# Patient Record
Sex: Male | Born: 1950 | Race: White | Hispanic: No | Marital: Single | Smoking: Former smoker
Health system: Southern US, Community
[De-identification: ages and names within clinical notes are randomized; demographics above are authoritative.]

## PROBLEM LIST (undated history)

## (undated) DIAGNOSIS — F319 Bipolar disorder, unspecified: Secondary | ICD-10-CM

## (undated) DIAGNOSIS — N179 Acute kidney failure, unspecified: Secondary | ICD-10-CM

## (undated) DIAGNOSIS — E785 Hyperlipidemia, unspecified: Secondary | ICD-10-CM

## (undated) DIAGNOSIS — E039 Hypothyroidism, unspecified: Secondary | ICD-10-CM

## (undated) HISTORY — DX: Bipolar disorder, unspecified: F31.9

## (undated) HISTORY — DX: Hypothyroidism, unspecified: E03.9

## (undated) HISTORY — DX: Acute kidney failure, unspecified: N17.9

## (undated) HISTORY — PX: OTHER SURGICAL HISTORY: SHX169

## (undated) HISTORY — DX: Hyperlipidemia, unspecified: E78.5

---

## 2005-03-17 ENCOUNTER — Inpatient Hospital Stay (HOSPITAL_COMMUNITY): Admission: EM | Admit: 2005-03-17 | Discharge: 2005-03-23 | Payer: Self-pay | Admitting: Emergency Medicine

## 2007-09-16 ENCOUNTER — Emergency Department (HOSPITAL_COMMUNITY): Admission: EM | Admit: 2007-09-16 | Discharge: 2007-09-16 | Payer: Self-pay | Admitting: Emergency Medicine

## 2009-12-27 ENCOUNTER — Inpatient Hospital Stay (HOSPITAL_COMMUNITY)
Admission: EM | Admit: 2009-12-27 | Discharge: 2010-01-13 | Payer: Self-pay | Source: Home / Self Care | Attending: Internal Medicine | Admitting: Internal Medicine

## 2009-12-27 ENCOUNTER — Ambulatory Visit: Payer: Self-pay | Admitting: Internal Medicine

## 2010-01-06 DIAGNOSIS — F3112 Bipolar disorder, current episode manic without psychotic features, moderate: Secondary | ICD-10-CM

## 2010-01-12 DIAGNOSIS — F05 Delirium due to known physiological condition: Secondary | ICD-10-CM

## 2010-02-16 ENCOUNTER — Encounter: Payer: Self-pay | Admitting: Internal Medicine

## 2010-03-10 ENCOUNTER — Encounter: Payer: Self-pay | Admitting: Internal Medicine

## 2010-03-31 ENCOUNTER — Encounter: Payer: Self-pay | Admitting: Internal Medicine

## 2010-04-20 LAB — URINALYSIS, ROUTINE W REFLEX MICROSCOPIC
Glucose, UA: NEGATIVE mg/dL
Hgb urine dipstick: NEGATIVE
Ketones, ur: NEGATIVE mg/dL
Protein, ur: NEGATIVE mg/dL

## 2010-04-20 LAB — GLUCOSE, CAPILLARY
Glucose-Capillary: 102 mg/dL — ABNORMAL HIGH (ref 70–99)
Glucose-Capillary: 102 mg/dL — ABNORMAL HIGH (ref 70–99)
Glucose-Capillary: 106 mg/dL — ABNORMAL HIGH (ref 70–99)
Glucose-Capillary: 107 mg/dL — ABNORMAL HIGH (ref 70–99)
Glucose-Capillary: 116 mg/dL — ABNORMAL HIGH (ref 70–99)
Glucose-Capillary: 116 mg/dL — ABNORMAL HIGH (ref 70–99)
Glucose-Capillary: 121 mg/dL — ABNORMAL HIGH (ref 70–99)
Glucose-Capillary: 137 mg/dL — ABNORMAL HIGH (ref 70–99)
Glucose-Capillary: 140 mg/dL — ABNORMAL HIGH (ref 70–99)
Glucose-Capillary: 158 mg/dL — ABNORMAL HIGH (ref 70–99)
Glucose-Capillary: 71 mg/dL (ref 70–99)
Glucose-Capillary: 82 mg/dL (ref 70–99)
Glucose-Capillary: 86 mg/dL (ref 70–99)

## 2010-04-20 LAB — LITHIUM LEVEL: Lithium Lvl: <0.25 mEq/L — ABNORMAL LOW (ref 0.80–1.40)

## 2010-04-20 LAB — BASIC METABOLIC PANEL
BUN: 37 mg/dL — ABNORMAL HIGH (ref 6–23)
CO2: 23 mEq/L (ref 19–32)
CO2: 25 mEq/L (ref 19–32)
Calcium: 9.5 mg/dL (ref 8.4–10.5)
Calcium: 9.5 mg/dL (ref 8.4–10.5)
Chloride: 109 mEq/L (ref 96–112)
Chloride: 111 mEq/L (ref 96–112)
Chloride: 114 mEq/L — ABNORMAL HIGH (ref 96–112)
Creatinine, Ser: 2.95 mg/dL — ABNORMAL HIGH (ref 0.4–1.5)
Creatinine, Ser: 3.41 mg/dL — ABNORMAL HIGH (ref 0.4–1.5)
GFR calc Af Amer: 26 mL/min — ABNORMAL LOW (ref >60)
GFR calc Af Amer: 26 mL/min — ABNORMAL LOW (ref >60)
GFR calc Af Amer: 27 mL/min — ABNORMAL LOW (ref >60)
GFR calc non Af Amer: 19 mL/min — ABNORMAL LOW (ref >60)
Glucose, Bld: 123 mg/dL — ABNORMAL HIGH (ref 70–99)
Potassium: 4.2 mEq/L (ref 3.5–5.1)
Sodium: 144 mEq/L (ref 135–145)

## 2010-04-20 LAB — CBC
HCT: 43.9 % (ref 39.0–52.0)
Hemoglobin: 12.5 g/dL — ABNORMAL LOW (ref 13.0–17.0)
Hemoglobin: 13.4 g/dL (ref 13.0–17.0)
MCH: 29.2 pg (ref 26.0–34.0)
MCH: 29.7 pg (ref 26.0–34.0)
MCHC: 30.2 g/dL (ref 30.0–36.0)
MCHC: 30.5 g/dL (ref 30.0–36.0)
MCHC: 31 g/dL (ref 30.0–36.0)
MCHC: 31.8 g/dL (ref 30.0–36.0)
MCV: 93.3 fL (ref 78.0–100.0)
MCV: 94 fL (ref 78.0–100.0)
MCV: 96.6 fL (ref 78.0–100.0)
Platelets: 146 10*3/uL — ABNORMAL LOW (ref 150–400)
Platelets: 175 10*3/uL (ref 150–400)
RBC: 4.21 MIL/uL — ABNORMAL LOW (ref 4.22–5.81)
RBC: 4.58 MIL/uL (ref 4.22–5.81)
RDW: 14 % (ref 11.5–15.5)
RDW: 14.3 % (ref 11.5–15.5)
WBC: 5.7 10*3/uL (ref 4.0–10.5)
WBC: 9.6 10*3/uL (ref 4.0–10.5)

## 2010-04-20 LAB — COMPREHENSIVE METABOLIC PANEL
ALT: 43 U/L (ref 0–53)
AST: 25 U/L (ref 0–37)
Albumin: 2.6 g/dL — ABNORMAL LOW (ref 3.5–5.2)
Alkaline Phosphatase: 109 U/L (ref 39–117)
BUN: 21 mg/dL (ref 6–23)
CO2: 21 mEq/L (ref 19–32)
Calcium: 9.1 mg/dL (ref 8.4–10.5)
Calcium: 9.6 mg/dL (ref 8.4–10.5)
Creatinine, Ser: 3.06 mg/dL — ABNORMAL HIGH (ref 0.4–1.5)
GFR calc Af Amer: 25 mL/min — ABNORMAL LOW (ref >60)
GFR calc non Af Amer: 20 mL/min — ABNORMAL LOW (ref >60)
Potassium: 4.3 mEq/L (ref 3.5–5.1)
Sodium: 144 mEq/L (ref 135–145)
Total Bilirubin: 0.8 mg/dL (ref 0.3–1.2)
Total Protein: 5.3 g/dL — ABNORMAL LOW (ref 6.0–8.3)

## 2010-04-21 ENCOUNTER — Encounter: Payer: Self-pay | Admitting: Internal Medicine

## 2010-04-21 ENCOUNTER — Inpatient Hospital Stay (INDEPENDENT_AMBULATORY_CARE_PROVIDER_SITE_OTHER): Payer: Medicare Other | Admitting: Internal Medicine

## 2010-04-21 DIAGNOSIS — N179 Acute kidney failure, unspecified: Secondary | ICD-10-CM | POA: Insufficient documentation

## 2010-04-21 DIAGNOSIS — R404 Transient alteration of awareness: Secondary | ICD-10-CM | POA: Insufficient documentation

## 2010-04-21 DIAGNOSIS — J9600 Acute respiratory failure, unspecified whether with hypoxia or hypercapnia: Secondary | ICD-10-CM

## 2010-04-21 DIAGNOSIS — Z87891 Personal history of nicotine dependence: Secondary | ICD-10-CM

## 2010-04-21 LAB — BLOOD GAS, ARTERIAL
Acid-Base Excess: 0.2 mmol/L (ref 0.0–2.0)
Acid-Base Excess: 2.9 mmol/L — ABNORMAL HIGH (ref 0.0–2.0)
Acid-Base Excess: 3.5 mmol/L — ABNORMAL HIGH (ref 0.0–2.0)
Bicarbonate: 24.1 mEq/L — ABNORMAL HIGH (ref 20.0–24.0)
Bicarbonate: 25.7 mEq/L — ABNORMAL HIGH (ref 20.0–24.0)
Bicarbonate: 25.9 mEq/L — ABNORMAL HIGH (ref 20.0–24.0)
Bicarbonate: 27.8 mEq/L — ABNORMAL HIGH (ref 20.0–24.0)
Bicarbonate: 28.5 mEq/L — ABNORMAL HIGH (ref 20.0–24.0)
Bicarbonate: 30.2 mEq/L — ABNORMAL HIGH (ref 20.0–24.0)
Bicarbonate: 30.4 mEq/L — ABNORMAL HIGH (ref 20.0–24.0)
Drawn by: 21338
Drawn by: 213381
Drawn by: 30996
FIO2: 0.4 %
FIO2: 0.4 %
FIO2: 1 %
MECHVT: 500 mL
Mode: POSITIVE
O2 Content: 3 L/min
O2 Content: 4 L/min
O2 Content: 6 L/min
O2 Saturation: 96.3 %
O2 Saturation: 96.4 %
O2 Saturation: 96.9 %
O2 Saturation: 99.8 %
O2 Saturation: 99.9 %
PEEP: 5 cmH2O
PEEP: 5 cmH2O
Patient temperature: 100.8
Patient temperature: 98.6
Patient temperature: 98.6
Patient temperature: 98.6
Patient temperature: 98.6
Patient temperature: 98.6
Patient temperature: 98.6
Patient temperature: 98.6
Pressure support: 5 cmH2O
RATE: 20 resp/min
TCO2: 23.3 mmol/L (ref 0–100)
TCO2: 23.7 mmol/L (ref 0–100)
TCO2: 25.2 mmol/L (ref 0–100)
TCO2: 26.7 mmol/L (ref 0–100)
TCO2: 27.1 mmol/L (ref 0–100)
TCO2: 27.4 mmol/L (ref 0–100)
pCO2 arterial: 86.7 mmHg (ref 35.0–45.0)
pH, Arterial: 7.132 — CL (ref 7.350–7.450)
pH, Arterial: 7.225 — ABNORMAL LOW (ref 7.350–7.450)
pH, Arterial: 7.317 — ABNORMAL LOW (ref 7.350–7.450)
pH, Arterial: 7.343 — ABNORMAL LOW (ref 7.350–7.450)
pH, Arterial: 7.348 — ABNORMAL LOW (ref 7.350–7.450)
pH, Arterial: 7.378 (ref 7.350–7.450)
pH, Arterial: 7.396 (ref 7.350–7.450)
pO2, Arterial: 101 mmHg — ABNORMAL HIGH (ref 80.0–100.0)
pO2, Arterial: 377 mmHg — ABNORMAL HIGH (ref 80.0–100.0)
pO2, Arterial: 86.5 mmHg (ref 80.0–100.0)
pO2, Arterial: 92.9 mmHg (ref 80.0–100.0)
pO2, Arterial: 93.8 mmHg (ref 80.0–100.0)

## 2010-04-21 LAB — BASIC METABOLIC PANEL
BUN: 28 mg/dL — ABNORMAL HIGH (ref 6–23)
BUN: 51 mg/dL — ABNORMAL HIGH (ref 6–23)
BUN: 51 mg/dL — ABNORMAL HIGH (ref 6–23)
CO2: 28 mEq/L (ref 19–32)
CO2: 28 mEq/L (ref 19–32)
Calcium: 8.8 mg/dL (ref 8.4–10.5)
Calcium: 9.5 mg/dL (ref 8.4–10.5)
Chloride: 111 mEq/L (ref 96–112)
Chloride: 119 mEq/L — ABNORMAL HIGH (ref 96–112)
Chloride: 121 mEq/L — ABNORMAL HIGH (ref 96–112)
Chloride: 127 mEq/L — ABNORMAL HIGH (ref 96–112)
Creatinine, Ser: 3.34 mg/dL — ABNORMAL HIGH (ref 0.4–1.5)
Creatinine, Ser: 3.54 mg/dL — ABNORMAL HIGH (ref 0.4–1.5)
GFR calc Af Amer: 20 mL/min — ABNORMAL LOW (ref >60)
GFR calc Af Amer: 22 mL/min — ABNORMAL LOW (ref >60)
GFR calc Af Amer: 28 mL/min — ABNORMAL LOW (ref >60)
GFR calc Af Amer: 29 mL/min — ABNORMAL LOW (ref >60)
GFR calc non Af Amer: 16 mL/min — ABNORMAL LOW (ref >60)
GFR calc non Af Amer: 16 mL/min — ABNORMAL LOW (ref >60)
GFR calc non Af Amer: 19 mL/min — ABNORMAL LOW (ref >60)
GFR calc non Af Amer: 20 mL/min — ABNORMAL LOW (ref >60)
GFR calc non Af Amer: 24 mL/min — ABNORMAL LOW (ref >60)
GFR calc non Af Amer: 26 mL/min — ABNORMAL LOW (ref >60)
Glucose, Bld: 112 mg/dL — ABNORMAL HIGH (ref 70–99)
Glucose, Bld: 182 mg/dL — ABNORMAL HIGH (ref 70–99)
Glucose, Bld: 236 mg/dL — ABNORMAL HIGH (ref 70–99)
Potassium: 3.3 mEq/L — ABNORMAL LOW (ref 3.5–5.1)
Potassium: 3.8 mEq/L (ref 3.5–5.1)
Potassium: 3.9 mEq/L (ref 3.5–5.1)
Potassium: 4.1 mEq/L (ref 3.5–5.1)
Potassium: 4.2 mEq/L (ref 3.5–5.1)
Potassium: 4.6 mEq/L (ref 3.5–5.1)
Sodium: 146 mEq/L — ABNORMAL HIGH (ref 135–145)
Sodium: 147 mEq/L — ABNORMAL HIGH (ref 135–145)
Sodium: 148 mEq/L — ABNORMAL HIGH (ref 135–145)
Sodium: 151 mEq/L — ABNORMAL HIGH (ref 135–145)
Sodium: 155 mEq/L — ABNORMAL HIGH (ref 135–145)
Sodium: 161 mEq/L (ref 135–145)

## 2010-04-21 LAB — COMPREHENSIVE METABOLIC PANEL
ALT: 22 U/L (ref 0–53)
ALT: 81 U/L — ABNORMAL HIGH (ref 0–53)
AST: 15 U/L (ref 0–37)
Albumin: 2.8 g/dL — ABNORMAL LOW (ref 3.5–5.2)
Albumin: 3.1 g/dL — ABNORMAL LOW (ref 3.5–5.2)
Alkaline Phosphatase: 107 U/L (ref 39–117)
BUN: 31 mg/dL — ABNORMAL HIGH (ref 6–23)
BUN: 34 mg/dL — ABNORMAL HIGH (ref 6–23)
BUN: 55 mg/dL — ABNORMAL HIGH (ref 6–23)
CO2: 25 mEq/L (ref 19–32)
CO2: 31 mEq/L (ref 19–32)
Calcium: 9.1 mg/dL (ref 8.4–10.5)
Calcium: 9.9 mg/dL (ref 8.4–10.5)
Chloride: 107 mEq/L (ref 96–112)
Chloride: 108 mEq/L (ref 96–112)
Chloride: 113 mEq/L — ABNORMAL HIGH (ref 96–112)
Creatinine, Ser: 3.01 mg/dL — ABNORMAL HIGH (ref 0.4–1.5)
Creatinine, Ser: 3.22 mg/dL — ABNORMAL HIGH (ref 0.4–1.5)
Creatinine, Ser: 3.29 mg/dL — ABNORMAL HIGH (ref 0.4–1.5)
GFR calc Af Amer: 24 mL/min — ABNORMAL LOW (ref >60)
GFR calc Af Amer: 26 mL/min — ABNORMAL LOW (ref >60)
GFR calc non Af Amer: 20 mL/min — ABNORMAL LOW (ref >60)
GFR calc non Af Amer: 22 mL/min — ABNORMAL LOW (ref >60)
Glucose, Bld: 108 mg/dL — ABNORMAL HIGH (ref 70–99)
Glucose, Bld: 114 mg/dL — ABNORMAL HIGH (ref 70–99)
Glucose, Bld: 120 mg/dL — ABNORMAL HIGH (ref 70–99)
Potassium: 5 mEq/L (ref 3.5–5.1)
Sodium: 147 mEq/L — ABNORMAL HIGH (ref 135–145)
Sodium: 149 mEq/L — ABNORMAL HIGH (ref 135–145)
Total Bilirubin: 0.3 mg/dL (ref 0.3–1.2)
Total Bilirubin: 0.7 mg/dL (ref 0.3–1.2)
Total Bilirubin: 1 mg/dL (ref 0.3–1.2)

## 2010-04-21 LAB — POCT CARDIAC MARKERS
CKMB, poc: 4.3 ng/mL (ref 1.0–8.0)
CKMB, poc: 5 ng/mL (ref 1.0–8.0)
Myoglobin, poc: 300 ng/mL (ref 12–200)
Troponin i, poc: <0.05 ng/mL (ref 0.00–0.09)
Troponin i, poc: <0.05 ng/mL (ref 0.00–0.09)

## 2010-04-21 LAB — GLUCOSE, CAPILLARY
Glucose-Capillary: 104 mg/dL — ABNORMAL HIGH (ref 70–99)
Glucose-Capillary: 114 mg/dL — ABNORMAL HIGH (ref 70–99)
Glucose-Capillary: 117 mg/dL — ABNORMAL HIGH (ref 70–99)
Glucose-Capillary: 118 mg/dL — ABNORMAL HIGH (ref 70–99)
Glucose-Capillary: 120 mg/dL — ABNORMAL HIGH (ref 70–99)
Glucose-Capillary: 122 mg/dL — ABNORMAL HIGH (ref 70–99)
Glucose-Capillary: 125 mg/dL — ABNORMAL HIGH (ref 70–99)
Glucose-Capillary: 128 mg/dL — ABNORMAL HIGH (ref 70–99)
Glucose-Capillary: 129 mg/dL — ABNORMAL HIGH (ref 70–99)
Glucose-Capillary: 129 mg/dL — ABNORMAL HIGH (ref 70–99)
Glucose-Capillary: 147 mg/dL — ABNORMAL HIGH (ref 70–99)
Glucose-Capillary: 150 mg/dL — ABNORMAL HIGH (ref 70–99)
Glucose-Capillary: 152 mg/dL — ABNORMAL HIGH (ref 70–99)
Glucose-Capillary: 162 mg/dL — ABNORMAL HIGH (ref 70–99)
Glucose-Capillary: 167 mg/dL — ABNORMAL HIGH (ref 70–99)
Glucose-Capillary: 176 mg/dL — ABNORMAL HIGH (ref 70–99)
Glucose-Capillary: 199 mg/dL — ABNORMAL HIGH (ref 70–99)
Glucose-Capillary: 201 mg/dL — ABNORMAL HIGH (ref 70–99)
Glucose-Capillary: 206 mg/dL — ABNORMAL HIGH (ref 70–99)
Glucose-Capillary: 233 mg/dL — ABNORMAL HIGH (ref 70–99)
Glucose-Capillary: 238 mg/dL — ABNORMAL HIGH (ref 70–99)
Glucose-Capillary: 256 mg/dL — ABNORMAL HIGH (ref 70–99)
Glucose-Capillary: 290 mg/dL — ABNORMAL HIGH (ref 70–99)
Glucose-Capillary: 343 mg/dL — ABNORMAL HIGH (ref 70–99)
Glucose-Capillary: 382 mg/dL — ABNORMAL HIGH (ref 70–99)
Glucose-Capillary: 76 mg/dL (ref 70–99)
Glucose-Capillary: 79 mg/dL (ref 70–99)
Glucose-Capillary: 84 mg/dL (ref 70–99)
Glucose-Capillary: 86 mg/dL (ref 70–99)
Glucose-Capillary: 90 mg/dL (ref 70–99)
Glucose-Capillary: 93 mg/dL (ref 70–99)

## 2010-04-21 LAB — URINE CULTURE: Culture  Setup Time: 201111200243

## 2010-04-21 LAB — CBC
HCT: 36.8 % — ABNORMAL LOW (ref 39.0–52.0)
HCT: 39.7 % (ref 39.0–52.0)
HCT: 40 % (ref 39.0–52.0)
HCT: 40.1 % (ref 39.0–52.0)
HCT: 40.4 % (ref 39.0–52.0)
HCT: 40.6 % (ref 39.0–52.0)
HCT: 43.5 % (ref 39.0–52.0)
Hemoglobin: 12.1 g/dL — ABNORMAL LOW (ref 13.0–17.0)
Hemoglobin: 12.5 g/dL — ABNORMAL LOW (ref 13.0–17.0)
Hemoglobin: 12.7 g/dL — ABNORMAL LOW (ref 13.0–17.0)
Hemoglobin: 12.7 g/dL — ABNORMAL LOW (ref 13.0–17.0)
Hemoglobin: 13 g/dL (ref 13.0–17.0)
Hemoglobin: 13 g/dL (ref 13.0–17.0)
Hemoglobin: 13.1 g/dL (ref 13.0–17.0)
Hemoglobin: 13.9 g/dL (ref 13.0–17.0)
MCH: 29.3 pg (ref 26.0–34.0)
MCH: 29.7 pg (ref 26.0–34.0)
MCH: 29.8 pg (ref 26.0–34.0)
MCH: 30.3 pg (ref 26.0–34.0)
MCHC: 32 g/dL (ref 30.0–36.0)
MCHC: 32 g/dL (ref 30.0–36.0)
MCHC: 32.4 g/dL (ref 30.0–36.0)
MCHC: 32.5 g/dL (ref 30.0–36.0)
MCHC: 33.2 g/dL (ref 30.0–36.0)
MCV: 91.8 fL (ref 78.0–100.0)
MCV: 92.4 fL (ref 78.0–100.0)
MCV: 92.9 fL (ref 78.0–100.0)
MCV: 93.5 fL (ref 78.0–100.0)
MCV: 93.8 fL (ref 78.0–100.0)
MCV: 94.4 fL (ref 78.0–100.0)
Platelets: 168 10*3/uL (ref 150–400)
Platelets: 169 10*3/uL (ref 150–400)
Platelets: ADEQUATE 10*3/uL (ref 150–400)
RBC: 4.07 MIL/uL — ABNORMAL LOW (ref 4.22–5.81)
RBC: 4.2 MIL/uL — ABNORMAL LOW (ref 4.22–5.81)
RBC: 4.28 MIL/uL (ref 4.22–5.81)
RBC: 4.28 MIL/uL (ref 4.22–5.81)
RBC: 4.34 MIL/uL (ref 4.22–5.81)
RBC: 4.34 MIL/uL (ref 4.22–5.81)
RBC: 4.74 MIL/uL (ref 4.22–5.81)
RDW: 14.5 % (ref 11.5–15.5)
RDW: 15.6 % — ABNORMAL HIGH (ref 11.5–15.5)
WBC: 8.1 10*3/uL (ref 4.0–10.5)
WBC: 8.3 10*3/uL (ref 4.0–10.5)
WBC: 9.2 10*3/uL (ref 4.0–10.5)
WBC: 9.2 10*3/uL (ref 4.0–10.5)

## 2010-04-21 LAB — CREATININE, URINE, RANDOM: Creatinine, Urine: 38.1 mg/dL

## 2010-04-21 LAB — LEGIONELLA ANTIGEN, URINE

## 2010-04-21 LAB — CLOSTRIDIUM DIFFICILE EIA

## 2010-04-21 LAB — DIFFERENTIAL
Basophils Absolute: 0 10*3/uL (ref 0.0–0.1)
Lymphocytes Relative: 8 % — ABNORMAL LOW (ref 12–46)
Lymphs Abs: 1.2 10*3/uL (ref 0.7–4.0)
Neutrophils Relative %: 81 % — ABNORMAL HIGH (ref 43–77)

## 2010-04-21 LAB — AMYLASE: Amylase: 149 U/L — ABNORMAL HIGH (ref 0–105)

## 2010-04-21 LAB — PROTIME-INR
INR: 1.41 (ref 0.00–1.49)
Prothrombin Time: 17.5 seconds — ABNORMAL HIGH (ref 11.6–15.2)

## 2010-04-21 LAB — ACETAMINOPHEN LEVEL: Acetaminophen (Tylenol), Serum: <10 ug/mL — ABNORMAL LOW (ref 10–30)

## 2010-04-21 LAB — CULTURE, BLOOD (ROUTINE X 2)

## 2010-04-21 LAB — URINALYSIS, ROUTINE W REFLEX MICROSCOPIC
Bilirubin Urine: NEGATIVE
Glucose, UA: NEGATIVE mg/dL
Ketones, ur: NEGATIVE mg/dL
Leukocytes, UA: NEGATIVE
Specific Gravity, Urine: 1.01 (ref 1.005–1.030)
pH: 6.5 (ref 5.0–8.0)

## 2010-04-21 LAB — LITHIUM LEVEL
Lithium Lvl: 0.79 mEq/L — ABNORMAL LOW (ref 0.80–1.40)
Lithium Lvl: 0.81 mEq/L (ref 0.80–1.40)
Lithium Lvl: 1.07 mEq/L (ref 0.80–1.40)

## 2010-04-21 LAB — CULTURE, BAL-QUANTITATIVE W GRAM STAIN: Colony Count: 1000

## 2010-04-21 LAB — TSH: TSH: 0.325 u[IU]/mL — ABNORMAL LOW (ref 0.350–4.500)

## 2010-04-21 LAB — MRSA PCR SCREENING: MRSA by PCR: NEGATIVE

## 2010-04-21 LAB — PHOSPHORUS
Phosphorus: 4.5 mg/dL (ref 2.3–4.6)
Phosphorus: 6.3 mg/dL — ABNORMAL HIGH (ref 2.3–4.6)

## 2010-04-21 LAB — OSMOLALITY: Osmolality: 315 mOsm/kg — ABNORMAL HIGH (ref 275–300)

## 2010-04-21 LAB — MAGNESIUM: Magnesium: 2.7 mg/dL — ABNORMAL HIGH (ref 1.5–2.5)

## 2010-04-21 LAB — BRAIN NATRIURETIC PEPTIDE: Pro B Natriuretic peptide (BNP): <30 pg/mL (ref 0.0–100.0)

## 2010-04-21 LAB — RAPID URINE DRUG SCREEN, HOSP PERFORMED: Tetrahydrocannabinol: NOT DETECTED

## 2010-04-28 NOTE — Assessment & Plan Note (Signed)
Summary: Bradley Tran   Visit Type:  Hospital Follow-up Primary Provider/Referring Provider:  Jetty Duhamel  CC:  HFU. Pt states his breathing has been doing "good" since starting the proventile. Pt denies any cough. Pt states overall he feels fine.Bradley Tran  History of Present Illness: April 22, 3152 60 year old heavey ex smoker, schizophrenic, and vet. Admitted in ICU nov 2011 with acute hypercapnic resp failure (? AECOPD v pna) and course complicated by renal failure and delirum. Post discharge followed with psych at [facility] and meds adjusted and feels best ever. Renal function last followe dby PMD  and creat was 2.2 (baseline 1.5mg % in 2009, peak creat in hispital 2.5mg %) which is an improvement from discharge but not at baeline. Currently in pulm clinic to followup ICU stay. He feels well. Denies cough, dyspnea, chest pain, wheezing, edema.   Preventive Screening-Counseling & Management  Alcohol-Tobacco     Smoking Status: quit     Smoking Cessation Counseling: yes     Smoke Cessation Stage: quit     Packs/Day: 4.0     Year Started: 1975     Year Quit: 2010     Tobacco Counseling: not to resume use of tobacco products  Allergies (verified): No Known Drug Allergies  Past History:  Past medical, surgical, family and social histories (including risk factors) reviewed, and no changes noted (except as noted below).  Past Medical History: bipolar disease acute renal failure acute respiratory failure hypothyroidism hyperlipidemia Diabetes  Past Surgical History: lymph node removed from right arm  Family History: Reviewed history and no changes required. no family history he is aware of  Social History: Reviewed history from 04/20/2010 and no changes required. Patient states former smoker. quit Jun 21, 2008. 4 ppd. started age 75 single occupation: retired Cabin crew Packs/Day:  4.0  Review of Systems  The patient denies shortness of breath with activity, shortness of  breath at rest, productive cough, non-productive cough, coughing up blood, chest pain, irregular heartbeats, acid heartburn, indigestion, loss of appetite, weight change, abdominal pain, difficulty swallowing, sore throat, tooth/dental problems, headaches, nasal congestion/difficulty breathing through nose, sneezing, itching, ear ache, anxiety, depression, hand/feet swelling, joint stiffness or pain, rash, change in color of mucus, and fever.    Vital Signs:  Patient profile:   60 year old male Height:      64 inches Weight:      154.50 pounds BMI:     26.62 O2 Sat:      93 % on Room air Temp:     99.1 degrees F oral Pulse rate:   113 / minute BP sitting:   106 / 62  (right arm) Cuff size:   large  Vitals Entered By: Charma Igo (April 21, 2010 3:20 PM)  O2 Flow:  Room air CC: HFU. Pt states his breathing has been doing "good" since starting the proventile. Pt denies any cough. Pt states overall he feels fine. Comments meds and allergies updated Mindy Silva  April 21, 2010 3:20 PM    Physical Exam  General:  well developed, well nourished, in no acute distress Head:  normocephalic and atraumatic Eyes:  PERRLA/EOM intact; conjunctiva and sclera clear Ears:  TMs intact and clear with normal canals Nose:  no deformity, discharge, inflammation, or lesions Mouth:  no deformity or lesions Neck:  no masses, thyromegaly, or abnormal cervical nodes Chest Wall:  no deformities noted Lungs:  clear bilaterally to auscultation and percussion Heart:  regular rate and rhythm, S1, S2 without murmurs, rubs,  gallops, or clicks Abdomen:  bowel sounds positive; abdomen soft and non-tender without masses, or organomegaly Msk:  no deformity or scoliosis noted with normal posture Pulses:  pulses normal Extremities:  no clubbing, cyanosis, edema, or deformity noted Neurologic:  CN II-XII grossly intact with normal reflexes, coordination, muscle strength and tone Skin:  intact without lesions or  rashes Cervical Nodes:  no significant adenopathy Axillary Nodes:  no significant adenopathy Psych:  pleasant anxious oral tics of tongue +   CXR  Procedure date:  01/08/2010  Findings:       Clinical Data: Shortness of breath.  Respiratory failure.    PORTABLE CHEST - 1 VIEW    Comparison: 01/05/2010    Findings: Interval removal of feeding tube and endotracheal tube.   Chin overlies the apices minimally.  Minimal motion degradation.   Mild cardiomegaly.  Mild right hemidiaphragm elevation. No pleural   effusion or pneumothorax.  Lung volumes are low.  Improved   bibasilar aeration with mild right base subsegmental atelectasis   remaining.    IMPRESSION:   Cardiomegaly with improved bibasilar aeration.  Mild right base   atelectasis remains.  Left-sided pleural effusion has resolved.    Read By:  Areta Haber,  M.D.   Released By:  Areta Haber,  M.D.  Additional Information  HL7 RESULT STATUS : F  Comments:      independently reviewed  Impression & Recommendations:  Problem # 1:  TOBACCO ABUSE-HISTORY OF (ICD-V15.82) Assessment Unchanged heavy exsmoker. asymptomatic   plan pft to rule out copd Orders: Pulmonary Referral (Pulmonary) Est. Patient Level III [id number])  Problem # 2:  RENAL FAILURE, ACUTE (ICD-584.9) Assessment: Improved  improved from nov 2011 but not at baseline yet. PMD following  Orders: Est. Patient Level III [id number])  Problem # 3:  DELIRIUM (ICD-780.09) Assessment: Improved  resolved. psych at [facility] has adjusted his meds. they are following  Orders: Est. Patient Level III [id number])  Problem # 4:  ACUTE RESPIRATORY FAILURE (ICD-518.81) Assessment: Improved  resolved  plan pft to see if he has copd  Orders: Est. Patient Level III [id number])  Medications Added to Medication List This Visit: 1)  Oxygen 3 Liters  .... At night 2)  Proventil Hfa 108 (90 Base) Mcg/act Aers (Albuterol sulfate) .... 2 puffs at  bedtime  Patient Instructions: 1)  please have breathin test called PFT 2)  return for folowup after breathing test 3)  glad you are better   Immunization History:  Influenza Immunization History:    Influenza:  historical (03/11/2010)  Pneumovax Immunization History:    Pneumovax:  historical (03/12/2010)

## 2010-05-07 ENCOUNTER — Encounter: Payer: Self-pay | Admitting: Internal Medicine

## 2010-05-07 NOTE — Letter (Signed)
Summary: [facility]   Imported By: Phillis Knack 04/27/2010 07:25:24  _____________________________________________________________________  External Attachment:    Type:   Image     Comment:   External Document

## 2010-05-07 NOTE — Letter (Signed)
Summary: [facility]   Imported By: Phillis Knack 04/27/2010 07:24:48  _____________________________________________________________________  External Attachment:    Type:   Image     Comment:   External Document

## 2010-05-07 NOTE — Letter (Signed)
Summary: [facility]   Imported By: Phillis Knack 04/27/2010 07:26:01  _____________________________________________________________________  External Attachment:    Type:   Image     Comment:   External Document

## 2010-05-12 ENCOUNTER — Ambulatory Visit: Payer: Medicare Other | Admitting: Internal Medicine

## 2010-06-26 NOTE — Discharge Summary (Signed)
NAME:  Bradley Tran, Bradley Tran NO.:  [id number]   MEDICAL RECORD NO.:  [id number]          PATIENT TYPE:  INP   LOCATION:  [id number]                         FACILITY:  [facility]   PHYSICIAN:  Cherene Altes, M.D.DATE OF BIRTH:  [birth year]   DATE OF ADMISSION:  03/17/2005  DATE OF DISCHARGE:  03/22/2005                                 DISCHARGE SUMMARY   PRIMARY CARE PHYSICIAN:  Unassigned.   PSYCHIATRIC PHYSICIAN:  Engineer, technical sales, [place],  [place].   DISCHARGE DIAGNOSES:  1.  Lithium toxicity.      1.  Lithium level 3.45 at admission.      2.  Lithium level 0.77 at discharge.  2.  Bipolar disorder.  3.  Normocytic anemia, blood work unrevealing.  4.  Severe dehydration with acute renal insufficiency, resolved.   OUTPATIENT MEDICATIONS:  1.  Lithium 900 mg p.o. nightly.  2.  Risperdal 3 mg p.o. twice daily.  3.  Celexa 20 mg p.o. daily.   FOLLOW UP:  The patient is instructed to keep his scheduled appointment with  his attending psychiatrist at the [facility] in [place].  He is also advised  to obtain a primary care physician for ongoing evaluation of chronic medical  problems.   CONSULTATIONS:  Felizardo Hoffmann, M.D., inpatient psychiatric services.   PROCEDURES:  CT scan of the head March 17, 2005, revealing no definite  acute or focal intracranial abnormalities.   HISTORY OF PRESENT ILLNESS:  Please see dictated H&P labeled # [id number].   HOSPITAL COURSE:  [name] is a very pleasant 60 year old gentleman  with longstanding history of bipolar disorder.  He has been on lithium for  quite some time.  He presented to the hospital on March 17, 2005, with  complaints of altered mental status.  The patient has an elderly father who  assists with his care.  He had noted significant change in the patient's  mental status in the 2 to 3 days prior to his admission.  The patient had  come to a point where he would finally admit that  he was having difficulty,  and, therefore, the patient's father was able to convince him to present to  the emergency room for evaluation.   In the emergency room, he underwent CT scan of the head which revealed no  acute disease.  Lithium level was obtained which was noted to be 3.45.  The  patient was exhibiting symptoms of confusion, lethargy, somnolence, and  tremulousness.  He was also having some nausea, vomiting, and diarrhea.  All  symptoms were felt to be related to the patient's lithium toxicity.  The  patient was admitted to the acute unit.  Lithium was held, of course.  The  patient was hydrated with crystalloid IV fluids.  The patient tolerated the  intervention well.  With gradual decreasing lithium levels, the patient's  mental status improved significantly.  By March 21, 2005, the patient had  returned to his baseline mental status. A consultation was accomplished with  inpatient psychiatry.  A recommendation was made to resume the patient's  lithium at 900 mg nightly and for levels to be obtained 6 days status post  resumption, 10 hours after the patient's dose.  Arrangements are being made  for this to be carried out in the outpatient setting.  The patient is also  scheduled to follow up with his primary psychiatrist the Tuesday following  his discharge (discharge being planned for Monday).   At time of discharge, however, the patient's symptoms had completely  resolved, and he was back to his baseline.  He is alert and oriented and  ambulating without difficulty.   During this hospital stay, it was noted the patient was suffering with a  mild normocytic anemia. The exact etiology of this was not clear.  Stool  guaiac's were ordered but not obtained during the hospital stay.  B12 and  folate levels were obtained and are both in fact normal to high-normal.  Iron panel was obtained and was completely normal.  In that there is no  evidence of an acute iron deficiency,  it is not felt hospitalization should  be prolonged any further to evaluate this.  Given the patient's iron studies  do not suggest iron deficiency, it is not felt an acute colonoscopy would be  indicated.   The patient did have a significant degree of renal insufficiency at time of  his presentation.  It is quite possible the patient has had prerenal  azotemia in the outpatient setting long enough to cause decreased  erythropoietin state causing some mild anemia.  It is recommended the  patient's CBC be followed up in approximately one month.   At the time of admission, the patient was clinically significantly  dehydrated and suffering with acute renal insufficiency.  Creatinine was 1.9  at the time of admission. Baseline creatinine was unknown.  With hydration,  fortunately the patient's renal function improved and returned to normal.  Creatinine at the time of discharge was 1.5.   On March 22, 2005, the patient was cleared for discharge home.  He has  discharge prescription for all of the above-listed medications.  He is  already scheduled to follow up with his primary psychiatrist in [place] at the [facility] and is advised to do so.  The patient's father will assist  him in keeping that appointment.  Case management is also to assist the  patient in obtaining a clinic locally where he can have his lithium level  checked on a regular basis.      Cherene Altes, M.D.  Electronically Signed     JTM/MEDQ  D:  03/21/2005  T:  03/21/2005  Job:  [id number]

## 2010-06-26 NOTE — Discharge Summary (Signed)
NAME:  Bradley Tran, Bradley Tran NO.:  [id number]   MEDICAL RECORD NO.:  [id number]          PATIENT TYPE:  INP   LOCATION:  [id number]                         [facility]ACILITY:  [facility]   PHYSICIAN:  Cyril Mourning, D.O.    DATE OF BIRTH:  Feb 07, 1951   DATE OF ADMISSION:  03/17/2005  DATE OF DISCHARGE:                                 DISCHARGE SUMMARY   ADDENDUM  For full details of hospital course and summary, please see the discharge  summary dictated by Dr. 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 His urine output has been  almost 5 L per day. He is keeping up with oral replacement, however. In any  event, I would recommend weekly follow-up of basic metabolic panel to assure  that he is hydrating appropriately until his diabetes insipidus resolves  over a matter of time.   Additionally he was discovered to have a markedly elevated TSH of 119  perhaps also related to Lithium toxicity that can be associated with  hypothyroidism.  He has been initiated on Synthroid 32mcg daily with  recommendations to follow up his TSH within 1 month of discharge.   He appears to be doing quite well clinically ambulating and showering on his  own and should he have any questions or problems about this hospitalization  he has been provided our contact information.   LABORATORY DATA PRIOR TO DISCHARGE:  Includes a sodium of 142, potassium  4.4, BUN 10, creatinine of 1.6. Urine osmolarity of 155, fractional  excretion of sodium is greater than 1%.   In any event, again, Bradley Tran needs to follow up with a primary care  physician and have weekly basic metabolic panels until this issue resolves.  Otherwise, he has a high  likelihood for re-presentation to the hospital with  dehydration and acute renal failure.      Cyril Mourning, D.O.  Electronically Signed     ESS/MEDQ  D:  03/23/2005  T:  03/23/2005  Job:  [id number]   cc:   Felizardo Hoffmann, M.D.

## 2010-06-26 NOTE — H&P (Signed)
NAME:  Bradley Tran NO.:  [id number]   MEDICAL RECORD NO.:  [id number]          Tran TYPE:  EMS   LOCATION:  ED                           FACILITY:  [facility]   PHYSICIAN:  Cherene Altes, M.D.DATE OF BIRTH:  [birth year]   DATE OF ADMISSION:  03/17/2005  DATE OF DISCHARGE:                                HISTORY & PHYSICAL   PRIMARY CARE PHYSICIAN:  Unassigned.   CHIEF COMPLAINT:  Altered mental status.   HISTORY OF PRESENT ILLNESS:  Bradley Tran is a 60 year old gentleman with  a known history of bipolar disorder for which he is treated with Lithium.  Bradley Tran's elderly father assists in his care, though Bradley Tran lives  independently at [facility] in [place]. Bradley Tran's  father reports that Bradley Tran has been confused and shaky for at least 3  to 4 days.  He has been trying to convince his son to seek help.  Today Bradley  Tran's son felt sufficiently bad that he himself decided he did need some  help.  He called EMS.  At that time apparently he was saying, my sodium is  bad.  He reports through his father complaints of tremulousness, confusion,  slurred speech, stumbling gait, and intermittent diarrhea.  At Bradley time of  my evaluation, however, his speech is unintelligible.  Bradley Tran is  markedly somnolent.  Bradley Tran's father states that he has had these  symptoms for at least 2 to 3 days, but he has had them previously during  bouts of lithium toxicity.  No further history is able to be accomplished.   REVIEW OF SYSTEMS:  Comprehensive Review of Systems is not able to be  accomplished as Bradley Tran is significantly altered in his mental status.   PAST MEDICAL HISTORY:  1.  Bipolar disorder treated through [facility] in [place].  2.  No further medical history is able to be gleaned from Bradley Tran due to      his altered mental status.   MEDICATIONS:  1.  Lithium, dose unknown.  2.   Risperdal 3 mg p.o. twice daily.  3.  Celexa 20 mg p.o. q.a.m.  4.  Vardenafil 20 mg p.r.n.   ALLERGIES:  COGENTIN.   FAMILY HISTORY:  Noncontributory to this admission.   SOCIAL HISTORY:  Bradley Tran apparently does drink and does smoke, but Bradley  extent to which he does either is not clear.  He lives independently at  [facility].   DATA REVIEW:  White count is elevated at 15.7, hemoglobin low at 12.5, with  MCV of 97.  Electrolytes are balanced.  BUN is normal at 13, creatinine  elevated at 1.9.  LFTs are normal except alkaline phosphatase which is  elevated at 160. Lithium level is markedly elevated at 3.45.  Ammonia level  is normal.   Chest x-Bhat reveals no acute disease.   Urinalysis reveals trace leukocyte esterase with only 0 to 2 white blood  cells.  Coags are normal.  Alcohol level is less than  5.   CT scan of Bradley head reveals no acute disease.   Bradley pH was 7.4, pCO2 55, pO2 93.   PHYSICAL EXAMINATION:  VITAL SIGNS: Temperature 99.9, blood pressure 117/77,  heart rate 73, respiratory rate 20, O2 saturation 93% on room air.  CBG is  199.  GENERAL:  Disheveled, poorly kempt gentleman who is slurring his speech and  very lethargic but in no acute respiratory distress.  HEENT:  Eyelashes appear to be singed bilaterally, but there is no evidence  of cutaneous trauma or burn.  NECK:  No JVD.  LUNGS: Clear to auscultation bilaterally without wheeze or rhonchi.  CARDIOVASCULAR: Regular rate and rhythm without murmur, gallop, or rub.  ABDOMEN:  Nontender, nondistended.  Bowel sounds present.  No  hepatosplenomegaly, rebound, or ascites.  EXTREMITIES: No significant cyanosis, clubbing, or edema bilateral lower  extremities.  CUTANEOUS: Bradley Tran is very poorly kempt and is, in fact, quite dirty at  present.  Fingernails are nicotine stained bilaterally.  NEUROLOGIC: Cranial nerves II-XII do appear to be intact. Bradley Tran is  moving all four extremities spontaneously.   There is a slight tremor  appreciable with all attempts to move.  There is no posturing.  There is no  Babinski.  Bradley Tran is alert, lethargic, but is clearly not oriented.   IMPRESSION AND PLAN:  1.  Lithium toxicity: Bradley Tran is suffering with a significant lithium      toxicity.  He displays Bradley classic symptoms of tremor, twitch, ataxia,      delirium, nausea, vomiting, diarrhea, and leukocytosis.  Thus far, we      have been fortunate in that there has been no evidence of bradycardia,      hypotension, or seizure activity.  This has likely all been exacerbated      by Bradley Tran's renal insufficiency.  I am not aware of Bradley baseline      status of his renal function.  Bradley Tran will be admitted for close      observation and IV fluid administration.  We will keep him on seizure      precautions.  We will monitor him on telemetry out of fear of      bradycardia and hypotension.  At present, it does not appear that      hemodialysis will be necessary.  Will follow lithium level in Bradley      morning as well as renal function.  If his renal function has not      improved, we may, unfortunately, need to discontinue lithium      permanently.  2.  Renal insufficiency: I do not have baseline creatinine despite a search      of Bradley computer system.  Creatinine at present is 1.9.  I am hopeful      this is simply a mild prerenal azotemia.  We will hydrate Bradley Tran as      discussed above and follow up renal function in Bradley morning.  3.  Normocytic anemia: If Bradley Tran's renal function is chronically 1.9,      Bradley Tran's anemia may simply be a result of decreased erythropoietin      state.  I will check B12, folate, and iron studies as this could, in      fact, be a mixed anemia.  We will guaiac stools.  4.  Bipolar disorder: We can continue Bradley Tran's Risperdal, but we will      have to hold his  Lithium for clear reasons.     Cherene Altes, M.D.  Electronically  Signed    JTM/MEDQ  D:  03/17/2005  T:  03/17/2005  Job:  [id number]

## 2010-11-06 LAB — POCT I-STAT, CHEM 8
BUN: 9
Calcium, Ion: 1.29
Chloride: 103
HCT: 34 — ABNORMAL LOW
Potassium: 4.1

## 2010-11-06 LAB — DIFFERENTIAL
Basophils Absolute: 0
Basophils Relative: 0
Lymphocytes Relative: 11 — ABNORMAL LOW
Monocytes Absolute: 0.5
Monocytes Relative: 4
Neutro Abs: 9.3 — ABNORMAL HIGH
Neutrophils Relative %: 81 — ABNORMAL HIGH

## 2010-11-06 LAB — URINALYSIS, ROUTINE W REFLEX MICROSCOPIC
Protein, ur: NEGATIVE
Specific Gravity, Urine: 1.005
Urobilinogen, UA: 0.2

## 2010-11-06 LAB — CBC
Platelets: 248
RBC: 3.43 — ABNORMAL LOW
WBC: 11.6 — ABNORMAL HIGH

## 2011-02-08 ENCOUNTER — Encounter (HOSPITAL_COMMUNITY): Payer: Self-pay | Admitting: Emergency Medicine

## 2011-02-08 ENCOUNTER — Emergency Department (HOSPITAL_COMMUNITY)
Admission: EM | Admit: 2011-02-08 | Discharge: 2011-02-10 | Disposition: A | Discharge status: Other Acute Inpatient Hospital | Payer: Medicare Other | Attending: Emergency Medicine | Admitting: Emergency Medicine

## 2011-02-08 DIAGNOSIS — F29 Unspecified psychosis not due to a substance or known physiological condition: Secondary | ICD-10-CM | POA: Insufficient documentation

## 2011-02-08 DIAGNOSIS — Z79899 Other long term (current) drug therapy: Secondary | ICD-10-CM | POA: Insufficient documentation

## 2011-02-08 DIAGNOSIS — E039 Hypothyroidism, unspecified: Secondary | ICD-10-CM | POA: Insufficient documentation

## 2011-02-08 DIAGNOSIS — F3289 Other specified depressive episodes: Secondary | ICD-10-CM | POA: Insufficient documentation

## 2011-02-08 DIAGNOSIS — F329 Major depressive disorder, single episode, unspecified: Secondary | ICD-10-CM | POA: Insufficient documentation

## 2011-02-08 NOTE — ED Notes (Signed)
[id number] Expected date:02/08/11<BR> Expected time:10:53 PM<BR> Means of arrival:Ambulance<BR> Comments:<BR> EMS 50 GC, blood tinged cough , fever

## 2011-02-08 NOTE — ED Notes (Signed)
Per the police, the patient called 911 yesterday because he said his neighbor was "spoofing" him.   Has ivc papers for danger to self and others.

## 2011-02-09 LAB — COMPREHENSIVE METABOLIC PANEL
AST: 16 U/L (ref 0–37)
BUN: 30 mg/dL — ABNORMAL HIGH (ref 6–23)
CO2: 24 mEq/L (ref 19–32)
Calcium: 10 mg/dL (ref 8.4–10.5)
Chloride: 102 mEq/L (ref 96–112)
Creatinine, Ser: 1.8 mg/dL — ABNORMAL HIGH (ref 0.50–1.35)
GFR calc Af Amer: 46 mL/min — ABNORMAL LOW (ref >90)
GFR calc non Af Amer: 40 mL/min — ABNORMAL LOW (ref >90)
Glucose, Bld: 106 mg/dL — ABNORMAL HIGH (ref 70–99)
Total Bilirubin: 0.3 mg/dL (ref 0.3–1.2)

## 2011-02-09 LAB — DIFFERENTIAL
Basophils Absolute: 0 10*3/uL (ref 0.0–0.1)
Eosinophils Relative: 1 % (ref 0–5)
Lymphocytes Relative: 22 % (ref 12–46)
Lymphs Abs: 2.7 10*3/uL (ref 0.7–4.0)
Monocytes Absolute: 1.1 10*3/uL — ABNORMAL HIGH (ref 0.1–1.0)
Monocytes Relative: 9 % (ref 3–12)
Neutro Abs: 8.1 10*3/uL — ABNORMAL HIGH (ref 1.7–7.7)

## 2011-02-09 LAB — RAPID URINE DRUG SCREEN, HOSP PERFORMED
Cocaine: NOT DETECTED
Opiates: NOT DETECTED
Tetrahydrocannabinol: NOT DETECTED

## 2011-02-09 LAB — CBC
HCT: 36.3 % — ABNORMAL LOW (ref 39.0–52.0)
Hemoglobin: 12.4 g/dL — ABNORMAL LOW (ref 13.0–17.0)
MCV: 89.6 fL (ref 78.0–100.0)
RBC: 4.05 MIL/uL — ABNORMAL LOW (ref 4.22–5.81)
RDW: 13.1 % (ref 11.5–15.5)
WBC: 12.1 10*3/uL — ABNORMAL HIGH (ref 4.0–10.5)

## 2011-02-09 MED ORDER — ONDANSETRON HCL 4 MG PO TABS
4.0000 mg | ORAL_TABLET | Freq: Three times a day (TID) | ORAL | Status: DC | PRN
  Filled 2011-02-09: qty 1

## 2011-02-09 MED ORDER — ACETAMINOPHEN 325 MG PO TABS: 650.0000 mg | ORAL_TABLET | ORAL | Status: DC | PRN

## 2011-02-09 MED ORDER — NICOTINE 21 MG/24HR TD PT24: 21.0000 mg | MEDICATED_PATCH | Freq: Every day | TRANSDERMAL | Status: DC

## 2011-02-09 MED ORDER — ZIPRASIDONE MESYLATE 20 MG IM SOLR: 20.0000 mg | INTRAMUSCULAR | Status: DC | PRN

## 2011-02-09 MED ORDER — HALOPERIDOL LACTATE 5 MG/ML IJ SOLN
5.0000 mg | Freq: Once | INTRAMUSCULAR | Status: AC
  Administered 2011-02-09: 5 mg via INTRAMUSCULAR

## 2011-02-09 MED ORDER — HALOPERIDOL LACTATE 5 MG/ML IJ SOLN
INTRAMUSCULAR | Status: AC
  Administered 2011-02-09: 5 mg
  Filled 2011-02-09: qty 1

## 2011-02-09 MED ORDER — ALUM & MAG HYDROXIDE-SIMETH 200-200-20 MG/5ML PO SUSP: 30.0000 mL | ORAL | Status: DC | PRN

## 2011-02-09 MED ORDER — ZIPRASIDONE HCL 20 MG PO CAPS
40.0000 mg | ORAL_CAPSULE | Freq: Two times a day (BID) | ORAL | Status: DC
  Administered 2011-02-09 – 2011-02-10 (×3): 40 mg via ORAL
  Filled 2011-02-09 (×3): qty 2

## 2011-02-09 MED ORDER — LORAZEPAM 1 MG PO TABS
2.0000 mg | ORAL_TABLET | ORAL | Status: DC | PRN
  Administered 2011-02-10 (×2): 2 mg via ORAL
  Filled 2011-02-09 (×2): qty 2

## 2011-02-09 MED ORDER — LORAZEPAM 1 MG PO TABS: 1.0000 mg | ORAL_TABLET | Freq: Three times a day (TID) | ORAL | Status: DC | PRN

## 2011-02-09 NOTE — ED Provider Notes (Signed)
Patient resting comfortably with no complaints.  Dot Lanes, MD 02/09/11 2226
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
## 2011-02-09 NOTE — ED Notes (Signed)
Pt very upset and getting loud with staff. Security and charge nurse called. Pt attempted to walk out of TCU. [employer] at bedside with security. Called psych ED and report given.

## 2011-02-09 NOTE — ED Notes (Signed)
ACT in with pt will draw labs when assessment complete

## 2011-02-09 NOTE — ED Notes (Signed)
Medicated with 5 mg Haldol IM in right arm due to increased agitation. Pt allowed it and tolerated well.

## 2011-02-09 NOTE — ED Notes (Signed)
Worried about father, called home and the line is busy several times, asked [employer] officer to see if he could help out and officer called [employer] office to have them check on father, awaiting reply from [facility]

## 2011-02-09 NOTE — ED Provider Notes (Signed)
History     CSN: [id number]  Arrival date & time 02/08/11  2247   First MD Initiated Contact with Patient 02/09/11 0021      Chief Complaint  Patient presents with  . ivc, danger to self and others     (Consider location/radiation/quality/duration/timing/severity/associated sxs/prior treatment) HPI Comments: Patient with history of bipolar disorder per chart -- presents under police custody with IVC paperwork completed. Please say that the patient was intoxicated last night when they were at his house. Patient has continued to cause a disturbance. Patient states that he is on Klonopin currently and an antidepressant for his psychological issues. He denies any current medical complaints. Level V caviat applies due to psychiatric illness.  The history is provided by the police.    Past Medical History  Diagnosis Date  . Hyperlipidemia   . Hypothyroidism   . Bipolar disorder, unspecified   . Acute renal failure   . Diabetes mellitus     Past Surgical History  Procedure Date  . Other surgical history     lymph node removed from right arm    History reviewed. No pertinent family history.  History  Substance Use Topics  . Smoking status: Former Smoker -- 4.0 packs/day for 30 years    Types: Cigarettes    Quit date: 06/11/2008  . Smokeless tobacco: Not on file  . Alcohol Use: Yes     last night. daily      Review of Systems  Unable to perform ROS: Psychiatric disorder    Allergies  Review of patient's allergies indicates no known allergies.  Home Medications   Current Outpatient Rx  Name Route Sig Dispense Refill  . ALBUTEROL SULFATE HFA 108 (90 BASE) MCG/ACT IN AERS Inhalation Inhale 2 puffs into the lungs at bedtime.      Marland Kitchen CITALOPRAM HYDROBROMIDE 40 MG PO TABS  1/2 tab by mouth once daily     . CLONAZEPAM 0.5 MG PO TABS Oral Take by mouth 2 (two) times daily.      Marland Kitchen LEVOTHYROXINE SODIUM 150 MCG PO TABS Oral Take 150 mcg by mouth daily.        BP 155/99   Pulse 93  Temp 98.1 F (36.7 C)  Resp 20  SpO2 100%  Physical Exam  Nursing note and vitals reviewed. Constitutional: He is oriented to person, place, and time. He appears well-developed and well-nourished.  HENT:  Head: Normocephalic and atraumatic.  Eyes: Conjunctivae are normal. Pupils are equal, round, and reactive to light. Right eye exhibits no discharge. Left eye exhibits no discharge.  Neck: Normal range of motion. Neck supple.  Cardiovascular: Normal rate, regular rhythm and normal heart sounds.   Pulmonary/Chest: Effort normal and breath sounds normal.  Abdominal: Soft. Bowel sounds are normal. There is no tenderness. There is no rebound and no guarding.  Musculoskeletal: He exhibits no edema.  Neurological: He is alert and oriented to person, place, and time.  Skin: Skin is warm and dry.  Psychiatric: His speech is rapid and/or pressured.    ED Course  Procedures (including critical care time)  Labs Reviewed  CBC - Abnormal; Notable for the following:    WBC 12.1 (*)    RBC 4.05 (*)    Hemoglobin 12.4 (*)    HCT 36.3 (*)    All other components within normal limits  DIFFERENTIAL - Abnormal; Notable for the following:    Neutro Abs 8.1 (*)    Monocytes Absolute 1.1 (*)    All  other components within normal limits  COMPREHENSIVE METABOLIC PANEL  ETHANOL  URINE RAPID DRUG SCREEN (HOSP PERFORMED)   No results found.   1. Depression     1:57 AM patient seen and examined. Workup pending. Patient is under involuntary commitment.  2:51 AM ACT has seen.   MDM  IVC, pending ACT eval.         Faustino Congress, PA 02/09/11 [phone]

## 2011-02-09 NOTE — ED Notes (Signed)
PA at bedside to talk with pt.

## 2011-02-09 NOTE — ED Provider Notes (Signed)
Medical screening examination/treatment/procedure(s) were performed by non-physician practitioner and as supervising physician I was immediately available for consultation/collaboration.   Sharyon Cable, MD 02/09/11 [phone]

## 2011-02-09 NOTE — ED Provider Notes (Signed)
Pt seen in psych ED this morning.  Pt is under IVC.  Has had telepsych recommendations of d/c celexa.  Geodon 20mg  IV q4 hours prn not to exceed 40mg  in 24 hours, geodon 40mg  po BID, ativan 2mg  po q4-6 hours prn agititation.  Pt is standing in his room, pacing, but otherwise cooperative.    Threasa Beards, MD 02/09/11 [phone]

## 2011-02-09 NOTE — ED Notes (Signed)
Pt brought over from ED and placed in room 32 and made comfortable.

## 2011-02-10 ENCOUNTER — Other Ambulatory Visit: Payer: Self-pay

## 2011-02-10 DIAGNOSIS — F329 Major depressive disorder, single episode, unspecified: Secondary | ICD-10-CM

## 2011-02-10 DIAGNOSIS — F3289 Other specified depressive episodes: Secondary | ICD-10-CM

## 2011-02-10 MED ORDER — LEVOTHYROXINE SODIUM 150 MCG PO TABS
150.0000 ug | ORAL_TABLET | Freq: Once | ORAL | Status: AC
  Administered 2011-02-10: 150 ug via ORAL
  Filled 2011-02-10: qty 1

## 2011-02-10 MED ORDER — AMLODIPINE BESYLATE 5 MG PO TABS
5.0000 mg | ORAL_TABLET | Freq: Every day | ORAL | Status: DC
  Administered 2011-02-10: 5 mg via ORAL
  Filled 2011-02-10: qty 1

## 2011-02-10 NOTE — ED Provider Notes (Addendum)
Pt is IVC, psychosis.  Admitted to [facility], but no beds currently available.  Vitals stable, resting currently.    Saddie Benders. Jandy Brackens, MD 02/10/11 0825      11:08 AM Pt is accepted to [facility] by Dr. Wilber Oliphant, however no ride available until later today.     Saddie Benders. Johnasia Liese, MD 02/10/11 1108

## 2011-02-10 NOTE — ED Notes (Signed)
Pt has increasing anxiety this morning, asking to speak to police officer to contest his IVC, but alternately states that this is a wonderful facility and that he is happy here. [employer] on duty speaks to pt in room, pt is satisfied.
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
## 2011-02-10 NOTE — ED Notes (Signed)
First Examination was not completed. Completed and had EDP sign for pt to be transported via [facility] to [facility].

## 2011-02-10 NOTE — Discharge Planning (Signed)
Patient has been accepted to [facility] by Dr. Wilber Oliphant. Report to be called to [facility] [phone]. Patient to be transported by [facility] to [facility], [place]. Patient's nurse has been notified of disposition.  Laurena Spies , MSW, LCSWA 02/10/2011 10:53 AM  [phone]

## 2011-02-10 NOTE — ED Notes (Signed)
TC to [facility] and informed no beds per [facility].  TC to [facility] and spoke with Shirlean Mylar, who stated they had no beds today.  TC to [facility] & informed no beds currently, but possibly after 1:00pm after discharges.  TC to [facility] spoke with Sharyn Lull who stated they do have beds. Info faxed to [facility] for review. TC to [facility] spoke with Juliann Pulse who stated they have beds today.

## 2012-02-13 IMAGING — CR DG CHEST 1V PORT
1 series · 1 of 1 positions shown · non-contrast
Comparison: 09/16/2007.

CLINICAL DATA: 59-year-old male cough, sore throat, shortness of
breath.

PORTABLE CHEST - 1 VIEW

[view not recorded]
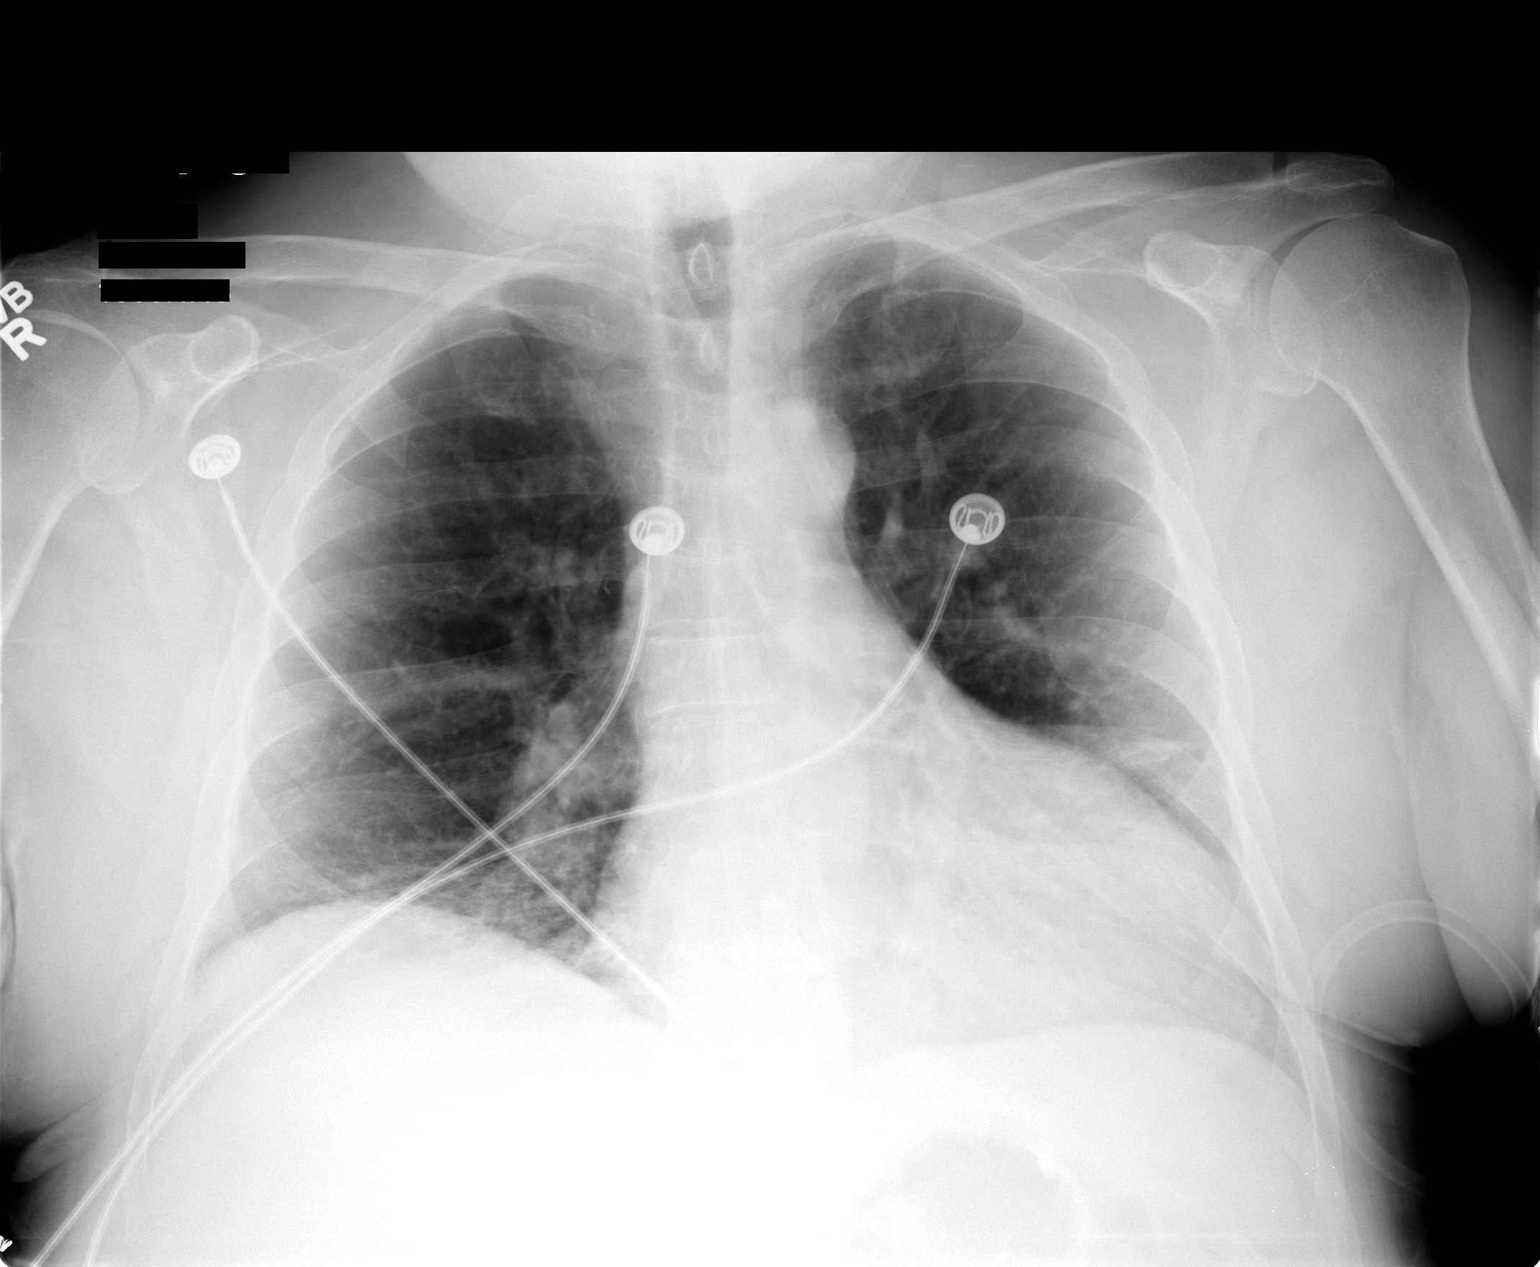

[1 of 1 positions shown; findings below may reference images not displayed]

FINDINGS: Portable semi upright AP view 1900 hours.  Mild
cardiomegaly suggested and appears probably increased since 2770.
Other mediastinal contours are within normal limits.   No
pneumothorax, pulmonary edema, pleural effusion or consolidation.
Linear streaky opacity at the left midlung and right lung base.
Visualized tracheal air column is within normal limits.
IMPRESSION: 1.  Mild cardiomegaly, probably increase in 2770.
2.  Atelectasis.

## 2012-02-13 IMAGING — CT CT HEAD W/O CM
1 series · 16 of 30 positions shown, 20 images · non-contrast
Comparison: CT head without contrast 03/17/2005

CLINICAL DATA: Cough.  Sore throat.  Medical clearance.  Possible
drug overdose.

CT HEAD WITHOUT CONTRAST
TECHNIQUE: Contiguous axial images were obtained from the base of
the skull through the vertex without contrast.

[Series 2: headseq 4.8 h45s · axial · 0.43mm/px · z∈[-84,+73]mm · 16 of 36 slices shown, 20 images]
[im 2/36  brain]
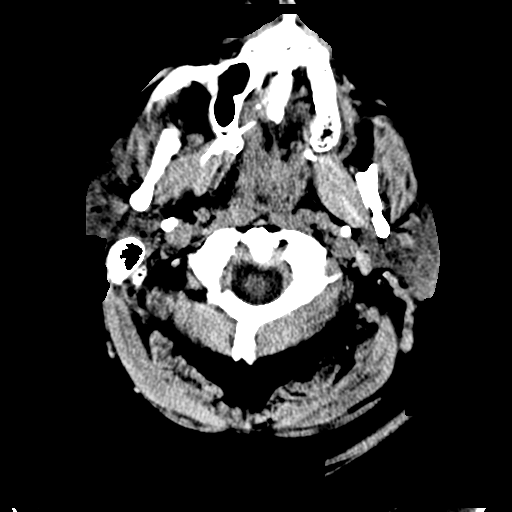
[im 2/36  bone]
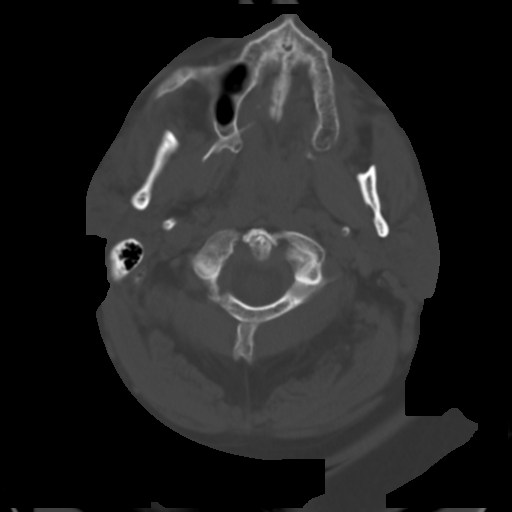
[im 4/36  brain]
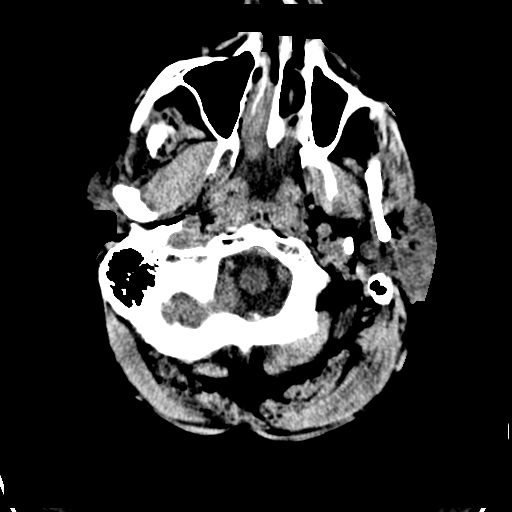
[im 7/36  brain]
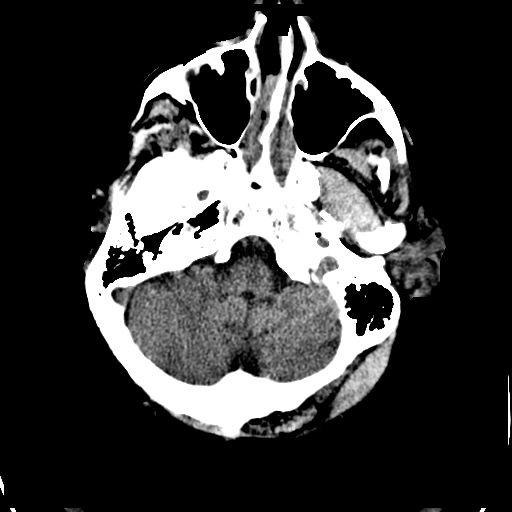
[im 9/36  brain]
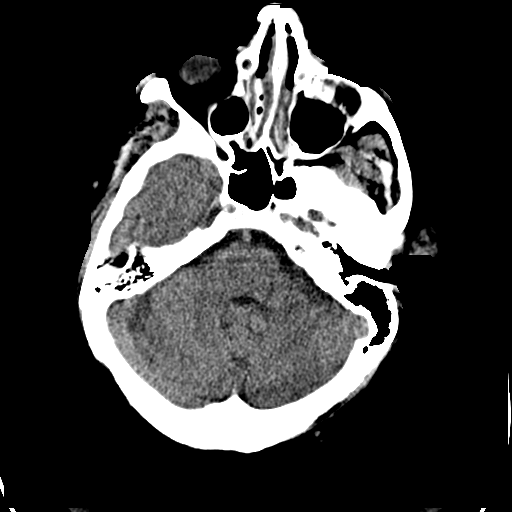
[im 10/36  brain]
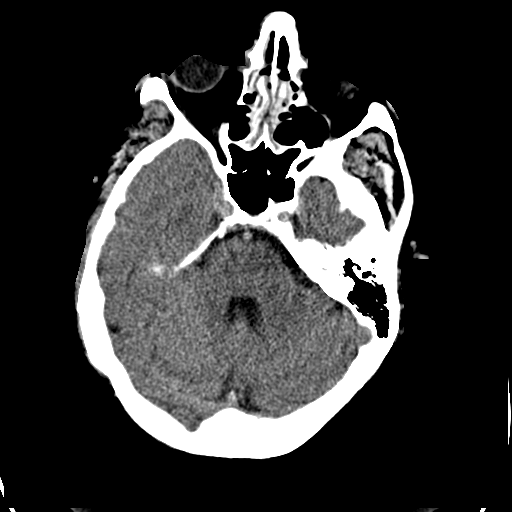
[im 10/36  bone]
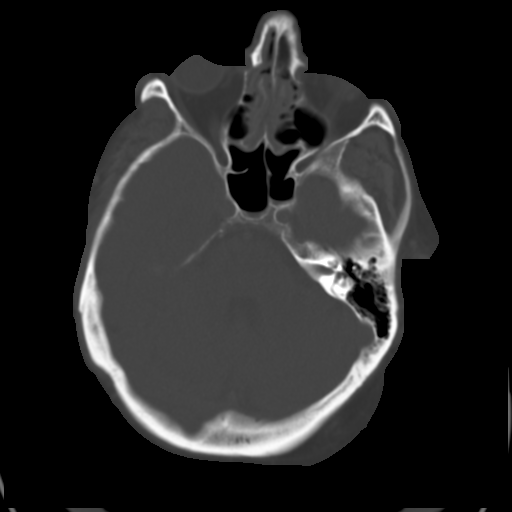
[im 13/36  brain]
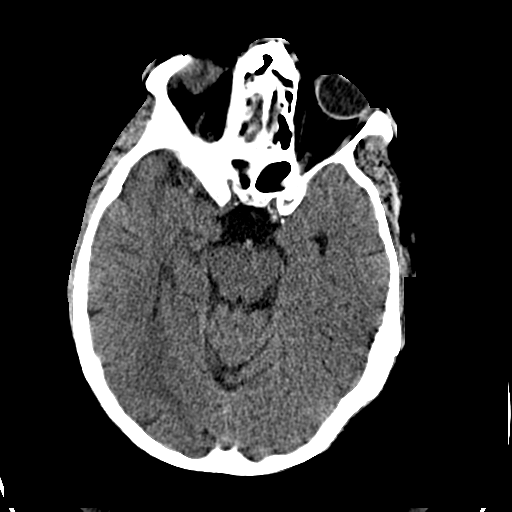
[im 15/36  brain]
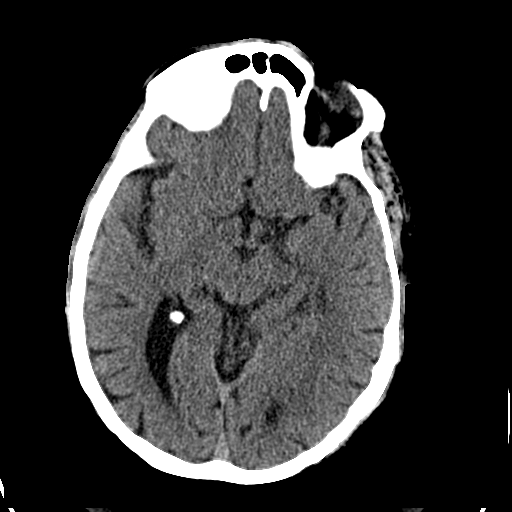
[im 17/36  brain]
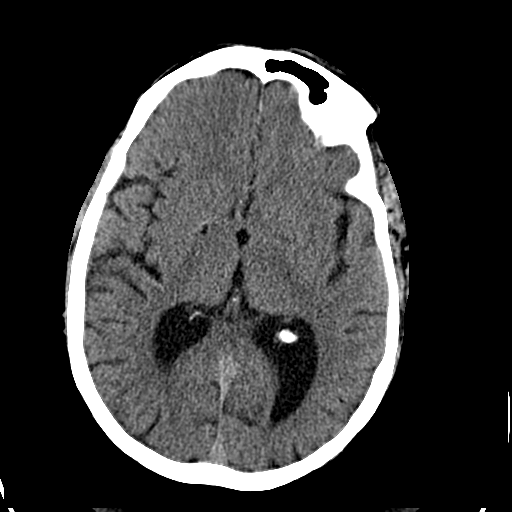
[im 19/36  brain]
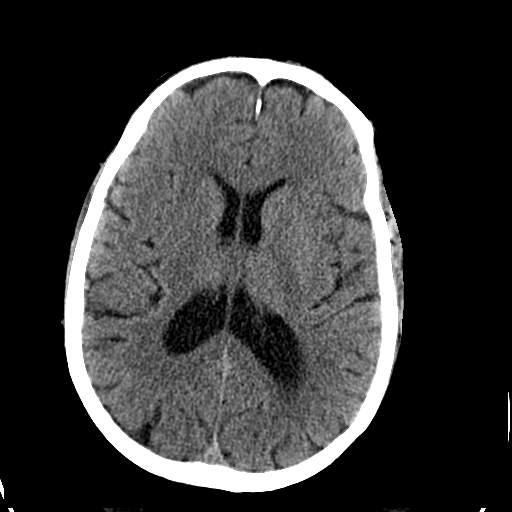
[im 19/36  bone]
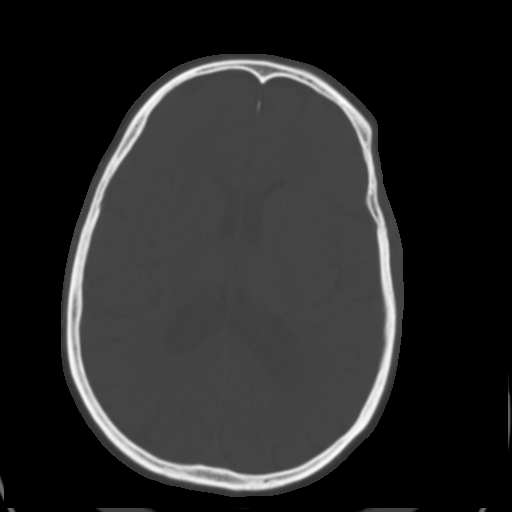
[im 21/36  brain]
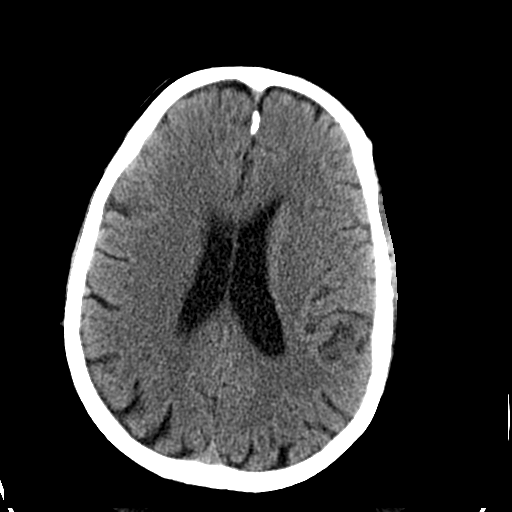
[im 23/36  brain]
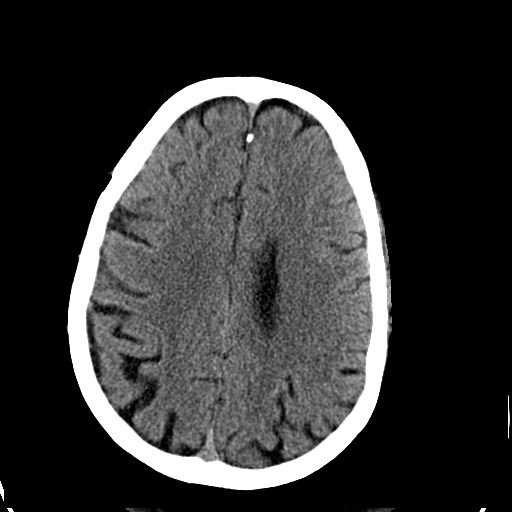
[im 26/36  brain]
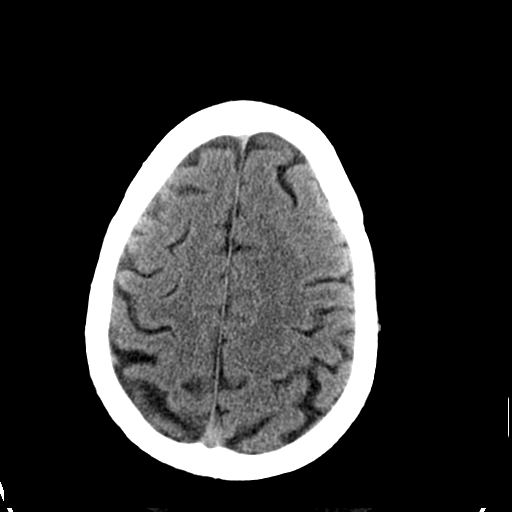
[im 27/36  brain]
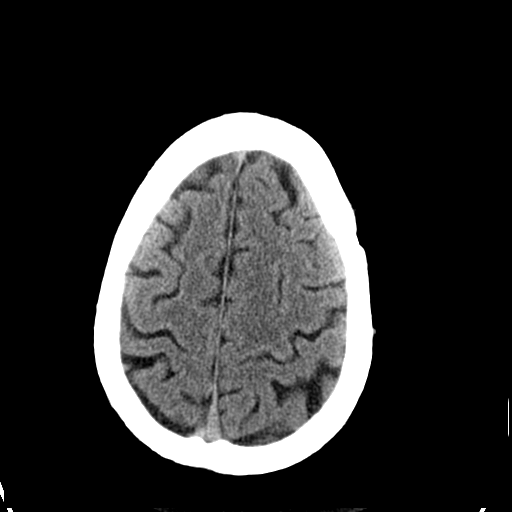
[im 27/36  bone]
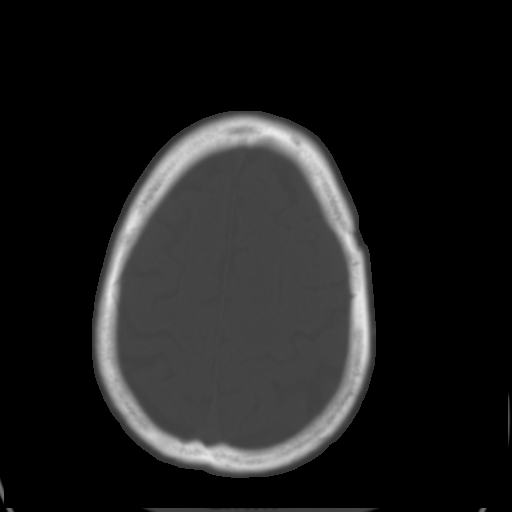
[im 29/36  brain]
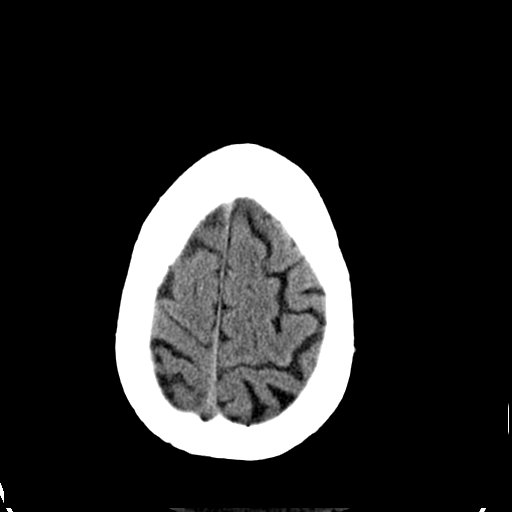
[im 32/36  brain]
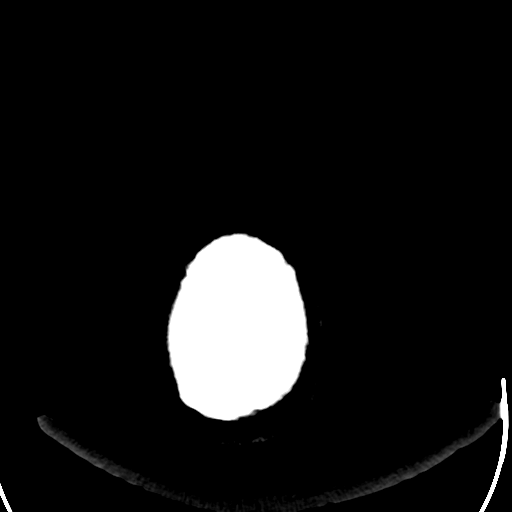
[im 34/36  brain]
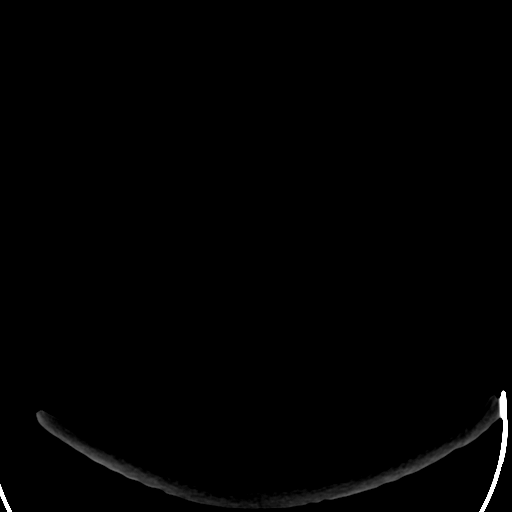

[16 of 30 positions shown; findings below may reference images not displayed]

FINDINGS: The patient is intubated.  No acute cortical infarct,
hemorrhage, mass lesion, or hydrocephalus is present.  Mild mucosal
thickening is noted in the left maxillary sinus.  Mucosal
thickening is present throughout the ethmoid air cells and to a
minimal extent the sphenoid sinuses and frontal sinuses
bilaterally.  The mastoid air cells are clear.  The osseous skull
is intact.
IMPRESSION: 1.  No acute intracranial abnormality.
2.  Minimal sinus disease.  This may be related to the patient's
intubated status.

## 2012-02-14 IMAGING — US US RENAL PORT
1 series · 14 of 25 positions shown · non-contrast
Comparison: None.

CLINICAL DATA: Elevated creatinine, abdominal pain, renal failure

RENAL/URINARY TRACT ULTRASOUND COMPLETE PORTABLE

[Series 1: us renal port · 0.31mm/px · 14 of 49 slices shown]
[im 1/49]
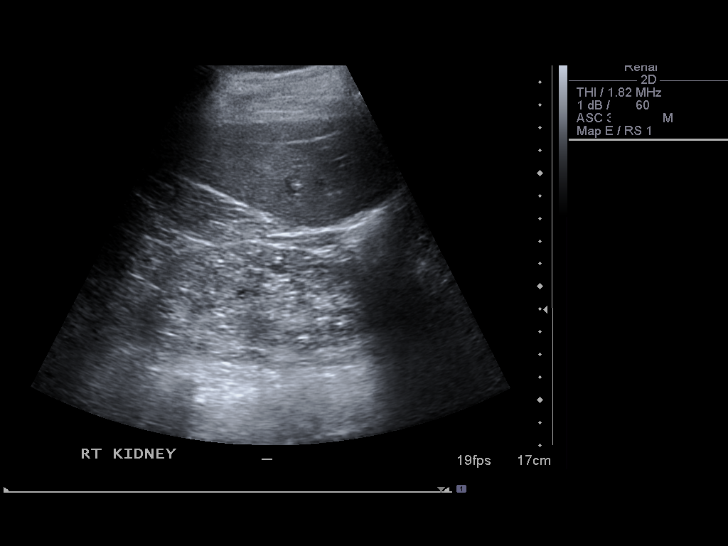
[im 5/49]
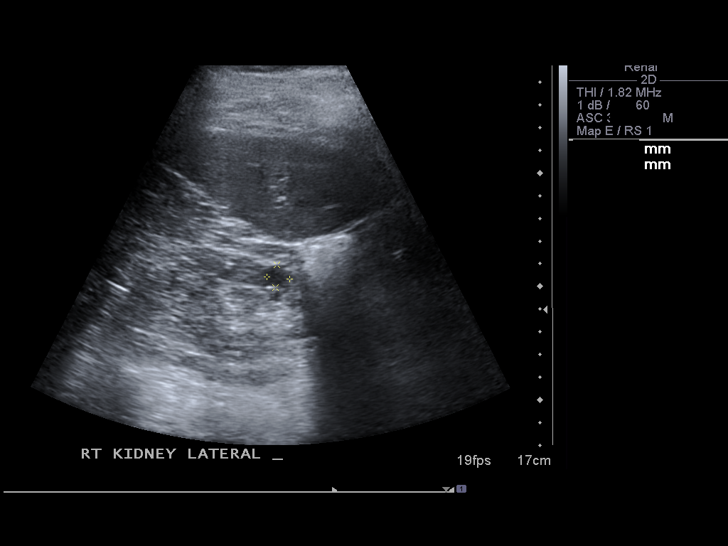
[im 9/49]
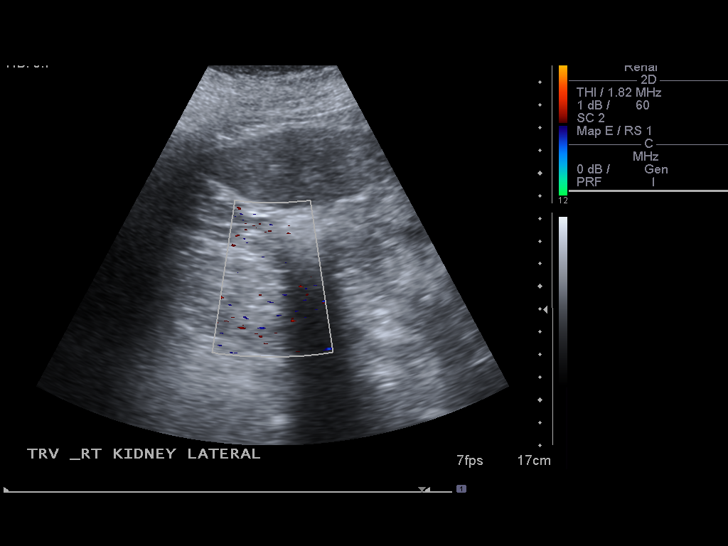
[im 13/49]
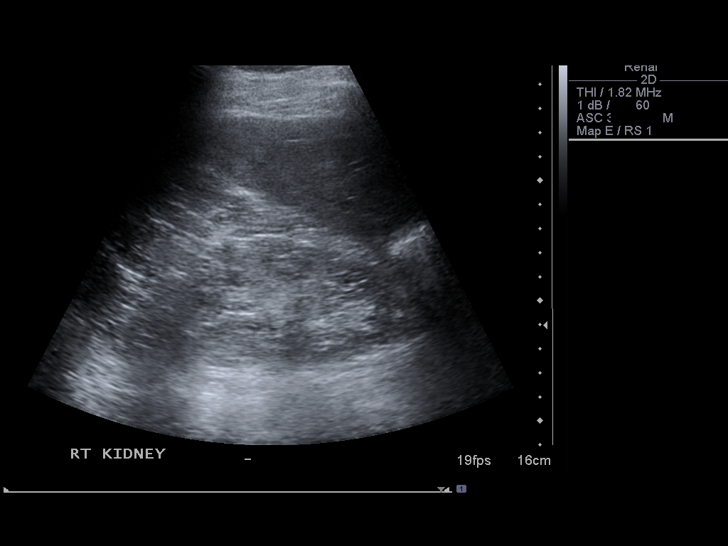
[im 17/49]
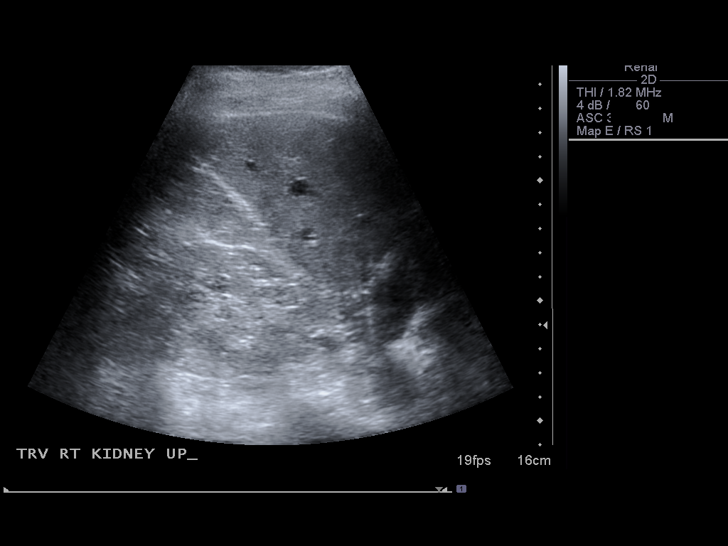
[im 19/49]
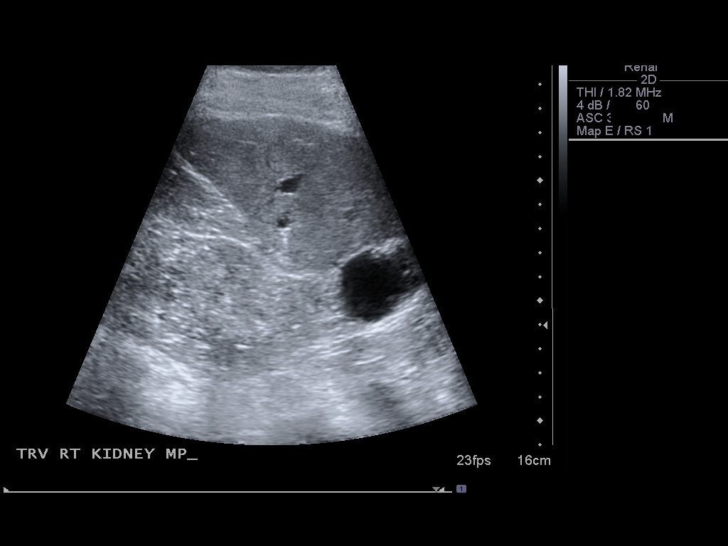
[im 23/49]
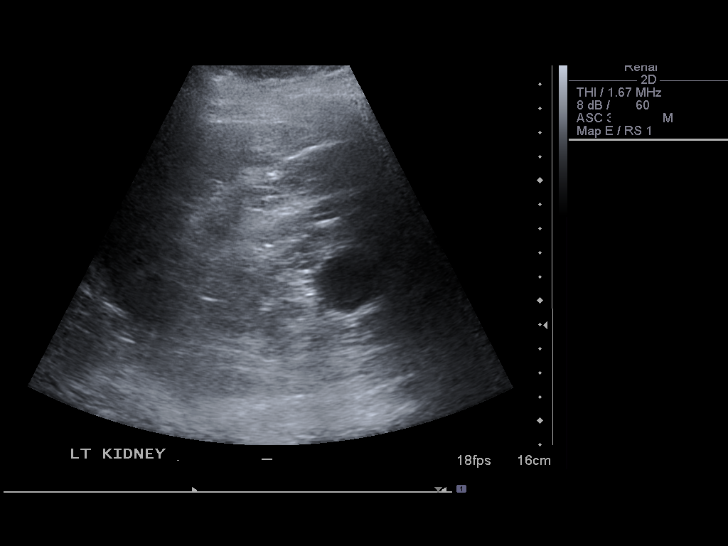
[im 27/49]
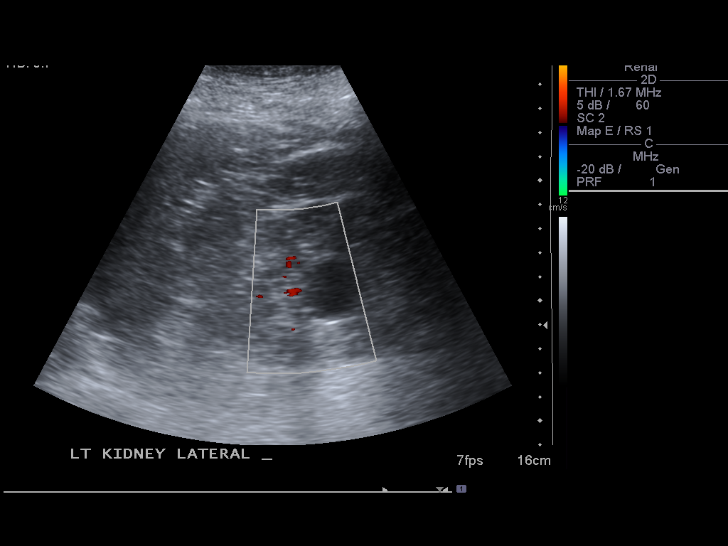
[im 31/49]
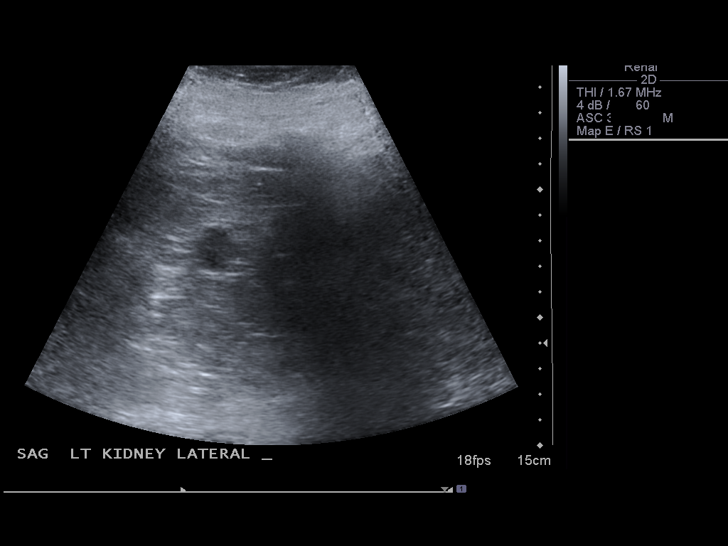
[im 33/49]
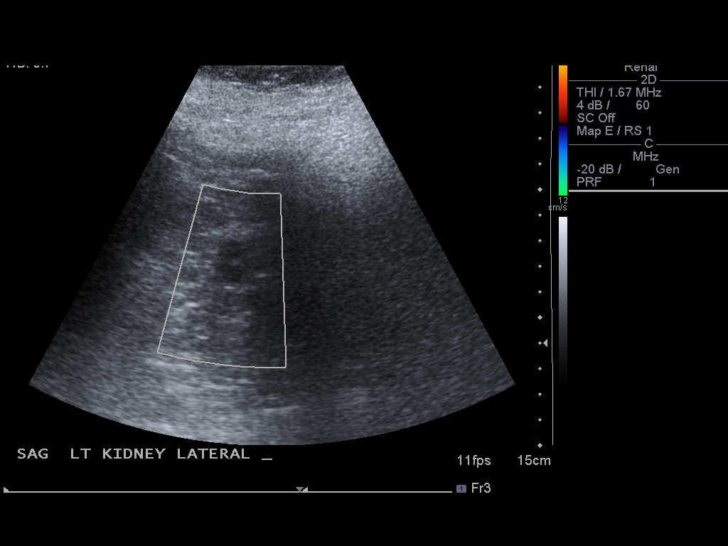
[im 37/49]
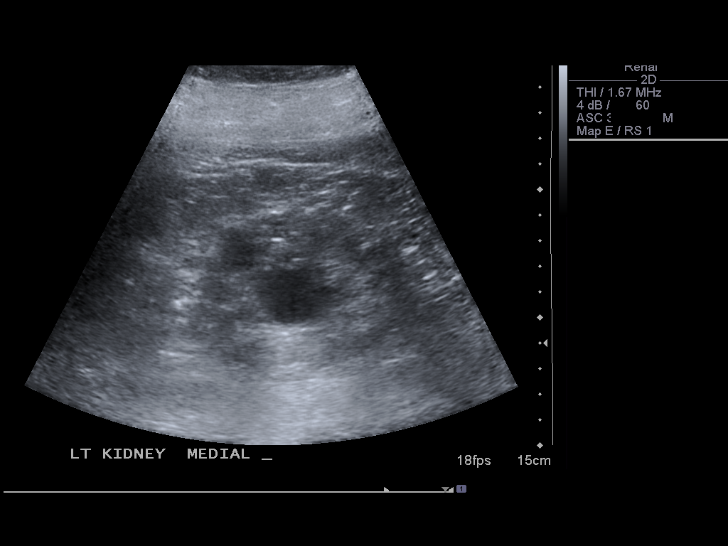
[im 41/49]
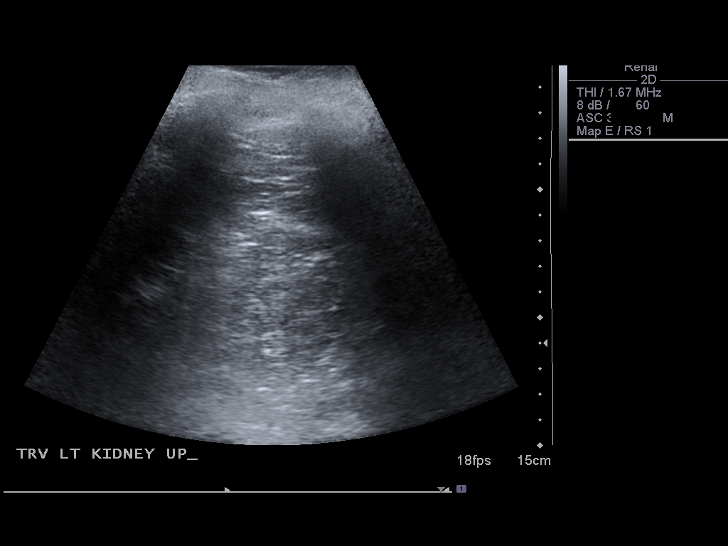
[im 45/49]
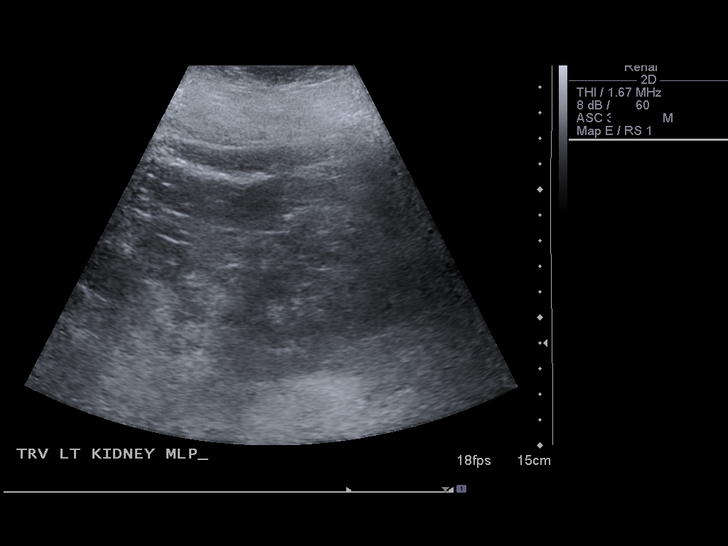
[im 49/49]
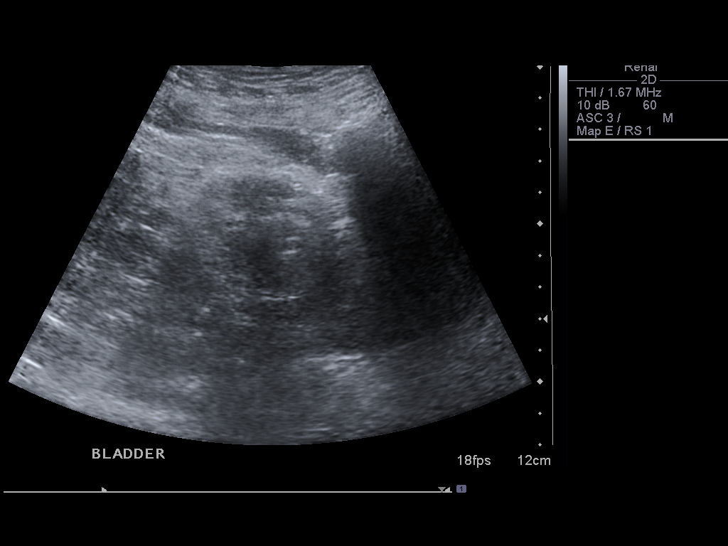

[14 of 25 positions shown; findings below may reference images not displayed]

FINDINGS: Right Kidney:  9.7 cm.  Diffusely echogenic renal parenchyma.  No
hydronephrosis.  5 x 10 mm simple appearing cyst in the lower pole.

Left Kidney:  10.2 cm.  Diffusely echogenic renal parenchyma.  No
hydronephrosis.  25 x 27 mm simple appearing cyst from the lower
pole.  There is adjacent 11 x 18 x 20 mm hypoechoic lesion which
may represent a small cyst but is incompletely characterized.

Bladder:  Decompressed by Foley catheter.
IMPRESSION: 1.  Bilateral echogenic renal parenchyma, a nonspecific indicator
of medical renal disease.
2.  Negative for hydronephrosis.
3.  Bilateral renal lesions, larger probably cysts, smaller
incompletely characterized.

## 2012-10-17 ENCOUNTER — Ambulatory Visit: Payer: MEDICARE | Admitting: *Deleted

## 2013-02-22 ENCOUNTER — Other Ambulatory Visit: Payer: Self-pay | Admitting: Nephrology

## 2013-02-22 ENCOUNTER — Ambulatory Visit
Admission: RE | Admit: 2013-02-22 | Discharge: 2013-02-22 | Disposition: A | Discharge status: Home / Self Care | Payer: MEDICARE | Source: Ambulatory Visit | Attending: Nephrology | Admitting: Nephrology

## 2013-02-22 DIAGNOSIS — R0989 Other specified symptoms and signs involving the circulatory and respiratory systems: Secondary | ICD-10-CM

## 2013-02-22 DIAGNOSIS — R05 Cough: Secondary | ICD-10-CM

## 2013-02-22 DIAGNOSIS — R059 Cough, unspecified: Secondary | ICD-10-CM

## 2020-12-28 ENCOUNTER — Emergency Department (HOSPITAL_COMMUNITY): Payer: Medicare Other

## 2020-12-28 ENCOUNTER — Encounter (HOSPITAL_COMMUNITY): Payer: Self-pay

## 2020-12-28 ENCOUNTER — Inpatient Hospital Stay (HOSPITAL_COMMUNITY)
Admission: EM | Admit: 2020-12-28 | Discharge: 2020-12-30 | DRG: 176 | Disposition: A | Discharge status: Home / Self Care | Payer: Medicare Other | Attending: Internal Medicine | Admitting: Internal Medicine

## 2020-12-28 ENCOUNTER — Other Ambulatory Visit: Payer: Self-pay

## 2020-12-28 DIAGNOSIS — E1122 Type 2 diabetes mellitus with diabetic chronic kidney disease: Secondary | ICD-10-CM | POA: Diagnosis present

## 2020-12-28 DIAGNOSIS — R9431 Abnormal electrocardiogram [ECG] [EKG]: Secondary | ICD-10-CM | POA: Diagnosis not present

## 2020-12-28 DIAGNOSIS — R42 Dizziness and giddiness: Secondary | ICD-10-CM | POA: Diagnosis not present

## 2020-12-28 DIAGNOSIS — E1129 Type 2 diabetes mellitus with other diabetic kidney complication: Secondary | ICD-10-CM

## 2020-12-28 DIAGNOSIS — N184 Chronic kidney disease, stage 4 (severe): Secondary | ICD-10-CM | POA: Diagnosis not present

## 2020-12-28 DIAGNOSIS — N179 Acute kidney failure, unspecified: Secondary | ICD-10-CM | POA: Diagnosis present

## 2020-12-28 DIAGNOSIS — Z86711 Personal history of pulmonary embolism: Secondary | ICD-10-CM | POA: Diagnosis present

## 2020-12-28 DIAGNOSIS — R55 Syncope and collapse: Secondary | ICD-10-CM

## 2020-12-28 DIAGNOSIS — I959 Hypotension, unspecified: Secondary | ICD-10-CM | POA: Diagnosis not present

## 2020-12-28 DIAGNOSIS — Z87891 Personal history of nicotine dependence: Secondary | ICD-10-CM | POA: Diagnosis not present

## 2020-12-28 DIAGNOSIS — F319 Bipolar disorder, unspecified: Secondary | ICD-10-CM | POA: Diagnosis present

## 2020-12-28 DIAGNOSIS — E039 Hypothyroidism, unspecified: Secondary | ICD-10-CM | POA: Diagnosis not present

## 2020-12-28 DIAGNOSIS — R000 Tachycardia, unspecified: Secondary | ICD-10-CM | POA: Diagnosis not present

## 2020-12-28 DIAGNOSIS — R7989 Other specified abnormal findings of blood chemistry: Secondary | ICD-10-CM | POA: Diagnosis not present

## 2020-12-28 DIAGNOSIS — I2699 Other pulmonary embolism without acute cor pulmonale: Secondary | ICD-10-CM | POA: Diagnosis not present

## 2020-12-28 DIAGNOSIS — I129 Hypertensive chronic kidney disease with stage 1 through stage 4 chronic kidney disease, or unspecified chronic kidney disease: Secondary | ICD-10-CM | POA: Diagnosis not present

## 2020-12-28 DIAGNOSIS — R531 Weakness: Secondary | ICD-10-CM | POA: Diagnosis not present

## 2020-12-28 DIAGNOSIS — Z794 Long term (current) use of insulin: Secondary | ICD-10-CM | POA: Diagnosis not present

## 2020-12-28 DIAGNOSIS — Z79890 Hormone replacement therapy (postmenopausal): Secondary | ICD-10-CM

## 2020-12-28 DIAGNOSIS — Z79899 Other long term (current) drug therapy: Secondary | ICD-10-CM | POA: Diagnosis not present

## 2020-12-28 DIAGNOSIS — Z20822 Contact with and (suspected) exposure to covid-19: Secondary | ICD-10-CM | POA: Diagnosis not present

## 2020-12-28 DIAGNOSIS — I2609 Other pulmonary embolism with acute cor pulmonale: Secondary | ICD-10-CM | POA: Diagnosis not present

## 2020-12-28 LAB — CBC WITH DIFFERENTIAL/PLATELET
Abs Immature Granulocytes: 0.01 10*3/uL (ref 0.00–0.07)
Basophils Absolute: 0 10*3/uL (ref 0.0–0.1)
Basophils Relative: 0 %
Eosinophils Absolute: 0.2 10*3/uL (ref 0.0–0.5)
Eosinophils Relative: 3 %
HCT: 35.4 % — ABNORMAL LOW (ref 39.0–52.0)
Hemoglobin: 10.7 g/dL — ABNORMAL LOW (ref 13.0–17.0)
Immature Granulocytes: 0 %
Lymphocytes Relative: 15 %
Lymphs Abs: 1 10*3/uL (ref 0.7–4.0)
MCH: 29.6 pg (ref 26.0–34.0)
MCHC: 30.2 g/dL (ref 30.0–36.0)
MCV: 97.8 fL (ref 80.0–100.0)
Monocytes Absolute: 0.5 10*3/uL (ref 0.1–1.0)
Monocytes Relative: 7 %
Neutro Abs: 5 10*3/uL (ref 1.7–7.7)
Neutrophils Relative %: 75 %
Platelets: 162 10*3/uL (ref 150–400)
RBC: 3.62 MIL/uL — ABNORMAL LOW (ref 4.22–5.81)
RDW: 13.4 % (ref 11.5–15.5)
WBC: 6.8 10*3/uL (ref 4.0–10.5)
nRBC: 0 % (ref 0.0–0.2)

## 2020-12-28 LAB — COMPREHENSIVE METABOLIC PANEL
ALT: 17 U/L (ref 0–44)
AST: 16 U/L (ref 15–41)
Albumin: 3.7 g/dL (ref 3.5–5.0)
Alkaline Phosphatase: 73 U/L (ref 38–126)
Anion gap: 7 (ref 5–15)
BUN: 37 mg/dL — ABNORMAL HIGH (ref 8–23)
CO2: 22 mmol/L (ref 22–32)
Calcium: 8.9 mg/dL (ref 8.9–10.3)
Chloride: 112 mmol/L — ABNORMAL HIGH (ref 98–111)
Creatinine, Ser: 4.77 mg/dL — ABNORMAL HIGH (ref 0.61–1.24)
GFR, Estimated: 13 mL/min — ABNORMAL LOW (ref >60)
Glucose, Bld: 291 mg/dL — ABNORMAL HIGH (ref 70–99)
Potassium: 4 mmol/L (ref 3.5–5.1)
Sodium: 141 mmol/L (ref 135–145)
Total Bilirubin: 0.6 mg/dL (ref 0.3–1.2)
Total Protein: 6.6 g/dL (ref 6.5–8.1)

## 2020-12-28 LAB — TROPONIN I (HIGH SENSITIVITY)
Troponin I (High Sensitivity): 13 ng/L (ref <18)
Troponin I (High Sensitivity): 12 ng/L (ref <18)

## 2020-12-28 LAB — RESP PANEL BY RT-PCR (FLU A&B, COVID) ARPGX2
Influenza A by PCR: NEGATIVE
Influenza B by PCR: NEGATIVE
SARS Coronavirus 2 by RT PCR: NEGATIVE

## 2020-12-28 LAB — D-DIMER, QUANTITATIVE: D-Dimer, Quant: 0.92 ug/mL-FEU — ABNORMAL HIGH (ref 0.00–0.50)

## 2020-12-28 MED ORDER — ACETAMINOPHEN 650 MG RE SUPP: 650.0000 mg | Freq: Four times a day (QID) | RECTAL | Status: DC | PRN

## 2020-12-28 MED ORDER — CLONAZEPAM 0.5 MG PO TABS
0.5000 mg | ORAL_TABLET | Freq: Two times a day (BID) | ORAL | Status: DC
  Administered 2020-12-28 – 2020-12-30 (×4): 0.5 mg via ORAL
  Filled 2020-12-28 (×4): qty 1

## 2020-12-28 MED ORDER — HEPARIN BOLUS VIA INFUSION
3000.0000 [IU] | Freq: Once | INTRAVENOUS | Status: AC
  Administered 2020-12-28: 3000 [IU] via INTRAVENOUS
  Filled 2020-12-28: qty 3000

## 2020-12-28 MED ORDER — CITALOPRAM HYDROBROMIDE 10 MG PO TABS
20.0000 mg | ORAL_TABLET | Freq: Every day | ORAL | Status: DC
  Administered 2020-12-28: 20 mg via ORAL
  Filled 2020-12-28: qty 2

## 2020-12-28 MED ORDER — ACETAMINOPHEN 325 MG PO TABS
650.0000 mg | ORAL_TABLET | Freq: Four times a day (QID) | ORAL | Status: DC | PRN
  Filled 2020-12-28: qty 2

## 2020-12-28 MED ORDER — ONDANSETRON HCL 4 MG/2ML IJ SOLN: 4.0000 mg | Freq: Four times a day (QID) | INTRAMUSCULAR | Status: DC | PRN

## 2020-12-28 MED ORDER — ONDANSETRON HCL 4 MG PO TABS: 4.0000 mg | ORAL_TABLET | Freq: Four times a day (QID) | ORAL | Status: DC | PRN

## 2020-12-28 MED ORDER — HEPARIN (PORCINE) 25000 UT/250ML-% IV SOLN
1000.0000 [IU]/h | INTRAVENOUS | Status: DC
  Administered 2020-12-28: 1000 [IU]/h via INTRAVENOUS
  Filled 2020-12-28: qty 250

## 2020-12-28 MED ORDER — TECHNETIUM TO 99M ALBUMIN AGGREGATED
3.6000 | Freq: Once | INTRAVENOUS | Status: AC | PRN
  Administered 2020-12-28: 3.6 via INTRAVENOUS

## 2020-12-28 MED ORDER — LACTATED RINGERS IV BOLUS
1000.0000 mL | Freq: Once | INTRAVENOUS | Status: AC
  Administered 2020-12-28: 1000 mL via INTRAVENOUS
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
## 2020-12-28 NOTE — ED Notes (Signed)
Pt given sandwich and snacks per request

## 2020-12-28 NOTE — ED Provider Notes (Signed)
[facility] DEPT Provider Note   CSN: [id number] Arrival date & time: 12/28/20  9518     History Chief Complaint  Patient presents with   Near Syncope   Hypotension    Charlotte Brafford is a 70 y.o. male.  HPI 70 year old male presents with near syncope.  Due to an issue with his truck, he has been having to walk to and from the grocery store.  He has made this routine.  However this time when walking he noticed he was more short of breath than typical and was very winded.  When he got to the store he collapsed to the ground.  He states he never actually passed out.  He was given orange juice in case this was a glucose problem but states it did not help.  EMS found him to be hypotensive and they gave him about 400 cc of IV fluids.  He states he is feeling a lot better.  He never had chest discomfort.  He has not been sick prior to this including no fevers, vomiting, diarrhea, change in p.o. intake.  He denies any focal weakness or headaches. No recent travel/leg swelling.  Past Medical History:  Diagnosis Date   Acute renal failure ([name])    Bipolar disorder, unspecified ([place])    Diabetes mellitus    Hyperlipidemia    Hypothyroidism     Patient Active Problem List   Diagnosis Date Noted   ACUTE RESPIRATORY FAILURE 04/21/2010   RENAL FAILURE, ACUTE 04/21/2010   DELIRIUM 04/21/2010   TOBACCO ABUSE-HISTORY OF 04/21/2010    Past Surgical History:  Procedure Laterality Date   OTHER SURGICAL HISTORY     lymph node removed from right arm       History reviewed. No pertinent family history.  Social History   Tobacco Use   Smoking status: Former    Packs/day: 4.00    Years: 30.00    Pack years: 120.00    Types: Cigarettes    Quit date: 06/11/2008    Years since quitting: 12.5  Substance Use Topics   Alcohol use: Not Currently    Comment: last night. daily   Drug use: Not Currently    Home Medications Prior to Admission medications    Medication Sig Start Date End Date Taking? Authorizing Provider  albuterol (PROVENTIL HFA) 108 (90 BASE) MCG/ACT inhaler Inhale 2 puffs into the lungs at bedtime.      [provider]  amLODipine (NORVASC) 5 MG tablet Take 5 mg by mouth daily.     [provider]  citalopram (CELEXA) 40 MG tablet 1/2 tab by mouth once daily     [provider]  clonazePAM (KLONOPIN) 0.5 MG tablet Take by mouth 2 (two) times daily.      [provider]  levothyroxine (SYNTHROID, LEVOTHROID) 150 MCG tablet Take 150 mcg by mouth daily.      [provider]    Allergies    Patient has no known allergies.  Review of Systems   Review of Systems  Constitutional:  Negative for fever.  Respiratory:  Positive for shortness of breath. Negative for cough.   Cardiovascular:  Negative for chest pain and leg swelling.  Gastrointestinal:  Negative for diarrhea and vomiting.  Neurological:  Positive for weakness and light-headedness.  All other systems reviewed and are negative.  Physical Exam Updated Vital Signs BP 116/75   Pulse 77   Temp 98 F (36.7 C)   Resp 16   Ht  5\' 4"  (1.626 m)   Wt 59 kg   SpO2 97%   BMI 22.31 kg/m   Physical Exam Vitals and nursing note reviewed.  Constitutional:      General: He is not in acute distress.    Appearance: He is well-developed. He is not ill-appearing or diaphoretic.  HENT:     Head: Normocephalic and atraumatic.     Right Ear: External ear normal.     Left Ear: External ear normal.     Nose: Nose normal.  Eyes:     General:        Right eye: No discharge.        Left eye: No discharge.     Extraocular Movements: Extraocular movements intact.     Pupils: Pupils are equal, round, and reactive to light.  Cardiovascular:     Rate and Rhythm: Regular rhythm. Tachycardia present.     Heart sounds: Normal heart sounds.  Pulmonary:     Effort: Pulmonary effort is normal.     Breath sounds: Normal breath sounds.   Abdominal:     Palpations: Abdomen is soft.     Tenderness: There is no abdominal tenderness.  Musculoskeletal:     Cervical back: Neck supple.     Right lower leg: No edema.     Left lower leg: No edema.  Skin:    General: Skin is warm and dry.  Neurological:     Mental Status: He is alert.     Comments: CN 3-12 grossly intact. 5/5 strength in all 4 extremities. Grossly normal sensation. Normal finger to nose.   Psychiatric:        Mood and Affect: Mood is not anxious.    ED Results / Procedures / Treatments   Labs (all labs ordered are listed, but only abnormal results are displayed) Labs Reviewed  COMPREHENSIVE METABOLIC PANEL - Abnormal; Notable for the following components:      Result Value   Chloride 112 (*)    Glucose, Bld 291 (*)    BUN 37 (*)    Creatinine, Ser 4.77 (*)    GFR, Estimated 13 (*)    All other components within normal limits  CBC WITH DIFFERENTIAL/PLATELET - Abnormal; Notable for the following components:   RBC 3.62 (*)    Hemoglobin 10.7 (*)    HCT 35.4 (*)    All other components within normal limits  D-DIMER, QUANTITATIVE - Abnormal; Notable for the following components:   D-Dimer, Quant 0.92 (*)    All other components within normal limits  TROPONIN I (HIGH SENSITIVITY)  TROPONIN I (HIGH SENSITIVITY)    EKG EKG Interpretation  Date/Time:  Sunday December 28 2020 10:09:30 EST Ventricular Rate:  105 PR Interval:  151 QRS Duration: 166 QT Interval:  376 QTC Calculation: 497 R Axis:   258 Text Interpretation: Sinus tachycardia Right bundle branch block Interpretation limited secondary to artifact Confirmed by Sherwood Gambler [phone]) on 12/28/2020 10:17:40 AM  Radiology DG Chest 2 View  Result Date: 12/28/2020 CLINICAL DATA:  70 year old male with weakness and dizziness. Near syncope. EXAM: CHEST - 2 VIEW COMPARISON:  Chest radiographs 02/22/2013 and earlier. FINDINGS: Lung volumes and mediastinal contours are stable. Mild chronic  pulmonary hyperinflation. Attenuation of upper lobe bronchovascular markings suspicious for emphysema. Visualized tracheal air column is within normal limits. No pneumothorax, pulmonary edema, pleural effusion or acute pulmonary opacity. Mild left lung base scarring. No acute osseous abnormality identified. Osteopenia. Negative visible bowel gas. IMPRESSION: 1. No acute  cardiopulmonary abnormality. 2. Suspected emphysema. Electronically Signed   By: Genevie Ann M.D.   On: 12/28/2020 10:50    Procedures Procedures   Medications Ordered in ED Medications  lactated ringers bolus 1,000 mL (1,000 mLs Intravenous New Bag/Given 12/28/20 1118)  technetium albumin aggregated (MAA) injection solution 3.6 millicurie (3.6 millicuries Intravenous Contrast Given 12/28/20 1444)    ED Course  I have reviewed the triage vital signs and the nursing notes.  Pertinent labs & imaging results that were available during my care of the patient were reviewed by me and considered in my medical decision making (see chart for details).    MDM Rules/Calculators/A&P                           Patient was given IV fluids.  He seemed to have his O2 sats dropped a little bit when he got orthostatic and his D-dimer is elevated.  He will need PE work-up.  His chronic kidney disease shows that his creatinine is actually improved from most recent [place] lab work in April which showed a creatinine of 6.  Otherwise, he will need this VQ scan and then reevaluation.  Care to Dr. Darl Householder. Final Clinical Impression(s) / ED Diagnoses Final diagnoses:  None    Rx / DC Orders ED Discharge Orders     None        Sherwood Gambler, MD 12/28/20 1511

## 2020-12-28 NOTE — Progress Notes (Signed)
TRH   The patient declined to be examined because I did not know how much his admission was going to cost.  I told him that he had a pulmonary embolism and ER doctor had recommended admission but did not know how much it was going to cost.  The patient stated that this is part of what is wrong with [place] and how we have doctors want to make money out of the little man.  I tried to explain to him that I work with a Social worker and I do not obtaining anything extra from rendering services to him.  The patient kept talking and use several expletives to voice his frustration.  He stated he was going to sign AMA and I told him that he could be endangering his life.  I talked to Dr. Darl Householder and he stated he was going to stay for 3 days.  I returned to the room, asked the patient to a stick to medical questions and let me do my job.  He once again started complaining of multiple things about the system and stating how he was (expletive) by the [facility] doctors.  I once again asked him to let me do my job and the patient replied THEN DO YOUR ([place].  I told the patient that I was going to somebody else to admit him since he was being so verbally aggressive towards me.  I proceeded to notify the flow manager to have somebody else admit him.  Tennis Must, MD.

## 2020-12-28 NOTE — Progress Notes (Addendum)
ANTICOAGULATION CONSULT NOTE - Initial Consult  Pharmacy Consult for heparin Indication: pulmonary embolus  No Known Allergies  Patient Measurements: Height: 5\' 4"  (162.6 cm) Weight: 59 kg (130 lb) IBW/kg (Calculated) : 59.2 Heparin Dosing Weight: 59 kg  Vital Signs: Temp: 98 F (36.7 C) (11/20 1009) BP: 116/75 (11/20 1422) Pulse Rate: 77 (11/20 1422)  Labs: Recent Labs    12/28/20 1025 12/28/20 1305  HGB 10.7*  --   HCT 35.4*  --   PLT 162  --   CREATININE 4.77*  --   TROPONINIHS 13 12    Estimated Creatinine Clearance: 12.4 mL/min (A) (by C-G formula based on SCr of 4.77 mg/dL (H)).   Medical History: Past Medical History:  Diagnosis Date   Acute renal failure ([name])    Bipolar disorder, unspecified ([place])    Diabetes mellitus    Hyperlipidemia    Hypothyroidism     Medications:  Scheduled:   heparin  3,000 Units Intravenous Once    Assessment: 70 yo male presenting after near syncopal episode and shortness of breath.  D-dimer found to be elevated and VQ scan with wedge-shaped perfusion defects compatible with pulmonary embolus.  Unable to perform CTA given poor renal function.  No anticoagulation noted PTA.  Pharmacy consulted to dose heparin.  Today, 12/28/20 -D-dimer 0.92 (elevated) -Hgb 10.7, platelets 162 (last values in system from 2013)   Goal of Therapy:  Heparin level 0.3-0.7 units/ml Monitor platelets by anticoagulation protocol: Yes   Plan:  Give 3000 units bolus x 1 Start heparin infusion at 1000 units/hr Check anti-Xa level in 8 hours and daily while on heparin Continue to monitor H&H and platelets F/u ability to transition to oral anticoagulant  Dimple Nanas, PharmD 12/28/2020 3:43 PM

## 2020-12-28 NOTE — H&P (Signed)
History and Physical    Bradley Tran LNL:[id number] DOB: 1950/08/11 DOA: 12/28/2020  PCP: [facility]   Patient coming from: Local Store  Chief Complaint: Near syncope, SOB with exertion  HPI: Bradley Tran is a 70 y.o. male with medical history significant for HTN, DMT2, hypothyroidism who presented by EMS with near syncope while shopping at a local store. He denies LOC. He states he was walking around the store when he became very short of breath and felt weak.  He went down to the ground but did not fall or hit his head.  EMS was called he was brought to the hospital.  He was initially borderline hypotensive and was given IV fluids which improved his blood pressure.  Initial troponins were negative.  He does have history of chronic kidney disease and was found to have worsening renal function today.  VQ scan showed pulmonary embolism and patient was started on heparin.  He reports he has never had blood clots in the past.  He is not on anticoagulation at home.  Reports that he gets his medical care at the [facility] and wants to make sure when he gets medications prescribed that they can be filled at the [place]. not had any fever and denies any recent illness or travel. He has a history of smoking but quit over 10 years ago.  He denies alcohol or illicit drug use.  ED Course: Initially patient had blood pressure that was soft but did improve with IV fluid bolus in the emergency room.  He was found to have elevated D-dimer level and VQ scan is positive for PE.  Patient was started on heparin.  Sodium 141 potassium 4.0 chloride 112 bicarb 22 creatinine 4.77 from baseline of 1.30 BUN 37 glucose 291 alkaline phosphatase 73 AST 16 ALT 17.  Troponin 13 and repeat troponin 12 in the emergency room.  WBC 6800 hemoglobin 10.7 hematocrit 35.4 platelets 162,000.  D-dimer 0.92.  Does not appear the COVID test will be obtained to the emergency room will be ordered at this time.  Hospitalist service  asked to evaluate manage patient  Review of Systems:  General: Reports weakness, with near syncope. Denies fever, chills, weight loss, night sweats. Denies change in appetite HENT: Denies head trauma, headache, denies change in hearing, tinnitus.  Denies nasal bleeding.  Denies sore throat.  Denies difficulty swallowing Eyes: Denies blurry vision, pain in eye, drainage.  Denies discoloration of eyes. Neck: Denies pain.  Denies swelling.  Denies pain with movement. Cardiovascular: Denies chest pain, palpitations.  Denies edema.  Denies orthopnea Respiratory: Reports shortness of breath with exertion. Denies cough.  Denies wheezing.  Denies sputum production Gastrointestinal: Denies abdominal pain, swelling. Denies nausea, vomiting, diarrhea.  Denies melena.  Denies hematemesis. Musculoskeletal: Denies limitation of movement. Denies deformity or swelling. Denies arthralgias or myalgias. Genitourinary: Denies pelvic pain.  Denies urinary frequency or hesitancy.  Denies dysuria.  Skin: Denies rash.  Denies petechiae, purpura, ecchymosis. Neurological: Denies seizure activity. Denies paresthesia.  Denies slurred speech, drooping face. Denies visual change. Psychiatric: Denies depression, anxiety. Denies hallucinations.  Past Medical History:  Diagnosis Date   Acute renal failure ([name])    Bipolar disorder, unspecified ([place])    Diabetes mellitus    Hyperlipidemia    Hypothyroidism     Past Surgical History:  Procedure Laterality Date   OTHER SURGICAL HISTORY     lymph node removed from right arm    Social History  reports that he quit smoking  about 12 years ago. His smoking use included cigarettes. He has a 120.00 pack-year smoking history. He does not have any smokeless tobacco history on file. He reports that he does not currently use alcohol. He reports that he does not currently use drugs.  No Known Allergies  History reviewed. No pertinent family history.   Prior to Admission  medications   Medication Sig Start Date End Date Taking? Authorizing Provider  albuterol (PROVENTIL HFA) 108 (90 BASE) MCG/ACT inhaler Inhale 2 puffs into the lungs at bedtime.      [provider]  amLODipine (NORVASC) 5 MG tablet Take 5 mg by mouth daily.     [provider]  citalopram (CELEXA) 40 MG tablet 1/2 tab by mouth once daily     [provider]  clonazePAM (KLONOPIN) 0.5 MG tablet Take by mouth 2 (two) times daily.      [provider]  levothyroxine (SYNTHROID, LEVOTHROID) 150 MCG tablet Take 150 mcg by mouth daily.      [provider]    Physical Exam: Vitals:   12/28/20 1330 12/28/20 1400 12/28/20 1422 12/28/20 1615  BP: 106/68 110/69 116/75 112/77  Pulse: 97 93 77 82  Resp: (!) 25 (!) 23 16 20   Temp:      SpO2: 98% 99% 97% 95%  Weight:      Height:        Constitutional: NAD, calm, comfortable Vitals:   12/28/20 1330 12/28/20 1400 12/28/20 1422 12/28/20 1615  BP: 106/68 110/69 116/75 112/77  Pulse: 97 93 77 82  Resp: (!) 25 (!) 23 16 20   Temp:      SpO2: 98% 99% 97% 95%  Weight:      Height:       General: WDWN, Alert and oriented x3.  Eyes: EOMI, PERRL, conjunctivae normal. Sclera nonicteric HENT:  Universal/AT, external ears normal.  Nares patent without epistasis. Mucous membranes are moist. Posterior pharynx clear  Neck: Soft, normal range of motion, supple, no masses,Trachea midline Respiratory: clear to auscultation bilaterally, no wheezing, no crackles. Normal respiratory effort. No accessory muscle use.  CV:  RRR without murmur, rub or gallop. No peripheral edema.  Abdomen: Soft, no tenderness, nondistended, no rebound or guarding.  No masses palpated. Bowel sounds normoactive Musculoskeletal: FROM. no cyanosis. No joint deformity upper and lower extremities. Normal muscle tone.  Skin: Warm, dry, intact no rashes, lesions, ulcers. No induration Neurologic: CN 2-12 grossly intact.  Normal speech.  Sensation intact  to touch. Strength 5/5 in all extremities.   Psychiatric: Normal judgment and insight. Normal mood.    Labs on Admission: I have personally reviewed following labs and imaging studies  CBC: Recent Labs  Lab 12/28/20 1025  WBC 6.8  NEUTROABS 5.0  HGB 10.7*  HCT 35.4*  MCV 97.8  PLT 161    Basic Metabolic Panel: Recent Labs  Lab 12/28/20 1025  NA 141  K 4.0  CL 112*  CO2 22  GLUCOSE 291*  BUN 37*  CREATININE 4.77*  CALCIUM 8.9    GFR: Estimated Creatinine Clearance: 12.4 mL/min (A) (by C-G formula based on SCr of 4.77 mg/dL (H)).  Liver Function Tests: Recent Labs  Lab 12/28/20 1025  AST 16  ALT 17  ALKPHOS 73  BILITOT 0.6  PROT 6.6  ALBUMIN 3.7    Urine analysis:    Component Value Date/Time   COLORURINE YELLOW 01/13/2010 [place] 01/13/2010 0944   LABSPEC 1.005 01/13/2010 [place]  6.0 01/13/2010 0944   GLUCOSEU NEGATIVE 01/13/2010 0944   HGBUR NEGATIVE 01/13/2010 0944   BILIRUBINUR NEGATIVE 01/13/2010 0944   KETONESUR NEGATIVE 01/13/2010 0944   PROTEINUR NEGATIVE 01/13/2010 0944   UROBILINOGEN 0.2 01/13/2010 0944   NITRITE NEGATIVE 01/13/2010 0944   LEUKOCYTESUR  01/13/2010 0944    NEGATIVE MICROSCOPIC NOT DONE ON URINES WITH NEGATIVE PROTEIN, BLOOD, LEUKOCYTES, NITRITE, OR GLUCOSE <1000 mg/dL.    Radiological Exams on Admission: DG Chest 2 View  Result Date: 12/28/2020 CLINICAL DATA:  70 year old male with weakness and dizziness. Near syncope. EXAM: CHEST - 2 VIEW COMPARISON:  Chest radiographs 02/22/2013 and earlier. FINDINGS: Lung volumes and mediastinal contours are stable. Mild chronic pulmonary hyperinflation. Attenuation of upper lobe bronchovascular markings suspicious for emphysema. Visualized tracheal air column is within normal limits. No pneumothorax, pulmonary edema, pleural effusion or acute pulmonary opacity. Mild left lung base scarring. No acute osseous abnormality identified. Osteopenia. Negative visible  bowel gas. IMPRESSION: 1. No acute cardiopulmonary abnormality. 2. Suspected emphysema. Electronically Signed   By: Genevie Ann M.D.   On: 12/28/2020 10:50   NM Pulmonary Perfusion  Result Date: 12/28/2020 CLINICAL DATA:  Positive D-dimer, weakness and dizziness, near syncope EXAM: NUCLEAR MEDICINE PERFUSION LUNG SCAN TECHNIQUE: Perfusion images were obtained in multiple projections after intravenous injection of radiopharmaceutical. Ventilation scans intentionally deferred if perfusion scan and chest x-Guardino adequate for interpretation during COVID 19 epidemic. RADIOPHARMACEUTICALS:  3.6 mCi Tc-70m MAA IV COMPARISON:  12/28/2020 FINDINGS: Planar images in multiple projections were obtained after radiotracer administration. There are large wedge-shaped areas of perfusion defects involving the left lower lobe, with a smaller wedge-shaped perfusion defect within the region of the lingula. There are no corresponding x-Miyazaki abnormalities. Findings are consistent with pulmonary embolus based on PISAPED criteria. Normal perfusion throughout the right lung. IMPRESSION: 1. Wedge-shaped perfusion defects within the lingula and left lower lobe compatible with pulmonary embolus. Critical Value/emergent results were called by telephone at the time of interpretation on 12/28/2020 at 3:40 pm to provider DR Darl Householder, who verbally acknowledged these results. Electronically Signed   By: Randa Ngo M.D.   On: 12/28/2020 15:46    EKG: Independently reviewed.  EKG shows sinus tachycardia with right bundle branch block.  No acute ST elevation or depression.  QTc prolonged at 497  Assessment/Plan Principal Problem:   Pulmonary embolism  Bradley Tran is placed on progressive floor for observation.  Started on heparin infusion in the emergency room which will be continued overnight for anticoagulation.  The patient is stable in the morning when he transition to p.o. anticoagulation.  He request that medication he is provided will be able to  be filled at the [place] since he goes there for his medical care and prescriptions. IV fluid hydration with LR at 60 ml/hr overnight Oxycodone as needed for pain control.   Active Problems:   Acute kidney injury superimposed on chronic kidney disease IVF hydration with LR.  Recheck renal function and electrolytes in am.     DM (diabetes mellitus), type 2 with renal complications  Continue basal insulin at 20 units a day that he reports he takes Check HgbA1c Monitor glucose and provided corrective insulin as needed for glycemic control.     Near syncope Placed on fall precautions. Is due to PE and resolved at this time. Will have pt get up with assistance overnight to prevent falls.    Prolonged QT interval Avoid medications or could further prolong QT interval   DVT prophylaxis: Pt has been  placed on Heparin infusion for anticoagulation with PE and AKI  Code Status:   Full Code  Family Communication:  Diagnosis plan discussed with patient.  Patient verbalized understanding and agrees with plan.  Further recommendations to follow as clinical indicated Disposition Plan:   Patient is from:  Home  Anticipated DC to:  Home  Anticipated DC date:  Anticipate less than 2 midnight stay  Admission status:  Observation   Yevonne Aline Andreu Drudge MD [facility]  How to contact the [facility] Attending or Consulting provider [place] or covering provider during after hours [place], for this patient?   Check the care team in [facility] and look for a) attending/consulting [facility] provider listed and b) the [facility] team listed Log into [email or web address] and use [place] universal password to access. If you do not have the password, please contact the hospital operator. Locate the [facility] provider you are looking for under [facility] and page to a number that you can be directly reached. If you still have difficulty reaching the provider, please page the [facility] (Director on Call) for the Hospitalists listed on amion  for assistance.  12/28/2020, 7:44 PM

## 2020-12-28 NOTE — ED Notes (Signed)
Pt given meal tray.

## 2020-12-28 NOTE — ED Notes (Signed)
MD Yao at bedside 

## 2020-12-28 NOTE — ED Notes (Signed)
Pt given water 

## 2020-12-28 NOTE — ED Notes (Signed)
Pt NAD in bed, a/ox4. Speaking in full and complete sentences. Pt states he became weak and winded while out shopping today and sat down, feeling like he may pass out. Denies syncope, CP, current SOB. LS dim throughout.

## 2020-12-28 NOTE — ED Provider Notes (Addendum)
  Physical Exam  BP 112/77   Pulse 82   Temp 98 F (36.7 C)   Resp 20   Ht 5\' 4"  (1.626 m)   Wt 59 kg   SpO2 95%   BMI 22.31 kg/m   Physical Exam  ED Course/Procedures     Procedures  MDM  Care assumed at 3 PM.  Patient is here with near syncope.  Patient has baseline creatinine of 4.  Patient was found to be borderline hypotensive that improved with IV fluids and troponins were negative.  Due to his renal failure, VQ scan was ordered and was pending at signout  5 pm VQ scan showed V/Q mismatch consistent with PE. I ordered heparin.  Hospitalist to admit for PE  5:54 PM Hospitalist came to talk to me that patient apparently refused admission and signed out Bradley Tran.  6:01 PM I talked to patient. He is willing to stay now. He is concerned about cost and I suggested that he can talk to billing or social work tomorrow in the hospital   CRITICAL CARE Performed by: Wandra Arthurs   Total critical care time: 30 minutes  Critical care time was exclusive of separately billable procedures and treating other patients.  Critical care was necessary to treat or prevent imminent or life-threatening deterioration.  Critical care was time spent personally by me on the following activities: development of treatment plan with patient and/or surrogate as well as nursing, discussions with consultants, evaluation of patient's response to treatment, examination of patient, obtaining history from patient or surrogate, ordering and performing treatments and interventions, ordering and review of laboratory studies, ordering and review of radiographic studies, pulse oximetry and re-evaluation of patient's condition.       Drenda Freeze, MD 12/28/20 1727    Drenda Freeze, MD 12/28/20 1754    Drenda Freeze, MD 12/28/20 [phone]

## 2020-12-28 NOTE — ED Notes (Signed)
Pt taken for lung scan.

## 2020-12-28 NOTE — ED Notes (Signed)
MD Olevia Bowens at bedside to discuss AMA vs Admit with pt

## 2020-12-29 ENCOUNTER — Observation Stay (HOSPITAL_COMMUNITY): Payer: Medicare Other

## 2020-12-29 DIAGNOSIS — I2609 Other pulmonary embolism with acute cor pulmonale: Secondary | ICD-10-CM | POA: Diagnosis not present

## 2020-12-29 DIAGNOSIS — R9431 Abnormal electrocardiogram [ECG] [EKG]: Secondary | ICD-10-CM | POA: Diagnosis not present

## 2020-12-29 DIAGNOSIS — Z87891 Personal history of nicotine dependence: Secondary | ICD-10-CM | POA: Diagnosis not present

## 2020-12-29 DIAGNOSIS — N179 Acute kidney failure, unspecified: Secondary | ICD-10-CM | POA: Diagnosis present

## 2020-12-29 DIAGNOSIS — R55 Syncope and collapse: Secondary | ICD-10-CM | POA: Diagnosis not present

## 2020-12-29 DIAGNOSIS — I2699 Other pulmonary embolism without acute cor pulmonale: Secondary | ICD-10-CM | POA: Diagnosis not present

## 2020-12-29 DIAGNOSIS — I129 Hypertensive chronic kidney disease with stage 1 through stage 4 chronic kidney disease, or unspecified chronic kidney disease: Secondary | ICD-10-CM | POA: Diagnosis present

## 2020-12-29 DIAGNOSIS — E039 Hypothyroidism, unspecified: Secondary | ICD-10-CM | POA: Diagnosis present

## 2020-12-29 DIAGNOSIS — E1122 Type 2 diabetes mellitus with diabetic chronic kidney disease: Secondary | ICD-10-CM | POA: Diagnosis not present

## 2020-12-29 DIAGNOSIS — N184 Chronic kidney disease, stage 4 (severe): Secondary | ICD-10-CM

## 2020-12-29 DIAGNOSIS — I959 Hypotension, unspecified: Secondary | ICD-10-CM | POA: Diagnosis present

## 2020-12-29 DIAGNOSIS — Z79890 Hormone replacement therapy (postmenopausal): Secondary | ICD-10-CM | POA: Diagnosis not present

## 2020-12-29 DIAGNOSIS — F319 Bipolar disorder, unspecified: Secondary | ICD-10-CM | POA: Diagnosis present

## 2020-12-29 DIAGNOSIS — Z79899 Other long term (current) drug therapy: Secondary | ICD-10-CM | POA: Diagnosis not present

## 2020-12-29 DIAGNOSIS — Z20822 Contact with and (suspected) exposure to covid-19: Secondary | ICD-10-CM | POA: Diagnosis present

## 2020-12-29 LAB — ECHOCARDIOGRAM COMPLETE
Area-P 1/2: 4.17 cm2
Height: 64 in
S' Lateral: 2.9 cm
Weight: 2080 oz

## 2020-12-29 LAB — HIV ANTIBODY (ROUTINE TESTING W REFLEX): HIV Screen 4th Generation wRfx: NONREACTIVE

## 2020-12-29 LAB — CBG MONITORING, ED
Glucose-Capillary: 123 mg/dL — ABNORMAL HIGH (ref 70–99)
Glucose-Capillary: 133 mg/dL — ABNORMAL HIGH (ref 70–99)
Glucose-Capillary: 164 mg/dL — ABNORMAL HIGH (ref 70–99)
Glucose-Capillary: 91 mg/dL (ref 70–99)

## 2020-12-29 LAB — BASIC METABOLIC PANEL
Anion gap: 5 (ref 5–15)
BUN: 37 mg/dL — ABNORMAL HIGH (ref 8–23)
CO2: 23 mmol/L (ref 22–32)
Calcium: 9.2 mg/dL (ref 8.9–10.3)
Chloride: 114 mmol/L — ABNORMAL HIGH (ref 98–111)
Creatinine, Ser: 4.65 mg/dL — ABNORMAL HIGH (ref 0.61–1.24)
GFR, Estimated: 13 mL/min — ABNORMAL LOW (ref >60)
Glucose, Bld: 132 mg/dL — ABNORMAL HIGH (ref 70–99)
Potassium: 4.3 mmol/L (ref 3.5–5.1)
Sodium: 142 mmol/L (ref 135–145)

## 2020-12-29 LAB — CBC
HCT: 32.9 % — ABNORMAL LOW (ref 39.0–52.0)
Hemoglobin: 10.2 g/dL — ABNORMAL LOW (ref 13.0–17.0)
MCH: 30.4 pg (ref 26.0–34.0)
MCHC: 31 g/dL (ref 30.0–36.0)
MCV: 97.9 fL (ref 80.0–100.0)
Platelets: 161 10*3/uL (ref 150–400)
RBC: 3.36 MIL/uL — ABNORMAL LOW (ref 4.22–5.81)
RDW: 13.6 % (ref 11.5–15.5)
WBC: 8 10*3/uL (ref 4.0–10.5)
nRBC: 0 % (ref 0.0–0.2)

## 2020-12-29 LAB — GLUCOSE, CAPILLARY: Glucose-Capillary: 80 mg/dL (ref 70–99)

## 2020-12-29 LAB — HEPARIN LEVEL (UNFRACTIONATED)
Heparin Unfractionated: >1.1 IU/mL — ABNORMAL HIGH (ref 0.30–0.70)
Heparin Unfractionated: 0.25 IU/mL — ABNORMAL LOW (ref 0.30–0.70)
Heparin Unfractionated: <0.1 IU/mL — ABNORMAL LOW (ref 0.30–0.70)

## 2020-12-29 LAB — HEMOGLOBIN A1C
Hgb A1c MFr Bld: 6.6 % — ABNORMAL HIGH (ref 4.8–5.6)
Mean Plasma Glucose: 142.72 mg/dL

## 2020-12-29 MED ORDER — LACTATED RINGERS IV SOLN: INTRAVENOUS | Status: DC

## 2020-12-29 MED ORDER — INSULIN ASPART 100 UNIT/ML IJ SOLN
0.0000 [IU] | Freq: Every day | INTRAMUSCULAR | Status: DC
  Filled 2020-12-29: qty 0.05

## 2020-12-29 MED ORDER — HEPARIN (PORCINE) 25000 UT/250ML-% IV SOLN: 850.0000 [IU]/h | INTRAVENOUS | Status: DC

## 2020-12-29 MED ORDER — ALBUTEROL SULFATE (2.5 MG/3ML) 0.083% IN NEBU
3.0000 mL | INHALATION_SOLUTION | Freq: Every day | RESPIRATORY_TRACT | Status: DC
  Administered 2020-12-29 (×2): 3 mL via RESPIRATORY_TRACT
  Filled 2020-12-29 (×2): qty 3

## 2020-12-29 MED ORDER — ACETAMINOPHEN 650 MG RE SUPP: 650.0000 mg | Freq: Four times a day (QID) | RECTAL | Status: DC | PRN

## 2020-12-29 MED ORDER — INSULIN GLARGINE-YFGN 100 UNIT/ML ~~LOC~~ SOLN
20.0000 [IU] | Freq: Every day | SUBCUTANEOUS | Status: DC
  Administered 2020-12-29 – 2020-12-30 (×2): 20 [IU] via SUBCUTANEOUS
  Filled 2020-12-29 (×2): qty 0.2

## 2020-12-29 MED ORDER — LEVOTHYROXINE SODIUM 100 MCG PO TABS
100.0000 ug | ORAL_TABLET | Freq: Every day | ORAL | Status: DC
  Administered 2020-12-29 – 2020-12-30 (×2): 100 ug via ORAL
  Filled 2020-12-29 (×2): qty 1

## 2020-12-29 MED ORDER — OXYCODONE HCL 5 MG PO TABS: 5.0000 mg | ORAL_TABLET | Freq: Four times a day (QID) | ORAL | Status: DC | PRN

## 2020-12-29 MED ORDER — INSULIN ASPART 100 UNIT/ML IJ SOLN
0.0000 [IU] | Freq: Three times a day (TID) | INTRAMUSCULAR | Status: DC
  Administered 2020-12-29: 2 [IU] via SUBCUTANEOUS
  Administered 2020-12-29 – 2020-12-30 (×2): 1 [IU] via SUBCUTANEOUS
  Filled 2020-12-29: qty 0.09

## 2020-12-29 MED ORDER — AMLODIPINE BESYLATE 5 MG PO TABS: 5.0000 mg | ORAL_TABLET | Freq: Every day | ORAL | Status: DC

## 2020-12-29 MED ORDER — ACETAMINOPHEN 325 MG PO TABS
650.0000 mg | ORAL_TABLET | Freq: Four times a day (QID) | ORAL | Status: DC | PRN
  Administered 2020-12-29: 650 mg via ORAL

## 2020-12-29 MED ORDER — HEPARIN (PORCINE) 25000 UT/250ML-% IV SOLN
1050.0000 [IU]/h | INTRAVENOUS | Status: AC
  Administered 2020-12-29 (×2): 950 [IU]/h via INTRAVENOUS
  Filled 2020-12-29: qty 250

## 2020-12-29 MED ORDER — HEPARIN (PORCINE) 25000 UT/250ML-% IV SOLN
850.0000 [IU]/h | INTRAVENOUS | Status: DC
  Administered 2020-12-29: 850 [IU]/h via INTRAVENOUS

## 2020-12-29 NOTE — Progress Notes (Signed)
ANTICOAGULATION CONSULT NOTE   Pharmacy Consult for heparin Indication: pulmonary embolus  No Known Allergies  Patient Measurements: Height: 5\' 4"  (162.6 cm) Weight: 59 kg (130 lb) IBW/kg (Calculated) : 59.2 Heparin Dosing Weight: 59 kg  Vital Signs: BP: 132/89 (11/21 1200) Pulse Rate: 97 (11/21 1200)  Labs: Recent Labs    12/28/20 1025 12/28/20 1305 12/29/20 0045 12/29/20 0500 12/29/20 1030  HGB 10.7*  --   --  10.2*  --   HCT 35.4*  --   --  32.9*  --   PLT 162  --   --  161  --   HEPARINUNFRC  --   --  >1.10*  --  0.25*  CREATININE 4.77*  --   --  4.65*  --   TROPONINIHS 13 12  --   --   --      Estimated Creatinine Clearance: 12.7 mL/min (A) (by C-G formula based on SCr of 4.65 mg/dL (H)).   Assessment: 70 yo male presenting after near syncopal episode and shortness of breath.  D-dimer found to be elevated and VQ scan with wedge-shaped perfusion defects compatible with pulmonary embolus.  Unable to perform CTA given poor renal function.  No anticoagulation noted PTA.  Pharmacy consulted to dose heparin.  Today, 12/29/20 HL > 1.1 on 1000 units/hr> heparin held x 1 hour and rate reduced to 850 units/hr and f/u HL is slightly low at 0.25 No bleeding reported Hg low but stable at 10.2. Plt WNL  Goal of Therapy:  Heparin level 0.3-0.7 units/ml Monitor platelets by anticoagulation protocol: Yes   Plan:   Increase heparin 950 units/hr Check heparin level in 8 hours Daily CBC & daily heparin level F/u transition to [place], Pharm.D 12/29/2020 12:36 PM

## 2020-12-29 NOTE — ED Notes (Signed)
Per Bradley Tran RPH, hold heparin x 1 hour and then resume at 850u/hour/ 8.5 ml/hour. Heparin stopped at this time.

## 2020-12-29 NOTE — Progress Notes (Signed)
  Echocardiogram 2D Echocardiogram has been performed.  Bradley Tran M 12/29/2020, 8:01 AM

## 2020-12-29 NOTE — ED Notes (Signed)
Pt states he may want to leave AMA because no one can tell him how much this hospital stay will cost him personally and how long he will be in the hospital .

## 2020-12-29 NOTE — ED Notes (Signed)
MD Olevia Bowens made aware

## 2020-12-29 NOTE — Progress Notes (Signed)
ANTICOAGULATION CONSULT NOTE - Initial Consult  Pharmacy Consult for heparin Indication: pulmonary embolus  No Known Allergies  Patient Measurements: Height: 5\' 4"  (162.6 cm) Weight: 59 kg (130 lb) IBW/kg (Calculated) : 59.2 Heparin Dosing Weight: 59 kg  Vital Signs: BP: 106/70 (11/20 2253) Pulse Rate: 83 (11/20 2253)  Labs: Recent Labs    12/28/20 1025 12/28/20 1305 12/29/20 0045  HGB 10.7*  --   --   HCT 35.4*  --   --   PLT 162  --   --   HEPARINUNFRC  --   --  >1.10*  CREATININE 4.77*  --   --   TROPONINIHS 13 12  --      Estimated Creatinine Clearance: 12.4 mL/min (A) (by C-G formula based on SCr of 4.77 mg/dL (H)).   Medical History: Past Medical History:  Diagnosis Date   Acute renal failure ([name])    Bipolar disorder, unspecified ([place])    Diabetes mellitus    Hyperlipidemia    Hypothyroidism     Medications:  Scheduled:   citalopram  20 mg Oral Daily   clonazePAM  0.5 mg Oral BID    Assessment: 70 yo male presenting after near syncopal episode and shortness of breath.  D-dimer found to be elevated and VQ scan with wedge-shaped perfusion defects compatible with pulmonary embolus.  Unable to perform CTA given poor renal function.  No anticoagulation noted PTA.  Pharmacy consulted to dose heparin.  Today, 12/29/20 HL > 1.1 on 1000 units/hr Confirmed with RN drew correctly and no bleeding  Goal of Therapy:  Heparin level 0.3-0.7 units/ml Monitor platelets by anticoagulation protocol: Yes   Plan:  Hold heparin drip x 1 hour Resume heparin at 850 units/hr Check heparin level in 8 hours Daily CBC  Dolly Rias RPh 12/29/2020, 1:34 AM

## 2020-12-29 NOTE — Progress Notes (Signed)
PROGRESS NOTE    Bradley Tran  ZGY:[id number] DOB: 07/05/1950 DOA: 12/28/2020 PCP: [facility]   Brief Narrative:  Bradley Tran is a 70 y.o. male with medical history significant for HTN, DMT2, hypothyroidism CKD stage 4, who presented by EMS with near syncope while shopping at a local store with symptoms of shortness of breath and profound weakness - patient was able to safely drop to the floor without trauma. In ED VQ scan notable for PE. Placed on heparin drip.  Assessment & Plan:   Acute presyncopal event secondary to pulmonary embolism, POA VQ positive for mismatch Continue heparin drip Echocardiogram pending for further evaluation of heart strain Patient is not hypoxic, symptoms otherwise moderately well controlled at this time   Acute kidney injury superimposed on chronic kidney disease 4 Most recent labs in our system are 70 years old, currently follows with the [place], unknown baseline although downtrending Continue IV fluids, increase p.o. intake  DM (diabetes mellitus), type 2 with renal complications, moderately well controlled Continue basal insulin at 20 units a day that he reports he takes Monitor glucose and provided corrective insulin as needed for glycemic control.    Prolonged QT interval Avoid medications or could further prolong QT interval  DVT prophylaxis: Pt has been placed on Heparin infusion for anticoagulation with PE and AKI  Code Status: Full Code  Family Communication: None present  Status is: Inpatient  Dispo: The patient is from: Home              Anticipated d/c is to: Home              Anticipated d/c date is: 24 to 48 hours              Patient currently not medically stable for discharge  Consultants:  None  Procedures:  None  Antimicrobials:  None  Subjective: No acute issues or events overnight, denies chest pain shortness of breath nausea vomiting diarrhea constipation headache fevers chills chest  pain.  Objective: Vitals:   12/28/20 2253 12/29/20 0200 12/29/20 0451 12/29/20 0600  BP: 106/70 (!) 107/59 (!) 132/91 (!) 142/90  Pulse: 83 70 84 91  Resp: 16 20 20  (!) 22  Temp:      SpO2: 93% 93% 96% 95%  Weight:      Height:       No intake or output data in the 24 hours ending 12/29/20 0736 Filed Weights   12/28/20 1011  Weight: 59 kg    Examination:  General exam: Appears calm and comfortable  Respiratory system: Clear to auscultation. Respiratory effort normal. Cardiovascular system: S1 & S2 heard, RRR. No JVD, murmurs, rubs, gallops or clicks. No pedal edema. Gastrointestinal system: Abdomen is nondistended, soft and nontender. No organomegaly or masses felt. Normal bowel sounds heard. Central nervous system: Alert and oriented. No focal neurological deficits. Extremities: Symmetric 5 x 5 power. Skin: No rashes, lesions or ulcers Psychiatry: Judgement and insight appear normal. Mood & affect appropriate.     Data Reviewed: I have personally reviewed following labs and imaging studies  CBC: Recent Labs  Lab 12/28/20 1025 12/29/20 0500  WBC 6.8 8.0  NEUTROABS 5.0  --   HGB 10.7* 10.2*  HCT 35.4* 32.9*  MCV 97.8 97.9  PLT 162 591   Basic Metabolic Panel: Recent Labs  Lab 12/28/20 1025 12/29/20 0500  NA 141 142  K 4.0 4.3  CL 112* 114*  CO2 22 23  GLUCOSE 291* 132*  BUN 37*  37*  CREATININE 4.77* 4.65*  CALCIUM 8.9 9.2   GFR: Estimated Creatinine Clearance: 12.7 mL/min (A) (by C-G formula based on SCr of 4.65 mg/dL (H)). Liver Function Tests: Recent Labs  Lab 12/28/20 1025  AST 16  ALT 17  ALKPHOS 73  BILITOT 0.6  PROT 6.6  ALBUMIN 3.7   No results for input(s): LIPASE, AMYLASE in the last 168 hours. No results for input(s): AMMONIA in the last 168 hours. Coagulation Profile: No results for input(s): INR, PROTIME in the last 168 hours. Cardiac Enzymes: No results for input(s): CKTOTAL, CKMB, CKMBINDEX, TROPONINI in the last 168  hours. BNP (last 3 results) No results for input(s): PROBNP in the last 8760 hours. HbA1C: Recent Labs    12/29/20 0452  HGBA1C 6.6*   CBG: Recent Labs  Lab 12/29/20 0210  GLUCAP 123*   Lipid Profile: No results for input(s): CHOL, HDL, LDLCALC, TRIG, CHOLHDL, LDLDIRECT in the last 72 hours. Thyroid Function Tests: No results for input(s): TSH, T4TOTAL, FREET4, T3FREE, THYROIDAB in the last 72 hours. Anemia Panel: No results for input(s): VITAMINB12, FOLATE, FERRITIN, TIBC, IRON, RETICCTPCT in the last 72 hours. Sepsis Labs: No results for input(s): PROCALCITON, LATICACIDVEN in the last 168 hours.  Recent Results (from the past 240 hour(s))  Resp Panel by RT-PCR (Flu A&B, Covid) Nasopharyngeal Swab     Status: None   Collection Time: 12/28/20  9:00 PM   Specimen: Nasopharyngeal Swab; Nasopharyngeal(NP) swabs in vial transport medium  Result Value Ref Range Status   SARS Coronavirus 2 by RT PCR NEGATIVE NEGATIVE Final    Comment: (NOTE) SARS-CoV-2 target nucleic acids are NOT DETECTED.  The SARS-CoV-2 RNA is generally detectable in upper respiratory specimens during the acute phase of infection. The lowest concentration of SARS-CoV-2 viral copies this assay can detect is 138 copies/mL. A negative result does not preclude SARS-Cov-2 infection and should not be used as the sole basis for treatment or other patient management decisions. A negative result may occur with  improper specimen collection/handling, submission of specimen other than nasopharyngeal swab, presence of viral mutation(s) within the areas targeted by this assay, and inadequate number of viral copies(<138 copies/mL). A negative result must be combined with clinical observations, patient history, and epidemiological information. The expected result is Negative.  Fact Sheet for Patients:  EntrepreneurPulse.com.au  Fact Sheet for Healthcare Providers:   IncredibleEmployment.be  This test is no t yet approved or cleared by the [place] FDA and  has been authorized for detection and/or diagnosis of SARS-CoV-2 by FDA under an Emergency Use Authorization (EUA). This EUA will remain  in effect (meaning this test can be used) for the duration of the COVID-19 declaration under Section 564(b)(1) of the Act, 21 U.S.C.section 360bbb-3(b)(1), unless the authorization is terminated  or revoked sooner.       Influenza A by PCR NEGATIVE NEGATIVE Final   Influenza B by PCR NEGATIVE NEGATIVE Final    Comment: (NOTE) The Xpert Xpress SARS-CoV-2/FLU/RSV plus assay is intended as an aid in the diagnosis of influenza from Nasopharyngeal swab specimens and should not be used as a sole basis for treatment. Nasal washings and aspirates are unacceptable for Xpert Xpress SARS-CoV-2/FLU/RSV testing.  Fact Sheet for Patients: EntrepreneurPulse.com.au  Fact Sheet for Healthcare Providers: IncredibleEmployment.be  This test is not yet approved or cleared by the [place] FDA and has been authorized for detection and/or diagnosis of SARS-CoV-2 by FDA under an Emergency Use Authorization (EUA). This EUA will remain in effect (meaning  this test can be used) for the duration of the COVID-19 declaration under Section 564(b)(1) of the Act, 21 U.S.C. section 360bbb-3(b)(1), unless the authorization is terminated or revoked.  Performed at [facility], [place] [address] [place], [place] [address]          Radiology Studies: DG Chest 2 View  Result Date: 12/28/2020 CLINICAL DATA:  71 year old male with weakness and dizziness. Near syncope. EXAM: CHEST - 2 VIEW COMPARISON:  Chest radiographs 02/22/2013 and earlier. FINDINGS: Lung volumes and mediastinal contours are stable. Mild chronic pulmonary hyperinflation. Attenuation of upper lobe bronchovascular markings suspicious for  emphysema. Visualized tracheal air column is within normal limits. No pneumothorax, pulmonary edema, pleural effusion or acute pulmonary opacity. Mild left lung base scarring. No acute osseous abnormality identified. Osteopenia. Negative visible bowel gas. IMPRESSION: 1. No acute cardiopulmonary abnormality. 2. Suspected emphysema. Electronically Signed   By: Genevie Ann M.D.   On: 12/28/2020 10:50   NM Pulmonary Perfusion  Result Date: 12/28/2020 CLINICAL DATA:  Positive D-dimer, weakness and dizziness, near syncope EXAM: NUCLEAR MEDICINE PERFUSION LUNG SCAN TECHNIQUE: Perfusion images were obtained in multiple projections after intravenous injection of radiopharmaceutical. Ventilation scans intentionally deferred if perfusion scan and chest x-[place] adequate for interpretation during COVID 19 epidemic. RADIOPHARMACEUTICALS:  3.6 mCi Tc-44m MAA IV COMPARISON:  12/28/2020 FINDINGS: Planar images in multiple projections were obtained after radiotracer administration. There are large wedge-shaped areas of perfusion defects involving the left lower lobe, with a smaller wedge-shaped perfusion defect within the region of the lingula. There are no corresponding x-[place] abnormalities. Findings are consistent with pulmonary embolus based on PISAPED criteria. Normal perfusion throughout the right lung. IMPRESSION: 1. Wedge-shaped perfusion defects within the lingula and left lower lobe compatible with pulmonary embolus. Critical Value/emergent results were called by telephone at the time of interpretation on 12/28/2020 at 3:40 pm to provider DR Darl Householder, who verbally acknowledged these results. Electronically Signed   By: Randa Ngo M.D.   On: 12/28/2020 15:46     Scheduled Meds:  albuterol  3 mL Inhalation QHS   clonazePAM  0.5 mg Oral BID   insulin aspart  0-5 Units Subcutaneous QHS   insulin aspart  0-9 Units Subcutaneous TID WC   insulin glargine-yfgn  20 Units Subcutaneous Daily   levothyroxine  100 mcg Oral Q0600    Continuous Infusions:  heparin 850 Units/hr (12/29/20 0236)   lactated ringers 60 mL/hr at 12/29/20 0457     LOS: 0 days   Time spent: 76min  Jaimere C Marcquis Ridlon, DO [facility]  If 7PM-7AM, please contact night-coverage [email or web address]  12/29/2020, 7:36 AM

## 2020-12-29 NOTE — ED Notes (Signed)
Pt resting comfortable in bed at this time. All comfort and safety measures in place in room with bed in lowest position.   ?

## 2020-12-30 DIAGNOSIS — Z794 Long term (current) use of insulin: Secondary | ICD-10-CM

## 2020-12-30 DIAGNOSIS — E1122 Type 2 diabetes mellitus with diabetic chronic kidney disease: Secondary | ICD-10-CM

## 2020-12-30 DIAGNOSIS — R9431 Abnormal electrocardiogram [ECG] [EKG]: Secondary | ICD-10-CM

## 2020-12-30 DIAGNOSIS — R55 Syncope and collapse: Secondary | ICD-10-CM

## 2020-12-30 LAB — CBC
HCT: 33.9 % — ABNORMAL LOW (ref 39.0–52.0)
Hemoglobin: 10.3 g/dL — ABNORMAL LOW (ref 13.0–17.0)
MCH: 30.2 pg (ref 26.0–34.0)
MCHC: 30.4 g/dL (ref 30.0–36.0)
MCV: 99.4 fL (ref 80.0–100.0)
Platelets: 139 10*3/uL — ABNORMAL LOW (ref 150–400)
RBC: 3.41 MIL/uL — ABNORMAL LOW (ref 4.22–5.81)
RDW: 13.4 % (ref 11.5–15.5)
WBC: 6.6 10*3/uL (ref 4.0–10.5)
nRBC: 0 % (ref 0.0–0.2)

## 2020-12-30 LAB — GLUCOSE, CAPILLARY
Glucose-Capillary: 75 mg/dL (ref 70–99)
Glucose-Capillary: 135 mg/dL — ABNORMAL HIGH (ref 70–99)

## 2020-12-30 MED ORDER — HEPARIN BOLUS VIA INFUSION
1000.0000 [IU] | Freq: Once | INTRAVENOUS | Status: AC
Start: 2020-12-30 — End: 2020-12-30
  Administered 2020-12-30: 1000 [IU] via INTRAVENOUS
  Filled 2020-12-30: qty 1000

## 2020-12-30 MED ORDER — APIXABAN 5 MG PO TABS: 5.0000 mg | ORAL_TABLET | Freq: Two times a day (BID) | ORAL | 1 refills | Status: DC

## 2020-12-30 MED ORDER — APIXABAN 5 MG PO TABS: 10.0000 mg | ORAL_TABLET | Freq: Two times a day (BID) | ORAL | 0 refills | Status: DC

## 2020-12-30 MED ORDER — APIXABAN 5 MG PO TABS
10.0000 mg | ORAL_TABLET | Freq: Two times a day (BID) | ORAL | Status: DC
  Administered 2020-12-30: 10 mg via ORAL
  Filled 2020-12-30: qty 2

## 2020-12-30 MED ORDER — APIXABAN 5 MG PO TABS: 5.0000 mg | ORAL_TABLET | Freq: Two times a day (BID) | ORAL | Status: DC

## 2020-12-30 MED ORDER — APIXABAN (ELIQUIS) VTE STARTER PACK (10MG AND 5MG): ORAL_TABLET | ORAL | 0 refills | Status: DC

## 2020-12-30 NOTE — Progress Notes (Signed)
[place] for heparin --> Eliquis Indication: pulmonary embolus  No Known Allergies  Patient Measurements: Height: 5\' 4"  (162.6 cm) Weight: 59 kg (130 lb) IBW/kg (Calculated) : 59.2 Heparin Dosing Weight: 59 kg  Vital Signs: Temp: 98.5 F (36.9 C) (11/22 0501) Temp Source: Oral (11/22 0501) BP: 132/75 (11/22 0501) Pulse Rate: 75 (11/22 0501)  Labs: Recent Labs    12/28/20 1025 12/28/20 1305 12/29/20 0045 12/29/20 0500 12/29/20 1030 12/29/20 2122 12/30/20 0457  HGB 10.7*  --   --  10.2*  --   --  10.3*  HCT 35.4*  --   --  32.9*  --   --  33.9*  PLT 162  --   --  161  --   --  139*  HEPARINUNFRC  --   --  >1.10*  --  0.25* <0.10*  --   CREATININE 4.77*  --   --  4.65*  --   --   --   TROPONINIHS 13 12  --   --   --   --   --      Estimated Creatinine Clearance: 12.7 mL/min (A) (by C-G formula based on SCr of 4.65 mg/dL (H)).   Assessment: 70 yo male with CKD4 presented to the ED after near syncopal episode and shortness of breath.  D-dimer found to be elevated and VQ scan with wedge-shaped perfusion defects compatible with pulmonary embolus.  Unable to perform CTA given poor renal function.  He's currently on heparin drip. Pharmacy consulted on 11/22 to transition to Eliquis.  Goal of Therapy:  Heparin level 0.3-0.7 units/ml Monitor platelets by anticoagulation protocol: Yes   Plan:  - d/c heparin drip - start eliquis 10mg  bid x7 days, then 5 mg bid  - pharmacy will sign off, but will follow patient peripherally along with you  Dia Sitter, PharmD, BCPS 12/30/2020 10:38 AM

## 2020-12-30 NOTE — Progress Notes (Signed)
ANTICOAGULATION CONSULT NOTE   Pharmacy Consult for heparin Indication: pulmonary embolus  No Known Allergies  Patient Measurements: Height: 5\' 4"  (162.6 cm) Weight: 59 kg (130 lb) IBW/kg (Calculated) : 59.2 Heparin Dosing Weight: 59 kg  Vital Signs: Temp: 98.5 F (36.9 C) (11/22 0116) Temp Source: Oral (11/22 0116) BP: 128/80 (11/22 0116) Pulse Rate: 74 (11/22 0116)  Labs: Recent Labs    12/28/20 1025 12/28/20 1305 12/29/20 0045 12/29/20 0500 12/29/20 1030 12/29/20 2122  HGB 10.7*  --   --  10.2*  --   --   HCT 35.4*  --   --  32.9*  --   --   PLT 162  --   --  161  --   --   HEPARINUNFRC  --   --  >1.10*  --  0.25* <0.10*  CREATININE 4.77*  --   --  4.65*  --   --   TROPONINIHS 13 12  --   --   --   --      Estimated Creatinine Clearance: 12.7 mL/min (A) (by C-G formula based on SCr of 4.65 mg/dL (H)).   Assessment: 69 yo male presenting after near syncopal episode and shortness of breath.  D-dimer found to be elevated and VQ scan with wedge-shaped perfusion defects compatible with pulmonary embolus.  Unable to perform CTA given poor renal function.  No anticoagulation noted PTA.  Pharmacy consulted to dose heparin.  Today, 12/30/20 HL <0.1 - subtherapeutic on 950 units/hr & trending down Confirmed rate with RN and she reports no interruptions in heparin therapy. Also, no bleeding reported  Goal of Therapy:  Heparin level 0.3-0.7 units/ml Monitor platelets by anticoagulation protocol: Yes   Plan:   Re-bolus 1000 units IV x1 then slight increase to heparin 1050 units/hr Check heparin level in 8 hours Daily CBC & daily heparin level F/u transition to [employer], Pharm.D 12/30/2020 1:40 AM

## 2020-12-30 NOTE — Discharge Instructions (Signed)
Information on my medicine - ELIQUIS (apixaban)  This medication education was reviewed with me or my healthcare representative as part of my discharge preparation.    Why was Eliquis prescribed for you? Eliquis was prescribed to treat blood clots that may have been found in the veins of your legs (deep vein thrombosis) or in your lungs (pulmonary embolism) and to reduce the risk of them occurring again.  What do You need to know about Eliquis ? The starting dose is 10 mg (two 5 mg tablets) taken TWICE daily for the FIRST SEVEN (7) DAYS, then on 01/06/21  the dose is reduced to ONE 5 mg tablet taken TWICE daily.  Eliquis may be taken with or without food.   Try to take the dose about the same time in the morning and in the evening. If you have difficulty swallowing the tablet whole please discuss with your pharmacist how to take the medication safely.  Take Eliquis exactly as prescribed and DO NOT stop taking Eliquis without talking to the doctor who prescribed the medication.  Stopping may increase your risk of developing a new blood clot.  Refill your prescription before you run out.  After discharge, you should have regular check-up appointments with your healthcare provider that is prescribing your Eliquis.    What do you do if you miss a dose? If a dose of ELIQUIS is not taken at the scheduled time, take it as soon as possible on the same day and twice-daily administration should be resumed. The dose should not be doubled to make up for a missed dose.  Important Safety Information A possible side effect of Eliquis is bleeding. You should call your healthcare provider right away if you experience any of the following: Bleeding from an injury or your nose that does not stop. Unusual colored urine (red or dark brown) or unusual colored stools (red or black). Unusual bruising for unknown reasons. A serious fall or if you hit your head (even if there is no bleeding).  Some  medicines may interact with Eliquis and might increase your risk of bleeding or clotting while on Eliquis. To help avoid this, consult your healthcare provider or pharmacist prior to using any new prescription or non-prescription medications, including herbals, vitamins, non-steroidal anti-inflammatory drugs (NSAIDs) and supplements.  This website has more information on Eliquis (apixaban): http://www.eliquis.com/eliquis/home

## 2020-12-30 NOTE — Discharge Summary (Signed)
Physician Discharge Summary  Kiptyn Rafuse RFF:[id number] DOB: 03-10-1950 DOA: 12/28/2020  PCP: [facility]  Admit date: 12/28/2020 Discharge date: 12/30/2020  Admitted From: Home Disposition: Home  Recommendations for Outpatient Follow-up:  Follow up with PCP in 1-2 weeks Please obtain BMP/CBC in one week Please follow up with repeat imaging to evaluate PE in the next 3 to 6 months per PCP  Home Health: None Equipment/Devices: None  Discharge Condition: Stable CODE STATUS: Full Diet recommendation: Low-salt low-fat diabetic diet  Brief/Interim Summary: Bradley Tran is a 70 y.o. male with medical history significant for HTN, DMT2, hypothyroidism CKD stage 4, who presented by EMS with near syncope while shopping at a local store with symptoms of shortness of breath and profound weakness - patient was able to safely drop to the floor without trauma. In ED VQ scan notable for PE. Placed on heparin drip.  Patient found to have PE transition from heparin drip to p.o. Eliquis, echocardiogram unremarkable, no further events or issues with presyncope syncope, telemetry unremarkable for events as well.  Patient otherwise stable and agreeable for discharge close outpatient follow-up at the [facility] when he currently receives the majority of his care.   Assessment & Plan:   Acute presyncopal event secondary to pulmonary embolism, POA, resolved  Acute kidney injury superimposed on chronic kidney disease 4 Appears to be at baseline, follow-up with PCP as scheduled in the next 1 to 2 weeks   DM (diabetes mellitus), type 2 with renal complications, moderately well controlled Continue home regimen, follow-up with PCP as scheduled  Prolonged QT interval Avoid medications or could further prolong QT interval  Discharge Diagnoses:  Principal Problem:   Pulmonary embolism ([place]) Active Problems:   Acute kidney injury superimposed on chronic kidney disease ([name])   Prolonged QT interval   Near  syncope   DM (diabetes mellitus), type 2 with renal complications ([place])   Acute pulmonary embolism, unspecified pulmonary embolism type, unspecified whether acute cor pulmonale present [facility])    Discharge Instructions   Allergies as of 12/30/2020   No Known Allergies      Medication List     STOP taking these medications    amLODipine 5 MG tablet Commonly known as: NORVASC   citalopram 40 MG tablet Commonly known as: CELEXA       TAKE these medications    albuterol 108 (90 Base) MCG/ACT inhaler Commonly known as: VENTOLIN HFA Inhale 2 puffs into the lungs 4 (four) times daily as needed for shortness of breath or wheezing.   apixaban 5 MG Tabs tablet Commonly known as: ELIQUIS Take 2 tablets (10 mg total) by mouth 2 (two) times daily.   apixaban 5 MG Tabs tablet Commonly known as: ELIQUIS Take 1 tablet (5 mg total) by mouth 2 (two) times daily. Start taking on: January 06, 2021   atorvastatin 80 MG tablet Commonly known as: LIPITOR Take 40 mg by mouth daily.   calcitRIOL 0.25 MCG capsule Commonly known as: ROCALTROL Take 1 capsule by mouth daily.   Cholecalciferol 25 MCG (1000 UT) tablet Take 1 tablet by mouth daily.   clonazePAM 0.5 MG tablet Commonly known as: KLONOPIN Take by mouth 2 (two) times daily.   ferrous sulfate 325 (65 FE) MG tablet Take 1 tablet by mouth daily.   insulin glargine-yfgn 100 UNIT/ML injection Commonly known as: SEMGLEE Inject 20 Units into the skin daily.   levothyroxine 100 MCG tablet Commonly known as: SYNTHROID Take 100 mcg by mouth daily. What changed: Another  medication with the same name was removed. Continue taking this medication, and follow the directions you see here.   omeprazole 40 MG capsule Commonly known as: PRILOSEC Take 1 capsule by mouth daily.   QUEtiapine 200 MG tablet Commonly known as: SEROQUEL Take 200 mg by mouth in the morning, at noon, and at bedtime.   tamsulosin 0.4 MG Caps  capsule Commonly known as: FLOMAX Take 1 capsule by mouth at bedtime.   Tiotropium Bromide-Olodaterol 2.5-2.5 MCG/ACT Aers Inhale 2 puffs into the lungs daily.   vitamin B-12 500 MCG tablet Commonly known as: CYANOCOBALAMIN Take 1 tablet by mouth daily.        No Known Allergies  Consultations: None   Procedures/Studies: DG Chest 2 View  Result Date: 12/28/2020 CLINICAL DATA:  70 year old male with weakness and dizziness. Near syncope. EXAM: CHEST - 2 VIEW COMPARISON:  Chest radiographs 02/22/2013 and earlier. FINDINGS: Lung volumes and mediastinal contours are stable. Mild chronic pulmonary hyperinflation. Attenuation of upper lobe bronchovascular markings suspicious for emphysema. Visualized tracheal air column is within normal limits. No pneumothorax, pulmonary edema, pleural effusion or acute pulmonary opacity. Mild left lung base scarring. No acute osseous abnormality identified. Osteopenia. Negative visible bowel gas. IMPRESSION: 1. No acute cardiopulmonary abnormality. 2. Suspected emphysema. Electronically Signed   By: Genevie Ann M.D.   On: 12/28/2020 10:50   NM Pulmonary Perfusion  Result Date: 12/28/2020 CLINICAL DATA:  Positive D-dimer, weakness and dizziness, near syncope EXAM: NUCLEAR MEDICINE PERFUSION LUNG SCAN TECHNIQUE: Perfusion images were obtained in multiple projections after intravenous injection of radiopharmaceutical. Ventilation scans intentionally deferred if perfusion scan and chest x-Rennie adequate for interpretation during COVID 19 epidemic. RADIOPHARMACEUTICALS:  3.6 mCi Tc-2m MAA IV COMPARISON:  12/28/2020 FINDINGS: Planar images in multiple projections were obtained after radiotracer administration. There are large wedge-shaped areas of perfusion defects involving the left lower lobe, with a smaller wedge-shaped perfusion defect within the region of the lingula. There are no corresponding x-Medici abnormalities. Findings are consistent with pulmonary embolus  based on PISAPED criteria. Normal perfusion throughout the right lung. IMPRESSION: 1. Wedge-shaped perfusion defects within the lingula and left lower lobe compatible with pulmonary embolus. Critical Value/emergent results were called by telephone at the time of interpretation on 12/28/2020 at 3:40 pm to provider DR Darl Householder, who verbally acknowledged these results. Electronically Signed   By: Randa Ngo M.D.   On: 12/28/2020 15:46   ECHOCARDIOGRAM COMPLETE  Result Date: 12/29/2020    ECHOCARDIOGRAM REPORT   Patient Name:   Bradley Tran Date of Exam: 12/29/2020 Medical Rec #:  [id number]   Height:       64.0 in Accession #:    [id number]  Weight:       130.0 lb Date of Birth:  Apr 08, 1950    BSA:          1.629 m Patient Age:    70 years    BP:           141/76 mmHg Patient Gender: M           HR:           101 bpm. Exam Location:  Inpatient Procedure: 3D Echo, 2D Echo, Cardiac Doppler, Color Doppler and Strain Analysis Indications:    Pulmonary Embolus I26.09  History:        Patient has no prior history of Echocardiogram examinations.                 Risk Factors:Hypertension, Diabetes and Former Smoker.  Hypothyroidism. Chronic kidney disease. Suspected emphysema.  Sonographer:    Darlina Sicilian RDCS Referring Phys: [id number] [place]  Sonographer Comments: Image acquisition challenging due to respiratory motion. IMPRESSIONS  1. Left ventricular ejection fraction, by estimation, is 65 to 70%. The left ventricle has normal function. The left ventricle has no regional wall motion abnormalities. Left ventricular diastolic parameters are consistent with Grade I diastolic dysfunction (impaired relaxation).  2. Right ventricular systolic function is normal. The right ventricular size is mildly enlarged. Tricuspid regurgitation signal is inadequate for assessing PA pressure.  3. The mitral valve is grossly normal. Trivial mitral valve regurgitation.  4. The aortic valve is tricuspid. Aortic valve  regurgitation is not visualized.  5. The inferior vena cava is normal in size with greater than 50% respiratory variability, suggesting right atrial pressure of 3 mmHg. Comparison(s): No prior Echocardiogram. FINDINGS  Left Ventricle: Left ventricular ejection fraction, by estimation, is 65 to 70%. The left ventricle has normal function. The left ventricle has no regional wall motion abnormalities. 3D left ventricular ejection fraction analysis performed but not reported based on interpreter judgement due to suboptimal tracking. The left ventricular internal cavity size was normal in size. There is no left ventricular hypertrophy. Left ventricular diastolic parameters are consistent with Grade I diastolic dysfunction (impaired relaxation). Indeterminate filling pressures. Right Ventricle: The right ventricular size is mildly enlarged. No increase in right ventricular wall thickness. Right ventricular systolic function is normal. Tricuspid regurgitation signal is inadequate for assessing PA pressure. Left Atrium: Left atrial size was normal in size. Right Atrium: Right atrial size was normal in size. Pericardium: There is no evidence of pericardial effusion. Mitral Valve: The mitral valve is grossly normal. Trivial mitral valve regurgitation. Tricuspid Valve: The tricuspid valve is grossly normal. Tricuspid valve regurgitation is trivial. Aortic Valve: The aortic valve is tricuspid. Aortic valve regurgitation is not visualized. Pulmonic Valve: The pulmonic valve was normal in structure. Pulmonic valve regurgitation is not visualized. Aorta: The aortic root and ascending aorta are structurally normal, with no evidence of dilitation. Venous: The inferior vena cava is normal in size with greater than 50% respiratory variability, suggesting right atrial pressure of 3 mmHg. IAS/Shunts: No atrial level shunt detected by color flow Doppler.  LEFT VENTRICLE PLAX 2D LVIDd:         5.00 cm   Diastology LVIDs:         2.90 cm    LV e' medial:    8.81 cm/s LV PW:         0.80 cm   LV E/e' medial:  7.5 LV IVS:        0.80 cm   LV e' lateral:   8.05 cm/s LVOT diam:     1.90 cm   LV E/e' lateral: 8.2 LV SV:         28 LV SV Index:   17 LVOT Area:     2.84 cm                           3D Volume EF:                          3D EF:        54 %                          LV EDV:  91 ml                          LV ESV:       42 ml                          LV SV:        49 ml RIGHT VENTRICLE RV S prime:     17.25 cm/s TAPSE (M-mode): 1.6 cm LEFT ATRIUM             Index        RIGHT ATRIUM          Index LA diam:        2.70 cm 1.66 cm/m   RA Area:     9.56 cm LA Vol (A2C):   28.9 ml 17.74 ml/m  RA Volume:   16.50 ml 10.13 ml/m LA Vol (A4C):   9.4 ml  5.74 ml/m LA Biplane Vol: 17.6 ml 10.80 ml/m  AORTIC VALVE LVOT Vmax:   92.00 cm/s LVOT Vmean:  49.100 cm/s LVOT VTI:    0.098 m  AORTA Ao Root diam: 3.00 cm MITRAL VALVE MV Area (PHT): 4.17 cm     SHUNTS MV Decel Time: 182 msec     Systemic VTI:  0.10 m MV E velocity: 66.40 cm/s   Systemic Diam: 1.90 cm MV A velocity: 101.00 cm/s MV E/A ratio:  0.66 Lyman Bishop MD Electronically signed by Lyman Bishop MD Signature Date/Time: 12/29/2020/10:58:18 AM    Final      Subjective: No acute issues or events overnight denies nausea vomiting diarrhea constipation fevers chills or chest pain   Discharge Exam: Vitals:   12/30/20 0116 12/30/20 0501  BP: 128/80 132/75  Pulse: 74 75  Resp:  17  Temp: 98.5 F (36.9 C) 98.5 F (36.9 C)  SpO2: 97% 95%   Vitals:   12/29/20 2031 12/29/20 2140 12/30/20 0116 12/30/20 0501  BP:  136/88 128/80 132/75  Pulse: 97 86 74 75  Resp: 18 17  17   Temp:  98.6 F (37 C) 98.5 F (36.9 C) 98.5 F (36.9 C)  TempSrc:  Oral Oral Oral  SpO2: 92% 100% 97% 95%  Weight:      Height:        General: Pt is alert, awake, not in acute distress Cardiovascular: RRR, S1/S2 +, no rubs, no gallops Respiratory: CTA bilaterally, no wheezing, no  rhonchi Abdominal: Soft, NT, ND, bowel sounds + Extremities: no edema, no cyanosis    The results of significant diagnostics from this hospitalization (including imaging, microbiology, ancillary and laboratory) are listed below for reference.     Microbiology: Recent Results (from the past 240 hour(s))  Resp Panel by RT-PCR (Flu A&B, Covid) Nasopharyngeal Swab     Status: None   Collection Time: 12/28/20  9:00 PM   Specimen: Nasopharyngeal Swab; Nasopharyngeal(NP) swabs in vial transport medium  Result Value Ref Range Status   SARS Coronavirus 2 by RT PCR NEGATIVE NEGATIVE Final    Comment: (NOTE) SARS-CoV-2 target nucleic acids are NOT DETECTED.  The SARS-CoV-2 RNA is generally detectable in upper respiratory specimens during the acute phase of infection. The lowest concentration of SARS-CoV-2 viral copies this assay can detect is 138 copies/mL. A negative result does not preclude SARS-Cov-2 infection and should not be used as the sole basis for treatment or other patient management decisions. A negative result may occur with  improper specimen collection/handling, submission of specimen other than nasopharyngeal swab, presence of viral mutation(s) within the areas targeted by this assay, and inadequate number of viral copies(<138 copies/mL). A negative result must be combined with clinical observations, patient history, and epidemiological information. The expected result is Negative.  Fact Sheet for Patients:  EntrepreneurPulse.com.au  Fact Sheet for Healthcare Providers:  IncredibleEmployment.be  This test is no t yet approved or cleared by the [place] FDA and  has been authorized for detection and/or diagnosis of SARS-CoV-2 by FDA under an Emergency Use Authorization (EUA). This EUA will remain  in effect (meaning this test can be used) for the duration of the COVID-19 declaration under Section 564(b)(1) of the Act,  21 U.S.C.section 360bbb-3(b)(1), unless the authorization is terminated  or revoked sooner.       Influenza A by PCR NEGATIVE NEGATIVE Final   Influenza B by PCR NEGATIVE NEGATIVE Final    Comment: (NOTE) The Xpert Xpress SARS-CoV-2/FLU/RSV plus assay is intended as an aid in the diagnosis of influenza from Nasopharyngeal swab specimens and should not be used as a sole basis for treatment. Nasal washings and aspirates are unacceptable for Xpert Xpress SARS-CoV-2/FLU/RSV testing.  Fact Sheet for Patients: EntrepreneurPulse.com.au  Fact Sheet for Healthcare Providers: IncredibleEmployment.be  This test is not yet approved or cleared by the [place] FDA and has been authorized for detection and/or diagnosis of SARS-CoV-2 by FDA under an Emergency Use Authorization (EUA). This EUA will remain in effect (meaning this test can be used) for the duration of the COVID-19 declaration under Section 564(b)(1) of the Act, 21 U.S.C. section 360bbb-3(b)(1), unless the authorization is terminated or revoked.  Performed at [facility], [place] [address] [place], [place] [address]      Labs: BNP (last 3 results) No results for input(s): BNP in the last 8760 hours. Basic Metabolic Panel: Recent Labs  Lab 12/28/20 1025 12/29/20 0500  NA 141 142  K 4.0 4.3  CL 112* 114*  CO2 22 23  GLUCOSE 291* 132*  BUN 37* 37*  CREATININE 4.77* 4.65*  CALCIUM 8.9 9.2   Liver Function Tests: Recent Labs  Lab 12/28/20 1025  AST 16  ALT 17  ALKPHOS 73  BILITOT 0.6  PROT 6.6  ALBUMIN 3.7   No results for input(s): LIPASE, AMYLASE in the last 168 hours. No results for input(s): AMMONIA in the last 168 hours. CBC: Recent Labs  Lab 12/28/20 1025 12/29/20 0500 12/30/20 0457  WBC 6.8 8.0 6.6  NEUTROABS 5.0  --   --   HGB 10.7* 10.2* 10.3*  HCT 35.4* 32.9* 33.9*  MCV 97.8 97.9 99.4  PLT 162 161 139*   Cardiac Enzymes: No  results for input(s): CKTOTAL, CKMB, CKMBINDEX, TROPONINI in the last 168 hours. BNP: Invalid input(s): POCBNP CBG: Recent Labs  Lab 12/29/20 1227 12/29/20 1728 12/29/20 2138 12/30/20 0807 12/30/20 1209  GLUCAP 164* 91 80 75 135*   D-Dimer Recent Labs    12/28/20 1025  DDIMER 0.92*   Hgb A1c Recent Labs    12/29/20 0452  HGBA1C 6.6*   Lipid Profile No results for input(s): CHOL, HDL, LDLCALC, TRIG, CHOLHDL, LDLDIRECT in the last 72 hours. Thyroid function studies No results for input(s): TSH, T4TOTAL, T3FREE, THYROIDAB in the last 72 hours.  Invalid input(s): FREET3 Anemia work up No results for input(s): VITAMINB12, FOLATE, FERRITIN, TIBC, IRON, RETICCTPCT in the last 72 hours. Urinalysis    Component Value Date/Time   COLORURINE YELLOW 01/13/2010 0944  APPEARANCEUR CLEAR 01/13/2010 0944   LABSPEC 1.005 01/13/2010 0944   PHURINE 6.0 01/13/2010 0944   GLUCOSEU NEGATIVE 01/13/2010 0944   HGBUR NEGATIVE 01/13/2010 0944   BILIRUBINUR NEGATIVE 01/13/2010 0944   KETONESUR NEGATIVE 01/13/2010 0944   PROTEINUR NEGATIVE 01/13/2010 0944   UROBILINOGEN 0.2 01/13/2010 0944   NITRITE NEGATIVE 01/13/2010 0944   LEUKOCYTESUR  01/13/2010 0944    NEGATIVE MICROSCOPIC NOT DONE ON URINES WITH NEGATIVE PROTEIN, BLOOD, LEUKOCYTES, NITRITE, OR GLUCOSE <1000 mg/dL.   Sepsis Labs Invalid input(s): PROCALCITONIN,  WBC,  LACTICIDVEN Microbiology Recent Results (from the past 240 hour(s))  Resp Panel by RT-PCR (Flu A&B, Covid) Nasopharyngeal Swab     Status: None   Collection Time: 12/28/20  9:00 PM   Specimen: Nasopharyngeal Swab; Nasopharyngeal(NP) swabs in vial transport medium  Result Value Ref Range Status   SARS Coronavirus 2 by RT PCR NEGATIVE NEGATIVE Final    Comment: (NOTE) SARS-CoV-2 target nucleic acids are NOT DETECTED.  The SARS-CoV-2 RNA is generally detectable in upper respiratory specimens during the acute phase of infection. The lowest concentration of  SARS-CoV-2 viral copies this assay can detect is 138 copies/mL. A negative result does not preclude SARS-Cov-2 infection and should not be used as the sole basis for treatment or other patient management decisions. A negative result may occur with  improper specimen collection/handling, submission of specimen other than nasopharyngeal swab, presence of viral mutation(s) within the areas targeted by this assay, and inadequate number of viral copies(<138 copies/mL). A negative result must be combined with clinical observations, patient history, and epidemiological information. The expected result is Negative.  Fact Sheet for Patients:  EntrepreneurPulse.com.au  Fact Sheet for Healthcare Providers:  IncredibleEmployment.be  This test is no t yet approved or cleared by the [place] FDA and  has been authorized for detection and/or diagnosis of SARS-CoV-2 by FDA under an Emergency Use Authorization (EUA). This EUA will remain  in effect (meaning this test can be used) for the duration of the COVID-19 declaration under Section 564(b)(1) of the Act, 21 U.S.C.section 360bbb-3(b)(1), unless the authorization is terminated  or revoked sooner.       Influenza A by PCR NEGATIVE NEGATIVE Final   Influenza B by PCR NEGATIVE NEGATIVE Final    Comment: (NOTE) The Xpert Xpress SARS-CoV-2/FLU/RSV plus assay is intended as an aid in the diagnosis of influenza from Nasopharyngeal swab specimens and should not be used as a sole basis for treatment. Nasal washings and aspirates are unacceptable for Xpert Xpress SARS-CoV-2/FLU/RSV testing.  Fact Sheet for Patients: EntrepreneurPulse.com.au  Fact Sheet for Healthcare Providers: IncredibleEmployment.be  This test is not yet approved or cleared by the [place] FDA and has been authorized for detection and/or diagnosis of SARS-CoV-2 by FDA under an Emergency Use  Authorization (EUA). This EUA will remain in effect (meaning this test can be used) for the duration of the COVID-19 declaration under Section 564(b)(1) of the Act, 21 U.S.C. section 360bbb-3(b)(1), unless the authorization is terminated or revoked.  Performed at [facility], [place] [address] [place],  [address]      Time coordinating discharge: Over 30 minutes  SIGNED:   [name], DO [facility] 12/30/2020, 12:33 PM Pager   If 7PM-7AM, please contact night-coverage [email or web address]

## 2020-12-31 ENCOUNTER — Other Ambulatory Visit (HOSPITAL_COMMUNITY): Payer: Self-pay

## 2021-04-03 ENCOUNTER — Emergency Department (HOSPITAL_COMMUNITY): Payer: Medicare Other

## 2021-04-03 ENCOUNTER — Other Ambulatory Visit: Payer: Self-pay

## 2021-04-03 ENCOUNTER — Inpatient Hospital Stay (HOSPITAL_COMMUNITY)
Admission: EM | Admit: 2021-04-03 | Discharge: 2021-04-06 | DRG: 190 | Disposition: A | Discharge status: Home / Self Care | Payer: Medicare Other | Attending: Internal Medicine | Admitting: Internal Medicine

## 2021-04-03 DIAGNOSIS — Y92039 Unspecified place in apartment as the place of occurrence of the external cause: Secondary | ICD-10-CM

## 2021-04-03 DIAGNOSIS — I129 Hypertensive chronic kidney disease with stage 1 through stage 4 chronic kidney disease, or unspecified chronic kidney disease: Secondary | ICD-10-CM | POA: Diagnosis not present

## 2021-04-03 DIAGNOSIS — N184 Chronic kidney disease, stage 4 (severe): Secondary | ICD-10-CM | POA: Diagnosis not present

## 2021-04-03 DIAGNOSIS — Z0389 Encounter for observation for other suspected diseases and conditions ruled out: Secondary | ICD-10-CM | POA: Diagnosis not present

## 2021-04-03 DIAGNOSIS — Z681 Body mass index (BMI) 19 or less, adult: Secondary | ICD-10-CM

## 2021-04-03 DIAGNOSIS — Z743 Need for continuous supervision: Secondary | ICD-10-CM | POA: Diagnosis not present

## 2021-04-03 DIAGNOSIS — R636 Underweight: Secondary | ICD-10-CM | POA: Diagnosis not present

## 2021-04-03 DIAGNOSIS — X088XXA Exposure to other specified smoke, fire and flames, initial encounter: Secondary | ICD-10-CM | POA: Diagnosis present

## 2021-04-03 DIAGNOSIS — R062 Wheezing: Secondary | ICD-10-CM | POA: Diagnosis not present

## 2021-04-03 DIAGNOSIS — J9601 Acute respiratory failure with hypoxia: Secondary | ICD-10-CM | POA: Diagnosis not present

## 2021-04-03 DIAGNOSIS — I2699 Other pulmonary embolism without acute cor pulmonale: Secondary | ICD-10-CM | POA: Diagnosis present

## 2021-04-03 DIAGNOSIS — Z794 Long term (current) use of insulin: Secondary | ICD-10-CM

## 2021-04-03 DIAGNOSIS — Z86711 Personal history of pulmonary embolism: Secondary | ICD-10-CM | POA: Diagnosis present

## 2021-04-03 DIAGNOSIS — Z79890 Hormone replacement therapy (postmenopausal): Secondary | ICD-10-CM | POA: Diagnosis not present

## 2021-04-03 DIAGNOSIS — E8729 Other acidosis: Secondary | ICD-10-CM | POA: Diagnosis not present

## 2021-04-03 DIAGNOSIS — T59811A Toxic effect of smoke, accidental (unintentional), initial encounter: Secondary | ICD-10-CM | POA: Diagnosis not present

## 2021-04-03 DIAGNOSIS — F3160 Bipolar disorder, current episode mixed, unspecified: Secondary | ICD-10-CM | POA: Diagnosis not present

## 2021-04-03 DIAGNOSIS — Z7901 Long term (current) use of anticoagulants: Secondary | ICD-10-CM | POA: Diagnosis not present

## 2021-04-03 DIAGNOSIS — J441 Chronic obstructive pulmonary disease with (acute) exacerbation: Secondary | ICD-10-CM | POA: Diagnosis not present

## 2021-04-03 DIAGNOSIS — E1122 Type 2 diabetes mellitus with diabetic chronic kidney disease: Secondary | ICD-10-CM | POA: Diagnosis present

## 2021-04-03 DIAGNOSIS — Z79899 Other long term (current) drug therapy: Secondary | ICD-10-CM | POA: Diagnosis not present

## 2021-04-03 DIAGNOSIS — E785 Hyperlipidemia, unspecified: Secondary | ICD-10-CM | POA: Diagnosis not present

## 2021-04-03 DIAGNOSIS — E1165 Type 2 diabetes mellitus with hyperglycemia: Secondary | ICD-10-CM | POA: Diagnosis not present

## 2021-04-03 DIAGNOSIS — E1129 Type 2 diabetes mellitus with other diabetic kidney complication: Secondary | ICD-10-CM | POA: Diagnosis present

## 2021-04-03 DIAGNOSIS — Z87891 Personal history of nicotine dependence: Secondary | ICD-10-CM | POA: Diagnosis not present

## 2021-04-03 DIAGNOSIS — T59814A Toxic effect of smoke, undetermined, initial encounter: Secondary | ICD-10-CM | POA: Diagnosis not present

## 2021-04-03 DIAGNOSIS — R0902 Hypoxemia: Secondary | ICD-10-CM | POA: Diagnosis not present

## 2021-04-03 DIAGNOSIS — R9431 Abnormal electrocardiogram [ECG] [EKG]: Secondary | ICD-10-CM | POA: Diagnosis not present

## 2021-04-03 DIAGNOSIS — Z20822 Contact with and (suspected) exposure to covid-19: Secondary | ICD-10-CM | POA: Diagnosis not present

## 2021-04-03 DIAGNOSIS — F319 Bipolar disorder, unspecified: Secondary | ICD-10-CM | POA: Diagnosis present

## 2021-04-03 DIAGNOSIS — E039 Hypothyroidism, unspecified: Secondary | ICD-10-CM | POA: Diagnosis present

## 2021-04-03 DIAGNOSIS — J9602 Acute respiratory failure with hypercapnia: Secondary | ICD-10-CM | POA: Diagnosis not present
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
## 2021-04-03 NOTE — ED Notes (Signed)
RT called to collect abg
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
## 2021-04-04 ENCOUNTER — Encounter (HOSPITAL_COMMUNITY): Payer: Self-pay | Admitting: Family Medicine

## 2021-04-04 DIAGNOSIS — J441 Chronic obstructive pulmonary disease with (acute) exacerbation: Secondary | ICD-10-CM | POA: Diagnosis not present

## 2021-04-04 DIAGNOSIS — Z681 Body mass index (BMI) 19 or less, adult: Secondary | ICD-10-CM | POA: Diagnosis not present

## 2021-04-04 DIAGNOSIS — Z20822 Contact with and (suspected) exposure to covid-19: Secondary | ICD-10-CM | POA: Diagnosis present

## 2021-04-04 DIAGNOSIS — T59811A Toxic effect of smoke, accidental (unintentional), initial encounter: Secondary | ICD-10-CM | POA: Diagnosis present

## 2021-04-04 DIAGNOSIS — J9601 Acute respiratory failure with hypoxia: Secondary | ICD-10-CM | POA: Diagnosis not present

## 2021-04-04 DIAGNOSIS — J9602 Acute respiratory failure with hypercapnia: Secondary | ICD-10-CM

## 2021-04-04 DIAGNOSIS — Z79890 Hormone replacement therapy (postmenopausal): Secondary | ICD-10-CM | POA: Diagnosis not present

## 2021-04-04 DIAGNOSIS — F3160 Bipolar disorder, current episode mixed, unspecified: Secondary | ICD-10-CM | POA: Diagnosis not present

## 2021-04-04 DIAGNOSIS — E8729 Other acidosis: Secondary | ICD-10-CM | POA: Diagnosis present

## 2021-04-04 DIAGNOSIS — Y92039 Unspecified place in apartment as the place of occurrence of the external cause: Secondary | ICD-10-CM | POA: Diagnosis not present

## 2021-04-04 DIAGNOSIS — F319 Bipolar disorder, unspecified: Secondary | ICD-10-CM | POA: Diagnosis present

## 2021-04-04 DIAGNOSIS — R9431 Abnormal electrocardiogram [ECG] [EKG]: Secondary | ICD-10-CM | POA: Diagnosis present

## 2021-04-04 DIAGNOSIS — Z87891 Personal history of nicotine dependence: Secondary | ICD-10-CM | POA: Diagnosis not present

## 2021-04-04 DIAGNOSIS — Z79899 Other long term (current) drug therapy: Secondary | ICD-10-CM | POA: Diagnosis not present

## 2021-04-04 DIAGNOSIS — N184 Chronic kidney disease, stage 4 (severe): Secondary | ICD-10-CM | POA: Diagnosis present

## 2021-04-04 DIAGNOSIS — I129 Hypertensive chronic kidney disease with stage 1 through stage 4 chronic kidney disease, or unspecified chronic kidney disease: Secondary | ICD-10-CM | POA: Diagnosis present

## 2021-04-04 DIAGNOSIS — R636 Underweight: Secondary | ICD-10-CM | POA: Diagnosis present

## 2021-04-04 DIAGNOSIS — E1165 Type 2 diabetes mellitus with hyperglycemia: Secondary | ICD-10-CM | POA: Diagnosis present

## 2021-04-04 DIAGNOSIS — E039 Hypothyroidism, unspecified: Secondary | ICD-10-CM | POA: Diagnosis present

## 2021-04-04 DIAGNOSIS — Z7901 Long term (current) use of anticoagulants: Secondary | ICD-10-CM | POA: Diagnosis not present

## 2021-04-04 DIAGNOSIS — X088XXA Exposure to other specified smoke, fire and flames, initial encounter: Secondary | ICD-10-CM | POA: Diagnosis present

## 2021-04-04 DIAGNOSIS — E1122 Type 2 diabetes mellitus with diabetic chronic kidney disease: Secondary | ICD-10-CM | POA: Diagnosis present

## 2021-04-04 DIAGNOSIS — Z794 Long term (current) use of insulin: Secondary | ICD-10-CM | POA: Diagnosis not present

## 2021-04-04 DIAGNOSIS — I2699 Other pulmonary embolism without acute cor pulmonale: Secondary | ICD-10-CM | POA: Diagnosis present

## 2021-04-04 DIAGNOSIS — E785 Hyperlipidemia, unspecified: Secondary | ICD-10-CM | POA: Diagnosis present

## 2021-04-04 LAB — BLOOD GAS, ARTERIAL
Acid-Base Excess: 0 mmol/L (ref 0.0–2.0)
Bicarbonate: 28.9 mmol/L — ABNORMAL HIGH (ref 20.0–28.0)
Drawn by: 33099
O2 Saturation: 100 %
Patient temperature: 37
pCO2 arterial: 66 mmHg (ref 32–48)
pH, Arterial: 7.25 — ABNORMAL LOW (ref 7.35–7.45)
pO2, Arterial: 198 mmHg — ABNORMAL HIGH (ref 83–108)

## 2021-04-04 LAB — BASIC METABOLIC PANEL
Anion gap: 8 (ref 5–15)
BUN: 37 mg/dL — ABNORMAL HIGH (ref 8–23)
CO2: 28 mmol/L (ref 22–32)
Calcium: 8.9 mg/dL (ref 8.9–10.3)
Chloride: 105 mmol/L (ref 98–111)
Creatinine, Ser: 3.97 mg/dL — ABNORMAL HIGH (ref 0.61–1.24)
GFR, Estimated: 16 mL/min — ABNORMAL LOW (ref >60)
Glucose, Bld: 184 mg/dL — ABNORMAL HIGH (ref 70–99)
Potassium: 4.8 mmol/L (ref 3.5–5.1)
Sodium: 141 mmol/L (ref 135–145)

## 2021-04-04 LAB — I-STAT ARTERIAL BLOOD GAS, ED
Acid-Base Excess: 0 mmol/L (ref 0.0–2.0)
Bicarbonate: 28.5 mmol/L — ABNORMAL HIGH (ref 20.0–28.0)
Calcium, Ion: 1.26 mmol/L (ref 1.15–1.40)
HCT: 30 % — ABNORMAL LOW (ref 39.0–52.0)
Hemoglobin: 10.2 g/dL — ABNORMAL LOW (ref 13.0–17.0)
O2 Saturation: 95 %
Patient temperature: 98.6
Potassium: 4.1 mmol/L (ref 3.5–5.1)
Sodium: 140 mmol/L (ref 135–145)
TCO2: 30 mmol/L (ref 22–32)
pCO2 arterial: 65.5 mmHg (ref 32–48)
pH, Arterial: 7.246 — ABNORMAL LOW (ref 7.35–7.45)
pO2, Arterial: 92 mmHg (ref 83–108)

## 2021-04-04 LAB — COMPREHENSIVE METABOLIC PANEL
ALT: 16 U/L (ref 0–44)
AST: 22 U/L (ref 15–41)
Albumin: 3.5 g/dL (ref 3.5–5.0)
Alkaline Phosphatase: 61 U/L (ref 38–126)
Anion gap: 12 (ref 5–15)
BUN: 37 mg/dL — ABNORMAL HIGH (ref 8–23)
CO2: 22 mmol/L (ref 22–32)
Calcium: 8.8 mg/dL — ABNORMAL LOW (ref 8.9–10.3)
Chloride: 107 mmol/L (ref 98–111)
Creatinine, Ser: 3.91 mg/dL — ABNORMAL HIGH (ref 0.61–1.24)
GFR, Estimated: 16 mL/min — ABNORMAL LOW (ref >60)
Glucose, Bld: 140 mg/dL — ABNORMAL HIGH (ref 70–99)
Potassium: 4.7 mmol/L (ref 3.5–5.1)
Sodium: 141 mmol/L (ref 135–145)
Total Bilirubin: 0.8 mg/dL (ref 0.3–1.2)
Total Protein: 6.9 g/dL (ref 6.5–8.1)

## 2021-04-04 LAB — CBC WITH DIFFERENTIAL/PLATELET
Abs Immature Granulocytes: 0.04 10*3/uL (ref 0.00–0.07)
Basophils Absolute: 0.1 10*3/uL (ref 0.0–0.1)
Basophils Relative: 1 %
Eosinophils Absolute: 0.3 10*3/uL (ref 0.0–0.5)
Eosinophils Relative: 4 %
HCT: 37.4 % — ABNORMAL LOW (ref 39.0–52.0)
Hemoglobin: 11 g/dL — ABNORMAL LOW (ref 13.0–17.0)
Immature Granulocytes: 1 %
Lymphocytes Relative: 23 %
Lymphs Abs: 2 10*3/uL (ref 0.7–4.0)
MCH: 30.2 pg (ref 26.0–34.0)
MCHC: 29.4 g/dL — ABNORMAL LOW (ref 30.0–36.0)
MCV: 102.7 fL — ABNORMAL HIGH (ref 80.0–100.0)
Monocytes Absolute: 0.7 10*3/uL (ref 0.1–1.0)
Monocytes Relative: 9 %
Neutro Abs: 5.4 10*3/uL (ref 1.7–7.7)
Neutrophils Relative %: 62 %
Platelets: 210 10*3/uL (ref 150–400)
RBC: 3.64 MIL/uL — ABNORMAL LOW (ref 4.22–5.81)
RDW: 13.8 % (ref 11.5–15.5)
WBC: 8.4 10*3/uL (ref 4.0–10.5)
nRBC: 0 % (ref 0.0–0.2)

## 2021-04-04 LAB — CBC
HCT: 33.8 % — ABNORMAL LOW (ref 39.0–52.0)
Hemoglobin: 10.2 g/dL — ABNORMAL LOW (ref 13.0–17.0)
MCH: 30.6 pg (ref 26.0–34.0)
MCHC: 30.2 g/dL (ref 30.0–36.0)
MCV: 101.5 fL — ABNORMAL HIGH (ref 80.0–100.0)
Platelets: 218 10*3/uL (ref 150–400)
RBC: 3.33 MIL/uL — ABNORMAL LOW (ref 4.22–5.81)
RDW: 13.8 % (ref 11.5–15.5)
WBC: 8.4 10*3/uL (ref 4.0–10.5)
nRBC: 0 % (ref 0.0–0.2)

## 2021-04-04 LAB — HEMOGLOBIN A1C
Hgb A1c MFr Bld: 6.9 % — ABNORMAL HIGH (ref 4.8–5.6)
Mean Plasma Glucose: 151.33 mg/dL

## 2021-04-04 LAB — CBG MONITORING, ED
Glucose-Capillary: 190 mg/dL — ABNORMAL HIGH (ref 70–99)
Glucose-Capillary: 199 mg/dL — ABNORMAL HIGH (ref 70–99)

## 2021-04-04 LAB — COOXEMETRY PANEL
Carboxyhemoglobin: 3.3 % — ABNORMAL HIGH (ref 0.5–1.5)
Methemoglobin: 1 % (ref 0.0–1.5)
O2 Saturation: 99.2 %
Total hemoglobin: 10.9 g/dL — ABNORMAL LOW (ref 12.0–16.0)

## 2021-04-04 LAB — GLUCOSE, CAPILLARY
Glucose-Capillary: 278 mg/dL — ABNORMAL HIGH (ref 70–99)
Glucose-Capillary: 321 mg/dL — ABNORMAL HIGH (ref 70–99)

## 2021-04-04 LAB — RESP PANEL BY RT-PCR (FLU A&B, COVID) ARPGX2
Influenza A by PCR: NEGATIVE
Influenza B by PCR: NEGATIVE
SARS Coronavirus 2 by RT PCR: NEGATIVE

## 2021-04-04 LAB — D-DIMER, QUANTITATIVE: D-Dimer, Quant: 1.91 ug/mL-FEU — ABNORMAL HIGH (ref 0.00–0.50)

## 2021-04-04 MED ORDER — ATORVASTATIN CALCIUM 40 MG PO TABS
40.0000 mg | ORAL_TABLET | Freq: Every day | ORAL | Status: DC
  Administered 2021-04-04 – 2021-04-06 (×3): 40 mg via ORAL
  Filled 2021-04-04 (×3): qty 1

## 2021-04-04 MED ORDER — ACETAMINOPHEN 650 MG RE SUPP: 650.0000 mg | Freq: Four times a day (QID) | RECTAL | Status: DC | PRN

## 2021-04-04 MED ORDER — ADULT MULTIVITAMIN W/MINERALS CH
1.0000 | ORAL_TABLET | Freq: Every day | ORAL | Status: DC
Start: 2021-04-04 — End: 2021-04-06
  Administered 2021-04-04 – 2021-04-06 (×3): 1 via ORAL
  Filled 2021-04-04 (×3): qty 1

## 2021-04-04 MED ORDER — INSULIN GLARGINE-YFGN 100 UNIT/ML ~~LOC~~ SOLN
20.0000 [IU] | Freq: Every day | SUBCUTANEOUS | Status: DC
  Administered 2021-04-04: 20 [IU] via SUBCUTANEOUS
  Filled 2021-04-04 (×3): qty 0.2

## 2021-04-04 MED ORDER — PREDNISONE 20 MG PO TABS
60.0000 mg | ORAL_TABLET | Freq: Once | ORAL | Status: AC
  Administered 2021-04-04: 60 mg via ORAL
  Filled 2021-04-04: qty 3

## 2021-04-04 MED ORDER — ARFORMOTEROL TARTRATE 15 MCG/2ML IN NEBU: 15.0000 ug | INHALATION_SOLUTION | Freq: Two times a day (BID) | RESPIRATORY_TRACT | Status: DC

## 2021-04-04 MED ORDER — ENSURE MAX PROTEIN PO LIQD
11.0000 [oz_av] | Freq: Every day | ORAL | Status: DC
  Administered 2021-04-04 – 2021-04-05 (×2): 11 [oz_av] via ORAL
  Filled 2021-04-04: qty 330

## 2021-04-04 MED ORDER — APIXABAN 5 MG PO TABS
5.0000 mg | ORAL_TABLET | Freq: Two times a day (BID) | ORAL | Status: DC
  Administered 2021-04-04 – 2021-04-05 (×2): 5 mg via ORAL
  Filled 2021-04-04 (×2): qty 1

## 2021-04-04 MED ORDER — INSULIN ASPART 100 UNIT/ML IJ SOLN: 0.0000 [IU] | Freq: Three times a day (TID) | INTRAMUSCULAR | Status: DC | PRN

## 2021-04-04 MED ORDER — UMECLIDINIUM BROMIDE 62.5 MCG/ACT IN AEPB: 1.0000 | INHALATION_SPRAY | Freq: Every day | RESPIRATORY_TRACT | Status: DC

## 2021-04-04 MED ORDER — GLUCERNA SHAKE PO LIQD
237.0000 mL | Freq: Two times a day (BID) | ORAL | Status: DC
  Administered 2021-04-04 – 2021-04-06 (×2): 237 mL via ORAL
  Filled 2021-04-04: qty 237

## 2021-04-04 MED ORDER — UMECLIDINIUM BROMIDE 62.5 MCG/ACT IN AEPB
1.0000 | INHALATION_SPRAY | Freq: Every day | RESPIRATORY_TRACT | Status: DC
  Administered 2021-04-04 – 2021-04-05 (×2): 1 via RESPIRATORY_TRACT
  Filled 2021-04-04: qty 7

## 2021-04-04 MED ORDER — IPRATROPIUM-ALBUTEROL 0.5-2.5 (3) MG/3ML IN SOLN: 3.0000 mL | RESPIRATORY_TRACT | Status: DC | PRN

## 2021-04-04 MED ORDER — AMOXICILLIN-POT CLAVULANATE 875-125 MG PO TABS: 1.0000 | ORAL_TABLET | Freq: Two times a day (BID) | ORAL | Status: DC

## 2021-04-04 MED ORDER — ACETAMINOPHEN 325 MG PO TABS: 650.0000 mg | ORAL_TABLET | Freq: Four times a day (QID) | ORAL | Status: DC | PRN

## 2021-04-04 MED ORDER — LEVOTHYROXINE SODIUM 100 MCG PO TABS
100.0000 ug | ORAL_TABLET | Freq: Every day | ORAL | Status: DC
  Administered 2021-04-04: 100 ug via ORAL
  Filled 2021-04-04: qty 1

## 2021-04-04 MED ORDER — INSULIN ASPART 100 UNIT/ML IJ SOLN
0.0000 [IU] | Freq: Three times a day (TID) | INTRAMUSCULAR | Status: DC
  Administered 2021-04-05: 2 [IU] via SUBCUTANEOUS

## 2021-04-04 MED ORDER — PREDNISONE 20 MG PO TABS
60.0000 mg | ORAL_TABLET | Freq: Every day | ORAL | Status: AC
  Administered 2021-04-04 – 2021-04-06 (×3): 60 mg via ORAL
  Filled 2021-04-04 (×3): qty 3

## 2021-04-04 MED ORDER — LEVOTHYROXINE SODIUM 100 MCG PO TABS
100.0000 ug | ORAL_TABLET | Freq: Every day | ORAL | Status: DC
  Administered 2021-04-04 – 2021-04-05 (×2): 100 ug via ORAL
  Filled 2021-04-04 (×2): qty 1

## 2021-04-04 MED ORDER — INSULIN ASPART 100 UNIT/ML IJ SOLN
0.0000 [IU] | Freq: Once | INTRAMUSCULAR | Status: AC
  Administered 2021-04-04: 3 [IU] via SUBCUTANEOUS

## 2021-04-04 MED ORDER — ALBUTEROL SULFATE (2.5 MG/3ML) 0.083% IN NEBU
2.5000 mg | INHALATION_SOLUTION | RESPIRATORY_TRACT | Status: DC
  Administered 2021-04-04: 2.5 mg via RESPIRATORY_TRACT
  Filled 2021-04-04: qty 3

## 2021-04-04 MED ORDER — LACTATED RINGERS IV SOLN: INTRAVENOUS | Status: DC

## 2021-04-04 MED ORDER — IPRATROPIUM-ALBUTEROL 0.5-2.5 (3) MG/3ML IN SOLN
3.0000 mL | RESPIRATORY_TRACT | Status: AC
  Administered 2021-04-04 (×3): 3 mL via RESPIRATORY_TRACT
  Filled 2021-04-04: qty 9

## 2021-04-04 MED ORDER — AMOXICILLIN-POT CLAVULANATE 500-125 MG PO TABS
1.0000 | ORAL_TABLET | Freq: Two times a day (BID) | ORAL | Status: DC
  Administered 2021-04-04 – 2021-04-06 (×5): 500 mg via ORAL
  Filled 2021-04-04 (×6): qty 1

## 2021-04-04 MED ORDER — ARFORMOTEROL TARTRATE 15 MCG/2ML IN NEBU
15.0000 ug | INHALATION_SOLUTION | Freq: Two times a day (BID) | RESPIRATORY_TRACT | Status: DC
  Administered 2021-04-04: 15 ug via RESPIRATORY_TRACT
  Filled 2021-04-04 (×4): qty 2

## 2021-04-04 NOTE — Assessment & Plan Note (Signed)
Stable. Monitor renal function and electrolytes

## 2021-04-04 NOTE — Assessment & Plan Note (Addendum)
Weaned off of BiPAP by following day.  Still somewhat short of breath and noted wheezing on exam.  By 2/27, breathing more comfortably, on room air.

## 2021-04-04 NOTE — Progress Notes (Signed)
ABG collected, sent to main lab.

## 2021-04-04 NOTE — Assessment & Plan Note (Addendum)
Improved.  Able to be weaned off of BiPAP.  Finished prednisone.  No longer hypoxic.

## 2021-04-04 NOTE — ED Notes (Signed)
Meal tray given 

## 2021-04-04 NOTE — Progress Notes (Signed)
Pt trialed off BIPAP at this time, and placed on 4L [place]. Pt doing well, carrying on conversation with no dyspnea, no increased WOB. RT will continue to monitor and wean O2 as tolerated.

## 2021-04-04 NOTE — ED Notes (Signed)
Pt transitioned to 2L [place] to assess O2 sats - pt O2 dropped into 80s - Pt titrated up on O2 and sats remained in the 80s - Pt placed back on NRB at this time and MD made aware
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
## 2021-04-04 NOTE — ED Notes (Signed)
Lab discontinued Carboxyhemoglobin level. This RN called lab to see if they needed a redraw and lab stated order was discontinued d/t abg collected - This RN informed lab that both were ordered and need to be ran. Lab to run Carboxyhemoglobin at this time. Order placed again. MD notifed.

## 2021-04-04 NOTE — Progress Notes (Signed)
Pt placed on BIPAP per order. Attempted PCV with set RR, but pt breath stacking despite adjustments to settings. Pt currently on NIV/PSV 12/5 achieving volumes anywhere from 475mL to 78mL, and breathing about 16bpm. Pt appearing comfortable on BIPAP, and was asleep when I left the room. RT will continue to monitor.
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
## 2021-04-04 NOTE — Assessment & Plan Note (Addendum)
Nutrition Status: Nutrition Problem: Increased nutrient needs Etiology: chronic illness (COPD) Signs/Symptoms: estimated needs Interventions: Glucerna shake, MVI, Premier Protein

## 2021-04-04 NOTE — Assessment & Plan Note (Signed)
Avoid medications that could further prolong QT interval
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
## 2021-04-04 NOTE — Assessment & Plan Note (Addendum)
Continue basal insulin. Monitor blood sugar and provide corrective insulin as needed.  Some elevated blood sugars in the context of steroids.  We will wean down prednisone. A1c notes good control at 6.9
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
## 2021-04-04 NOTE — ED Notes (Signed)
RT at bedside.

## 2021-04-04 NOTE — ED Notes (Signed)
Dr. Chotiner at bedside  

## 2021-04-04 NOTE — ED Notes (Signed)
MD notified of critical pCO2 level
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
## 2021-04-04 NOTE — Assessment & Plan Note (Signed)
Had PE in Nov. 2022.  Is on Eliquis. Continue anticoagulation

## 2021-04-04 NOTE — Progress Notes (Signed)
BIPAP settings adjusted. Increased IPAP for larger deltaP, and weaned FiO2. Pt achieving volumes of anywhere from 639mL to 1L and still maintains RR of around 16-17bpm. Pt does not appear to be altered, and does not seem to be in any distress. RT will continue to make adjustments to BIPAP as needed.

## 2021-04-04 NOTE — ED Notes (Signed)
Meal tray ordered 

## 2021-04-05 DIAGNOSIS — F3160 Bipolar disorder, current episode mixed, unspecified: Secondary | ICD-10-CM

## 2021-04-05 DIAGNOSIS — N184 Chronic kidney disease, stage 4 (severe): Secondary | ICD-10-CM

## 2021-04-05 LAB — BASIC METABOLIC PANEL
Anion gap: 9 (ref 5–15)
BUN: 50 mg/dL — ABNORMAL HIGH (ref 8–23)
CO2: 27 mmol/L (ref 22–32)
Calcium: 8.6 mg/dL — ABNORMAL LOW (ref 8.9–10.3)
Chloride: 98 mmol/L (ref 98–111)
Creatinine, Ser: 4.01 mg/dL — ABNORMAL HIGH (ref 0.61–1.24)
GFR, Estimated: 15 mL/min — ABNORMAL LOW (ref >60)
Glucose, Bld: 216 mg/dL — ABNORMAL HIGH (ref 70–99)
Potassium: 4.8 mmol/L (ref 3.5–5.1)
Sodium: 134 mmol/L — ABNORMAL LOW (ref 135–145)

## 2021-04-05 LAB — CBC
HCT: 29.7 % — ABNORMAL LOW (ref 39.0–52.0)
Hemoglobin: 9.4 g/dL — ABNORMAL LOW (ref 13.0–17.0)
MCH: 30.8 pg (ref 26.0–34.0)
MCHC: 31.6 g/dL (ref 30.0–36.0)
MCV: 97.4 fL (ref 80.0–100.0)
Platelets: 203 10*3/uL (ref 150–400)
RBC: 3.05 MIL/uL — ABNORMAL LOW (ref 4.22–5.81)
RDW: 13.5 % (ref 11.5–15.5)
WBC: 8.1 10*3/uL (ref 4.0–10.5)
nRBC: 0 % (ref 0.0–0.2)

## 2021-04-05 LAB — GLUCOSE, CAPILLARY
Glucose-Capillary: 202 mg/dL — ABNORMAL HIGH (ref 70–99)
Glucose-Capillary: 124 mg/dL — ABNORMAL HIGH (ref 70–99)

## 2021-04-05 MED ORDER — APIXABAN 2.5 MG PO TABS
2.5000 mg | ORAL_TABLET | Freq: Two times a day (BID) | ORAL | Status: DC
  Administered 2021-04-05 – 2021-04-06 (×2): 2.5 mg via ORAL
  Filled 2021-04-05 (×2): qty 1

## 2021-04-05 NOTE — Progress Notes (Signed)
Patient refused nebulizer treatments as well as CPAP. PT states that he does not trust doctors and what they are prescribing for him.

## 2021-04-05 NOTE — Plan of Care (Signed)

## 2021-04-05 NOTE — Assessment & Plan Note (Signed)
Continue home medications.  Patient does exhibit some mild paranoia symptoms but is appropriate

## 2021-04-05 NOTE — Progress Notes (Signed)
Pt refused BIPAP for tonight. RT will continue to monitor.
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
## 2021-04-05 NOTE — Progress Notes (Signed)
In arrival to room pt immediately began to scream at RN. Stating, "you fat b---, you obviously can't take care of me, you are why people are dying" Continues in the theme screaming and cursing. Convinced pt to take his PM medications and sandwich bag he requested and left room.  When tech attempted to get pt blood sugar, he went off on her calling her a n--- and b---. Pt stated "I hate n--- and don't want them in my room. He stated, "I will tolerate your fat a---" However still did not allow for CBG. After continuing to scream about [place], black people, and gay people he stated, "just get the f--- out" He stated he wanted no more n--- in his room. Was given fresh ice water, sign placed on door to see nurse before entering, and bed alarm on.

## 2021-04-06 ENCOUNTER — Other Ambulatory Visit (HOSPITAL_COMMUNITY): Payer: Self-pay

## 2021-04-06 DIAGNOSIS — R636 Underweight: Secondary | ICD-10-CM

## 2021-04-06 DIAGNOSIS — T59811A Toxic effect of smoke, accidental (unintentional), initial encounter: Secondary | ICD-10-CM

## 2021-04-06 LAB — GLUCOSE, CAPILLARY: Glucose-Capillary: 87 mg/dL (ref 70–99)

## 2021-04-06 MED ORDER — LEVOTHYROXINE SODIUM 100 MCG PO TABS
100.0000 ug | ORAL_TABLET | Freq: Every day | ORAL | 2 refills | Status: DC
  Filled 2021-04-06: qty 30, 30d supply, fill #0

## 2021-04-06 NOTE — Plan of Care (Addendum)
Patient understands discharge instructions. Patient has the mastercard provided by [employer]. Patient has no other questions.  [employer] called for transportation with patient paying.  Problem: Education: Goal: Knowledge of disease or condition will improve Outcome: Progressing   Problem: Activity: Goal: Ability to tolerate increased activity will improve Outcome: Progressing

## 2021-04-06 NOTE — Discharge Instructions (Signed)
Information on my medicine - ELIQUIS (apixaban)  Why was Eliquis prescribed for you? Eliquis was prescribed to treat blood clots were found in the veins in your lungs (pulmonary embolism) in November 2022, and to reduce the risk of them occurring again.  What do You need to know about Eliquis ? Your current dose is ONE 5 mg tablet taken TWICE daily.  Eliquis may be taken with or without food.   Try to take the dose about the same time in the morning and in the evening. If you have difficulty swallowing the tablet whole please discuss with your pharmacist how to take the medication safely.  Take Eliquis exactly as prescribed and DO NOT stop taking Eliquis without talking to the doctor who prescribed the medication.  Stopping may increase your risk of developing a new blood clot.  Refill your prescription before you run out.  After discharge, you should have regular check-up appointments with your healthcare provider that is prescribing your Eliquis.    What do you do if you miss a dose? If a dose of ELIQUIS is not taken at the scheduled time, take it as soon as possible on the same day and twice-daily administration should be resumed. The dose should not be doubled to make up for a missed dose.  Important Safety Information A possible side effect of Eliquis is bleeding. You should call your healthcare provider right away if you experience any of the following: Bleeding from an injury or your nose that does not stop. Unusual colored urine (red or dark brown) or unusual colored stools (red or black). Unusual bruising for unknown reasons. A serious fall or if you hit your head (even if there is no bleeding).  Some medicines may interact with Eliquis and might increase your risk of bleeding or clotting while on Eliquis. To help avoid this, consult your healthcare provider or pharmacist prior to using any new prescription or non-prescription medications, including herbals, vitamins,  non-steroidal anti-inflammatory drugs (NSAIDs) and supplements.  This website has more information on Eliquis (apixaban): http://www.eliquis.com/eliquis/home

## 2021-04-06 NOTE — TOC Benefit Eligibility Note (Signed)
Patient Teacher, English as a foreign language completed.    The patient is currently admitted and upon discharge could be taking Eliquis 5 mg.  The current 30 day co-pay is, $47.00.   The patient is currently admitted and upon discharge could be taking Xarelto 20 mg.  The current 30 day co-pay is, $47.00.   The patient is insured through [place], [place] Patient Advocate Specialist [place] Patient Advocate Team Direct Number: [phone]  Fax: [phone]
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
## 2021-04-14 ENCOUNTER — Other Ambulatory Visit (HOSPITAL_COMMUNITY): Payer: Self-pay

## 2021-04-20 ENCOUNTER — Other Ambulatory Visit (HOSPITAL_COMMUNITY): Payer: Self-pay

## 2021-06-17 DIAGNOSIS — E039 Hypothyroidism, unspecified: Secondary | ICD-10-CM | POA: Diagnosis not present

## 2021-06-17 DIAGNOSIS — E1122 Type 2 diabetes mellitus with diabetic chronic kidney disease: Secondary | ICD-10-CM | POA: Diagnosis not present

## 2021-06-17 DIAGNOSIS — Z862 Personal history of diseases of the blood and blood-forming organs and certain disorders involving the immune mechanism: Secondary | ICD-10-CM | POA: Diagnosis not present

## 2021-06-17 DIAGNOSIS — J449 Chronic obstructive pulmonary disease, unspecified: Secondary | ICD-10-CM | POA: Diagnosis not present

## 2021-06-17 DIAGNOSIS — E785 Hyperlipidemia, unspecified: Secondary | ICD-10-CM | POA: Diagnosis not present

## 2021-06-17 DIAGNOSIS — Z794 Long term (current) use of insulin: Secondary | ICD-10-CM | POA: Diagnosis not present

## 2021-06-17 DIAGNOSIS — Z23 Encounter for immunization: Secondary | ICD-10-CM | POA: Diagnosis not present

## 2021-06-17 DIAGNOSIS — N185 Chronic kidney disease, stage 5: Secondary | ICD-10-CM | POA: Diagnosis not present

## 2021-06-22 DIAGNOSIS — Z7189 Other specified counseling: Secondary | ICD-10-CM | POA: Diagnosis not present

## 2021-10-01 ENCOUNTER — Ambulatory Visit: Payer: Self-pay

## 2021-10-01 NOTE — Patient Outreach (Signed)
  Care Coordination   10/01/2021 Name: Bradley Tran MRN: [id number] DOB: [birth year]   Care Coordination Outreach Attempts:  An unsuccessful telephone outreach was attempted today to offer the patient information about available care coordination services as a benefit of their health plan.   Follow Up Plan:  Additional outreach attempts will be made to offer the patient care coordination information and services.   Encounter Outcome:  No Answer  Care Coordination Interventions Activated:  No   Care Coordination Interventions:  No, not indicated    [place] [facility] [phone]

## 2021-11-20 ENCOUNTER — Telehealth: Payer: Self-pay

## 2021-11-20 NOTE — Patient Outreach (Signed)
  Care Coordination   11/20/2021 Name: Bradley Tran MRN: [id number] DOB: 1950/11/21   Care Coordination Outreach Attempts:  A second unsuccessful outreach was attempted today to offer the patient with information about available care coordination services as a benefit of their health plan.     Follow Up Plan:  Additional outreach attempts will be made to offer the patient care coordination information and services.   Encounter Outcome:  No Answer  Care Coordination Interventions Activated:  No   Care Coordination Interventions:  No, not indicated    [place] [facility] [phone]

## 2021-12-06 ENCOUNTER — Emergency Department (HOSPITAL_COMMUNITY): Payer: Medicare Other

## 2021-12-06 ENCOUNTER — Emergency Department (HOSPITAL_COMMUNITY)
Admission: EM | Admit: 2021-12-06 | Discharge: 2021-12-06 | Discharge status: Against Medical Advice | Payer: Medicare Other | Attending: Emergency Medicine | Admitting: Emergency Medicine

## 2021-12-06 ENCOUNTER — Encounter (HOSPITAL_COMMUNITY): Payer: Self-pay

## 2021-12-06 DIAGNOSIS — R0602 Shortness of breath: Secondary | ICD-10-CM | POA: Insufficient documentation

## 2021-12-06 DIAGNOSIS — Z5321 Procedure and treatment not carried out due to patient leaving prior to being seen by health care provider: Secondary | ICD-10-CM | POA: Insufficient documentation

## 2021-12-06 DIAGNOSIS — Z760 Encounter for issue of repeat prescription: Secondary | ICD-10-CM | POA: Diagnosis not present

## 2021-12-06 MED ORDER — ALBUTEROL SULFATE HFA 108 (90 BASE) MCG/ACT IN AERS
2.0000 | INHALATION_SPRAY | RESPIRATORY_TRACT | Status: DC | PRN
  Administered 2021-12-06: 2 via RESPIRATORY_TRACT
  Filled 2021-12-06: qty 6.7

## 2021-12-06 NOTE — ED Triage Notes (Signed)
Patient arrived stating he needs a refill of his inhaler. Patient reports shortness of breath over the last hour.

## 2021-12-16 DIAGNOSIS — R0902 Hypoxemia: Secondary | ICD-10-CM | POA: Diagnosis not present

## 2022-01-11 ENCOUNTER — Other Ambulatory Visit: Payer: Self-pay

## 2022-01-11 ENCOUNTER — Emergency Department (HOSPITAL_COMMUNITY): Payer: Medicare Other

## 2022-01-11 ENCOUNTER — Encounter (HOSPITAL_COMMUNITY): Payer: Self-pay

## 2022-01-11 ENCOUNTER — Emergency Department (HOSPITAL_COMMUNITY)
Admission: EM | Admit: 2022-01-11 | Discharge: 2022-01-12 | Discharge status: Against Medical Advice | Payer: Medicare Other | Attending: Physician Assistant | Admitting: Physician Assistant

## 2022-01-11 DIAGNOSIS — Z743 Need for continuous supervision: Secondary | ICD-10-CM | POA: Diagnosis not present

## 2022-01-11 DIAGNOSIS — S0990XA Unspecified injury of head, initial encounter: Secondary | ICD-10-CM | POA: Diagnosis not present

## 2022-01-11 DIAGNOSIS — I6381 Other cerebral infarction due to occlusion or stenosis of small artery: Secondary | ICD-10-CM | POA: Diagnosis not present

## 2022-01-11 DIAGNOSIS — R519 Headache, unspecified: Secondary | ICD-10-CM | POA: Insufficient documentation

## 2022-01-11 DIAGNOSIS — S199XXA Unspecified injury of neck, initial encounter: Secondary | ICD-10-CM | POA: Diagnosis not present

## 2022-01-11 MED ORDER — ALBUTEROL SULFATE HFA 108 (90 BASE) MCG/ACT IN AERS: 2.0000 | INHALATION_SPRAY | Freq: Once | RESPIRATORY_TRACT | Status: DC
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
## 2022-01-14 ENCOUNTER — Emergency Department (HOSPITAL_COMMUNITY)
Admission: EM | Admit: 2022-01-14 | Discharge: 2022-01-14 | Disposition: A | Discharge status: Home / Self Care | Payer: Medicare Other | Attending: Emergency Medicine | Admitting: Emergency Medicine

## 2022-01-14 ENCOUNTER — Other Ambulatory Visit: Payer: Self-pay

## 2022-01-14 DIAGNOSIS — M62838 Other muscle spasm: Secondary | ICD-10-CM | POA: Diagnosis not present

## 2022-01-14 DIAGNOSIS — R252 Cramp and spasm: Secondary | ICD-10-CM | POA: Diagnosis not present

## 2022-01-14 DIAGNOSIS — Z7901 Long term (current) use of anticoagulants: Secondary | ICD-10-CM | POA: Diagnosis not present

## 2022-01-14 DIAGNOSIS — Z743 Need for continuous supervision: Secondary | ICD-10-CM | POA: Diagnosis not present

## 2022-01-14 DIAGNOSIS — R069 Unspecified abnormalities of breathing: Secondary | ICD-10-CM | POA: Diagnosis not present

## 2022-01-14 MED ORDER — DIPHENHYDRAMINE HCL 50 MG/ML IJ SOLN
25.0000 mg | Freq: Once | INTRAMUSCULAR | Status: AC
  Administered 2022-01-14: 25 mg via INTRAMUSCULAR
  Filled 2022-01-14: qty 1

## 2022-01-14 NOTE — ED Notes (Signed)
Patient in triage requesting to "have a drink and my shot.  If I am not going to see a doctor, then I can just leave.  I am going to have to sleep here though until morning because I don't have a way home."

## 2022-01-14 NOTE — ED Provider Notes (Signed)
Bradley Tran Provider Note   CSN: [id number] Arrival date & time: 01/14/22  0430     History  Chief Complaint  Patient presents with   Muscle Pain    Bradley Tran is a 71 y.o. male who complains of muscle spasms after having an allergic reaction to a sandwich today. States this has happened before and is remedied by benadryl. Is requesting benadryl IM. Denies SOB ,chest pain. States he has been waiting for 5 hours. Denies any other complaints.     Home Medications Prior to Admission medications   Medication Sig Start Date End Date Taking? Authorizing Provider  amoxicillin-clavulanate (AUGMENTIN) 875-125 MG tablet Take 1 tablet by mouth 2 (two) times daily. 03/30/21   [provider]  apixaban (ELIQUIS) 5 MG TABS tablet Take 1 tablet (5 mg total) by mouth 2 (two) times daily. Patient not taking: Reported on 04/04/2021 01/06/21   Bradley Ishikawa, MD  atorvastatin (LIPITOR) 80 MG tablet Take 40 mg by mouth daily. Patient not taking: Reported on 04/04/2021 12/09/20   [provider]  calcitRIOL (ROCALTROL) 0.25 MCG capsule Take 1 capsule by mouth daily. Patient not taking: Reported on 04/04/2021 12/09/20   [provider]  Cholecalciferol 25 MCG (1000 UT) tablet Take 1,000 Units by mouth every morning. 12/09/20   [provider]  hydrOXYzine (ATARAX) 10 MG tablet Take 10 mg by mouth 2 (two) times daily.    [provider]  insulin glargine (LANTUS) 100 UNIT/ML injection Inject 20 Units into the skin daily before breakfast.    [provider]  insulin glargine-yfgn (SEMGLEE) 100 UNIT/ML injection Inject 20 Units into the skin daily. Patient not taking: Reported on 04/04/2021 12/09/20   [provider]  levothyroxine (SYNTHROID) 100 MCG tablet Take 1 tablet (100 mcg total) by mouth daily. 04/06/21   Bradley Brod, MD  QUEtiapine (SEROQUEL) 200 MG tablet Take 200 mg by mouth in the morning, at  noon, and at bedtime. 05/05/20   [provider]  Tiotropium Bromide-Olodaterol 2.5-2.5 MCG/ACT AERS Inhale 2 puffs into the lungs daily. Patient not taking: Reported on 04/04/2021 12/09/20   [provider]      Allergies    Budesonide-formoterol fumarate and Trifluoperazine    Review of Systems   Review of Systems  Respiratory:  Negative for shortness of breath.   Cardiovascular:  Negative for leg swelling.    Physical Exam Updated Vital Signs BP 110/73 (BP Location: Left Arm)   Pulse 80   Temp 98.9 F (37.2 C) (Oral)   Resp 18   SpO2 92%  Physical Exam Vitals and nursing note reviewed.  Constitutional:      General: He is not in acute distress.    Appearance: He is well-developed.  HENT:     Head: Normocephalic and atraumatic.  Eyes:     Conjunctiva/sclera: Conjunctivae normal.  Cardiovascular:     Rate and Rhythm: Normal rate and regular rhythm.     Heart sounds: No murmur heard. Pulmonary:     Effort: Pulmonary effort is normal. No respiratory distress.     Breath sounds: Normal breath sounds.  Abdominal:     Palpations: Abdomen is soft.     Tenderness: There is no abdominal tenderness.  Musculoskeletal:        General: No swelling.     Cervical back: Neck supple.  Skin:    General: Skin is warm and dry.     Capillary Refill: Capillary refill takes less  than 2 seconds.  Neurological:     Mental Status: He is alert.  Psychiatric:        Mood and Affect: Mood normal.     ED Results / Procedures / Treatments   Labs (all labs ordered are listed, but only abnormal results are displayed) Labs Reviewed - No data to display  EKG None  Radiology No results found.  Procedures Procedures    Medications Ordered in ED Medications  diphenhydrAMINE (BENADRYL) injection 25 mg (25 mg Intramuscular Given 01/14/22 0944)    ED Course/ Medical Decision Making/ A&P                           Medical Decision Making Patient is a 71 y.o. male  here for muscle spasms, has had them before. Requesting benadryl. Believes it is d/t an allergic reaction-denies SOB, swelling of face. Took place 6 hours ago. Given benadryl w/relief of symptoms, discharged home. No swelling of either extremity.   Risk Prescription drug management.   Final Clinical Impression(s) / ED Diagnoses Final diagnoses:  Muscle spasms of both lower extremities    Rx / DC Orders ED Discharge Orders     None         Diamantina Monks, Si Gaul, PA 01/14/22 1017    Lacretia Leigh, MD 01/14/22 1329

## 2022-01-14 NOTE — ED Triage Notes (Signed)
Patient BIB EMS for evaluation of bilateral leg cramps.  States "benadryl will take care of the spasms."  Pt has had improvement in cramps.

## 2022-01-14 NOTE — Discharge Instructions (Addendum)
Please follow-up with your PCP. Make sure you are hydrating appropriately.

## 2022-01-18 ENCOUNTER — Other Ambulatory Visit: Payer: Self-pay

## 2022-01-18 ENCOUNTER — Emergency Department (HOSPITAL_COMMUNITY)
Admission: EM | Admit: 2022-01-18 | Discharge: 2022-01-18 | Discharge status: Against Medical Advice | Payer: Medicare Other | Attending: Emergency Medicine | Admitting: Emergency Medicine

## 2022-01-18 DIAGNOSIS — Z760 Encounter for issue of repeat prescription: Secondary | ICD-10-CM | POA: Diagnosis not present

## 2022-01-18 DIAGNOSIS — Z5321 Procedure and treatment not carried out due to patient leaving prior to being seen by health care provider: Secondary | ICD-10-CM | POA: Insufficient documentation

## 2022-01-18 DIAGNOSIS — J449 Chronic obstructive pulmonary disease, unspecified: Secondary | ICD-10-CM | POA: Diagnosis not present
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
## 2022-01-18 NOTE — ED Triage Notes (Signed)
Patient coming to ED for evaluation of needing "an inhaler for my COPD."  Oxygen saturations noted to be low.  Reports that is "normal."  Refuses oxygen

## 2022-01-18 NOTE — ED Notes (Signed)
Patient out of room demanding an inhaler.  Reports that "if I don't get an inhaler right now I am leaving."  Patient informed that he would need to be seen by a provider prior to medication administer.  Made aware that he was left in room to be seen as quickly as possible.  Pt began cursing at staff and approaching nursing station.  Patient instructed to wait out in lobby.  Stated "fuck yall.  I'm leaving.  Dayshift if better."

## 2022-01-20 ENCOUNTER — Emergency Department (HOSPITAL_COMMUNITY)
Admission: EM | Admit: 2022-01-20 | Discharge: 2022-01-20 | Disposition: A | Discharge status: Home / Self Care | Payer: Medicare Other | Attending: Emergency Medicine | Admitting: Emergency Medicine

## 2022-01-20 ENCOUNTER — Emergency Department (HOSPITAL_COMMUNITY): Payer: Medicare Other

## 2022-01-20 ENCOUNTER — Encounter (HOSPITAL_COMMUNITY): Payer: Self-pay

## 2022-01-20 DIAGNOSIS — X58XXXA Exposure to other specified factors, initial encounter: Secondary | ICD-10-CM | POA: Diagnosis not present

## 2022-01-20 DIAGNOSIS — S0012XA Contusion of left eyelid and periocular area, initial encounter: Secondary | ICD-10-CM | POA: Diagnosis not present

## 2022-01-20 DIAGNOSIS — S0993XA Unspecified injury of face, initial encounter: Secondary | ICD-10-CM | POA: Diagnosis present

## 2022-01-20 DIAGNOSIS — M25512 Pain in left shoulder: Secondary | ICD-10-CM | POA: Diagnosis not present

## 2022-01-20 DIAGNOSIS — E119 Type 2 diabetes mellitus without complications: Secondary | ICD-10-CM | POA: Diagnosis not present

## 2022-01-20 DIAGNOSIS — S0031XA Abrasion of nose, initial encounter: Secondary | ICD-10-CM | POA: Insufficient documentation

## 2022-01-20 DIAGNOSIS — Z794 Long term (current) use of insulin: Secondary | ICD-10-CM | POA: Diagnosis not present

## 2022-01-20 DIAGNOSIS — S40922A Unspecified superficial injury of left upper arm, initial encounter: Secondary | ICD-10-CM | POA: Diagnosis not present

## 2022-01-20 MED ORDER — ACETAMINOPHEN 325 MG PO TABS
650.0000 mg | ORAL_TABLET | Freq: Once | ORAL | Status: AC
  Administered 2022-01-20: 650 mg via ORAL
  Filled 2022-01-20: qty 2

## 2022-01-20 MED ORDER — METHOCARBAMOL 500 MG PO TABS: 500.0000 mg | ORAL_TABLET | Freq: Two times a day (BID) | ORAL | 0 refills | Status: DC
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
## 2022-01-20 NOTE — Discharge Instructions (Addendum)
You were evaluated in the Emergency Department and after careful evaluation, we did not find any emergent condition requiring admission or further testing in the hospital.  Where the sleeve into the ER evaluated by orthopedics.  Take Tylenol for pain.  You may take the muscle relaxer at night, do not drink or drive on the medication as it can make you sleepy.  You may also [facility] if you prefer that.  Please return to the Emergency Department if you experience any worsening of your condition.  Thank you for allowing [place] to be a part of your care.

## 2022-01-22 DIAGNOSIS — Z0000 Encounter for general adult medical examination without abnormal findings: Secondary | ICD-10-CM | POA: Diagnosis not present

## 2022-01-22 DIAGNOSIS — Z681 Body mass index (BMI) 19 or less, adult: Secondary | ICD-10-CM | POA: Diagnosis not present

## 2022-01-22 DIAGNOSIS — J449 Chronic obstructive pulmonary disease, unspecified: Secondary | ICD-10-CM | POA: Diagnosis not present

## 2022-01-22 DIAGNOSIS — N185 Chronic kidney disease, stage 5: Secondary | ICD-10-CM | POA: Diagnosis not present

## 2022-03-26 DIAGNOSIS — J069 Acute upper respiratory infection, unspecified: Secondary | ICD-10-CM | POA: Diagnosis not present

## 2022-03-26 DIAGNOSIS — U071 COVID-19: Secondary | ICD-10-CM | POA: Diagnosis not present

## 2022-04-02 ENCOUNTER — Emergency Department (HOSPITAL_COMMUNITY): Payer: Medicare Other

## 2022-04-02 ENCOUNTER — Encounter (HOSPITAL_COMMUNITY): Payer: Self-pay

## 2022-04-02 ENCOUNTER — Emergency Department (HOSPITAL_COMMUNITY)
Admission: EM | Admit: 2022-04-02 | Discharge: 2022-04-02 | Disposition: A | Discharge status: Home / Self Care | Payer: Medicare Other | Source: Home / Self Care | Attending: Emergency Medicine | Admitting: Emergency Medicine

## 2022-04-02 ENCOUNTER — Inpatient Hospital Stay (HOSPITAL_COMMUNITY)
Admission: EM | Admit: 2022-04-02 | Discharge: 2022-05-10 | DRG: 208 | Disposition: E | Payer: Medicare Other | Attending: Pulmonary Disease | Admitting: Pulmonary Disease

## 2022-04-02 ENCOUNTER — Other Ambulatory Visit: Payer: Self-pay

## 2022-04-02 DIAGNOSIS — Z7901 Long term (current) use of anticoagulants: Secondary | ICD-10-CM | POA: Insufficient documentation

## 2022-04-02 DIAGNOSIS — F319 Bipolar disorder, unspecified: Secondary | ICD-10-CM | POA: Diagnosis present

## 2022-04-02 DIAGNOSIS — E870 Hyperosmolality and hypernatremia: Secondary | ICD-10-CM | POA: Diagnosis not present

## 2022-04-02 DIAGNOSIS — R609 Edema, unspecified: Secondary | ICD-10-CM | POA: Diagnosis not present

## 2022-04-02 DIAGNOSIS — Z91199 Patient's noncompliance with other medical treatment and regimen due to unspecified reason: Secondary | ICD-10-CM

## 2022-04-02 DIAGNOSIS — N179 Acute kidney failure, unspecified: Secondary | ICD-10-CM | POA: Diagnosis not present

## 2022-04-02 DIAGNOSIS — R7989 Other specified abnormal findings of blood chemistry: Secondary | ICD-10-CM | POA: Diagnosis not present

## 2022-04-02 DIAGNOSIS — D6959 Other secondary thrombocytopenia: Secondary | ICD-10-CM | POA: Diagnosis present

## 2022-04-02 DIAGNOSIS — R451 Restlessness and agitation: Secondary | ICD-10-CM | POA: Diagnosis not present

## 2022-04-02 DIAGNOSIS — R069 Unspecified abnormalities of breathing: Secondary | ICD-10-CM | POA: Insufficient documentation

## 2022-04-02 DIAGNOSIS — K922 Gastrointestinal hemorrhage, unspecified: Secondary | ICD-10-CM | POA: Diagnosis not present

## 2022-04-02 DIAGNOSIS — E1165 Type 2 diabetes mellitus with hyperglycemia: Secondary | ICD-10-CM | POA: Diagnosis not present

## 2022-04-02 DIAGNOSIS — R339 Retention of urine, unspecified: Secondary | ICD-10-CM | POA: Diagnosis not present

## 2022-04-02 DIAGNOSIS — N184 Chronic kidney disease, stage 4 (severe): Secondary | ICD-10-CM | POA: Diagnosis present

## 2022-04-02 DIAGNOSIS — J9600 Acute respiratory failure, unspecified whether with hypoxia or hypercapnia: Secondary | ICD-10-CM

## 2022-04-02 DIAGNOSIS — E1122 Type 2 diabetes mellitus with diabetic chronic kidney disease: Secondary | ICD-10-CM | POA: Diagnosis present

## 2022-04-02 DIAGNOSIS — R059 Cough, unspecified: Secondary | ICD-10-CM

## 2022-04-02 DIAGNOSIS — R0602 Shortness of breath: Secondary | ICD-10-CM | POA: Diagnosis not present

## 2022-04-02 DIAGNOSIS — G9341 Metabolic encephalopathy: Secondary | ICD-10-CM | POA: Diagnosis present

## 2022-04-02 DIAGNOSIS — Z794 Long term (current) use of insulin: Secondary | ICD-10-CM

## 2022-04-02 DIAGNOSIS — E43 Unspecified severe protein-calorie malnutrition: Secondary | ICD-10-CM | POA: Insufficient documentation

## 2022-04-02 DIAGNOSIS — Z87891 Personal history of nicotine dependence: Secondary | ICD-10-CM

## 2022-04-02 DIAGNOSIS — Z8616 Personal history of COVID-19: Secondary | ICD-10-CM | POA: Diagnosis not present

## 2022-04-02 DIAGNOSIS — E785 Hyperlipidemia, unspecified: Secondary | ICD-10-CM | POA: Diagnosis present

## 2022-04-02 DIAGNOSIS — R0600 Dyspnea, unspecified: Secondary | ICD-10-CM | POA: Diagnosis not present

## 2022-04-02 DIAGNOSIS — S2231XA Fracture of one rib, right side, initial encounter for closed fracture: Secondary | ICD-10-CM | POA: Diagnosis not present

## 2022-04-02 DIAGNOSIS — I472 Ventricular tachycardia, unspecified: Secondary | ICD-10-CM | POA: Diagnosis not present

## 2022-04-02 DIAGNOSIS — Z4682 Encounter for fitting and adjustment of non-vascular catheter: Secondary | ICD-10-CM | POA: Diagnosis not present

## 2022-04-02 DIAGNOSIS — E871 Hypo-osmolality and hyponatremia: Secondary | ICD-10-CM | POA: Diagnosis present

## 2022-04-02 DIAGNOSIS — R4781 Slurred speech: Secondary | ICD-10-CM | POA: Diagnosis not present

## 2022-04-02 DIAGNOSIS — U071 COVID-19: Principal | ICD-10-CM

## 2022-04-02 DIAGNOSIS — Z781 Physical restraint status: Secondary | ICD-10-CM

## 2022-04-02 DIAGNOSIS — J90 Pleural effusion, not elsewhere classified: Secondary | ICD-10-CM | POA: Diagnosis not present

## 2022-04-02 DIAGNOSIS — J9602 Acute respiratory failure with hypercapnia: Secondary | ICD-10-CM | POA: Diagnosis not present

## 2022-04-02 DIAGNOSIS — Z79899 Other long term (current) drug therapy: Secondary | ICD-10-CM

## 2022-04-02 DIAGNOSIS — I4510 Unspecified right bundle-branch block: Secondary | ICD-10-CM | POA: Diagnosis not present

## 2022-04-02 DIAGNOSIS — E8729 Other acidosis: Secondary | ICD-10-CM | POA: Diagnosis present

## 2022-04-02 DIAGNOSIS — J439 Emphysema, unspecified: Secondary | ICD-10-CM | POA: Diagnosis present

## 2022-04-02 DIAGNOSIS — Z5900 Homelessness unspecified: Secondary | ICD-10-CM

## 2022-04-02 DIAGNOSIS — F05 Delirium due to known physiological condition: Secondary | ICD-10-CM | POA: Diagnosis not present

## 2022-04-02 DIAGNOSIS — I469 Cardiac arrest, cause unspecified: Secondary | ICD-10-CM | POA: Diagnosis not present

## 2022-04-02 DIAGNOSIS — E1129 Type 2 diabetes mellitus with other diabetic kidney complication: Secondary | ICD-10-CM | POA: Diagnosis present

## 2022-04-02 DIAGNOSIS — D631 Anemia in chronic kidney disease: Secondary | ICD-10-CM | POA: Diagnosis present

## 2022-04-02 DIAGNOSIS — E039 Hypothyroidism, unspecified: Secondary | ICD-10-CM | POA: Diagnosis present

## 2022-04-02 DIAGNOSIS — J441 Chronic obstructive pulmonary disease with (acute) exacerbation: Secondary | ICD-10-CM | POA: Diagnosis not present

## 2022-04-02 DIAGNOSIS — Z66 Do not resuscitate: Secondary | ICD-10-CM | POA: Diagnosis not present

## 2022-04-02 DIAGNOSIS — R6889 Other general symptoms and signs: Secondary | ICD-10-CM | POA: Diagnosis not present

## 2022-04-02 DIAGNOSIS — F3160 Bipolar disorder, current episode mixed, unspecified: Secondary | ICD-10-CM | POA: Diagnosis not present

## 2022-04-02 DIAGNOSIS — Z515 Encounter for palliative care: Secondary | ICD-10-CM | POA: Diagnosis not present

## 2022-04-02 DIAGNOSIS — K219 Gastro-esophageal reflux disease without esophagitis: Secondary | ICD-10-CM | POA: Diagnosis present

## 2022-04-02 DIAGNOSIS — R9431 Abnormal electrocardiogram [ECG] [EKG]: Secondary | ICD-10-CM | POA: Diagnosis not present

## 2022-04-02 DIAGNOSIS — K5900 Constipation, unspecified: Secondary | ICD-10-CM | POA: Diagnosis not present

## 2022-04-02 DIAGNOSIS — Z888 Allergy status to other drugs, medicaments and biological substances status: Secondary | ICD-10-CM

## 2022-04-02 DIAGNOSIS — Z743 Need for continuous supervision: Secondary | ICD-10-CM | POA: Diagnosis not present

## 2022-04-02 DIAGNOSIS — E860 Dehydration: Secondary | ICD-10-CM | POA: Diagnosis present

## 2022-04-02 DIAGNOSIS — D638 Anemia in other chronic diseases classified elsewhere: Secondary | ICD-10-CM | POA: Diagnosis not present

## 2022-04-02 DIAGNOSIS — I959 Hypotension, unspecified: Secondary | ICD-10-CM | POA: Diagnosis not present

## 2022-04-02 DIAGNOSIS — J9601 Acute respiratory failure with hypoxia: Secondary | ICD-10-CM | POA: Diagnosis present

## 2022-04-02 DIAGNOSIS — T45516A Underdosing of anticoagulants, initial encounter: Secondary | ICD-10-CM | POA: Diagnosis present

## 2022-04-02 DIAGNOSIS — Z86711 Personal history of pulmonary embolism: Secondary | ICD-10-CM

## 2022-04-02 DIAGNOSIS — J9690 Respiratory failure, unspecified, unspecified whether with hypoxia or hypercapnia: Secondary | ICD-10-CM | POA: Diagnosis not present

## 2022-04-02 DIAGNOSIS — Z79890 Hormone replacement therapy (postmenopausal): Secondary | ICD-10-CM

## 2022-04-02 DIAGNOSIS — I129 Hypertensive chronic kidney disease with stage 1 through stage 4 chronic kidney disease, or unspecified chronic kidney disease: Secondary | ICD-10-CM | POA: Diagnosis not present

## 2022-04-02 DIAGNOSIS — I462 Cardiac arrest due to underlying cardiac condition: Secondary | ICD-10-CM | POA: Diagnosis not present

## 2022-04-02 DIAGNOSIS — Z91148 Patient's other noncompliance with medication regimen for other reason: Secondary | ICD-10-CM

## 2022-04-02 DIAGNOSIS — Z681 Body mass index (BMI) 19 or less, adult: Secondary | ICD-10-CM

## 2022-04-02 DIAGNOSIS — F411 Generalized anxiety disorder: Secondary | ICD-10-CM | POA: Diagnosis present

## 2022-04-02 DIAGNOSIS — R0902 Hypoxemia: Secondary | ICD-10-CM | POA: Diagnosis not present

## 2022-04-02 DIAGNOSIS — E875 Hyperkalemia: Secondary | ICD-10-CM | POA: Diagnosis present

## 2022-04-02 LAB — COMPREHENSIVE METABOLIC PANEL
ALT: 19 U/L (ref 0–44)
AST: 12 U/L — ABNORMAL LOW (ref 15–41)
Albumin: 3.9 g/dL (ref 3.5–5.0)
Alkaline Phosphatase: 79 U/L (ref 38–126)
Anion gap: 13 (ref 5–15)
BUN: 79 mg/dL — ABNORMAL HIGH (ref 8–23)
CO2: 19 mmol/L — ABNORMAL LOW (ref 22–32)
Calcium: 8.4 mg/dL — ABNORMAL LOW (ref 8.9–10.3)
Chloride: 111 mmol/L (ref 98–111)
Creatinine, Ser: 6.57 mg/dL — ABNORMAL HIGH (ref 0.61–1.24)
GFR, Estimated: 9 mL/min — ABNORMAL LOW (ref >60)
Glucose, Bld: 137 mg/dL — ABNORMAL HIGH (ref 70–99)
Potassium: 3.8 mmol/L (ref 3.5–5.1)
Sodium: 143 mmol/L (ref 135–145)
Total Bilirubin: 0.5 mg/dL (ref 0.3–1.2)
Total Protein: 7.6 g/dL (ref 6.5–8.1)

## 2022-04-02 LAB — CBC WITH DIFFERENTIAL/PLATELET
Abs Immature Granulocytes: 0.04 10*3/uL (ref 0.00–0.07)
Basophils Absolute: 0 10*3/uL (ref 0.0–0.1)
Basophils Relative: 1 %
Eosinophils Absolute: 0.5 10*3/uL (ref 0.0–0.5)
Eosinophils Relative: 7 %
HCT: 29.7 % — ABNORMAL LOW (ref 39.0–52.0)
Hemoglobin: 9 g/dL — ABNORMAL LOW (ref 13.0–17.0)
Immature Granulocytes: 1 %
Lymphocytes Relative: 20 %
Lymphs Abs: 1.5 10*3/uL (ref 0.7–4.0)
MCH: 30.9 pg (ref 26.0–34.0)
MCHC: 30.3 g/dL (ref 30.0–36.0)
MCV: 102.1 fL — ABNORMAL HIGH (ref 80.0–100.0)
Monocytes Absolute: 1.3 10*3/uL — ABNORMAL HIGH (ref 0.1–1.0)
Monocytes Relative: 18 %
Neutro Abs: 3.9 10*3/uL (ref 1.7–7.7)
Neutrophils Relative %: 53 %
Platelets: 171 10*3/uL (ref 150–400)
RBC: 2.91 MIL/uL — ABNORMAL LOW (ref 4.22–5.81)
RDW: 14.3 % (ref 11.5–15.5)
WBC: 7.1 10*3/uL (ref 4.0–10.5)
nRBC: 0 % (ref 0.0–0.2)

## 2022-04-02 LAB — ETHANOL: Alcohol, Ethyl (B): <10 mg/dL (ref <10)

## 2022-04-02 LAB — I-STAT CHEM 8, ED
BUN: 90 mg/dL — ABNORMAL HIGH (ref 8–23)
Calcium, Ion: 1.12 mmol/L — ABNORMAL LOW (ref 1.15–1.40)
Chloride: 114 mmol/L — ABNORMAL HIGH (ref 98–111)
Creatinine, Ser: 7.2 mg/dL — ABNORMAL HIGH (ref 0.61–1.24)
Glucose, Bld: 129 mg/dL — ABNORMAL HIGH (ref 70–99)
HCT: 29 % — ABNORMAL LOW (ref 39.0–52.0)
Hemoglobin: 9.9 g/dL — ABNORMAL LOW (ref 13.0–17.0)
Potassium: 3.9 mmol/L (ref 3.5–5.1)
Sodium: 145 mmol/L (ref 135–145)
TCO2: 23 mmol/L (ref 22–32)

## 2022-04-02 LAB — I-STAT VENOUS BLOOD GAS, ED
Acid-base deficit: 8 mmol/L — ABNORMAL HIGH (ref 0.0–2.0)
Bicarbonate: 20.5 mmol/L (ref 20.0–28.0)
Calcium, Ion: 1.12 mmol/L — ABNORMAL LOW (ref 1.15–1.40)
HCT: 28 % — ABNORMAL LOW (ref 39.0–52.0)
Hemoglobin: 9.5 g/dL — ABNORMAL LOW (ref 13.0–17.0)
O2 Saturation: 98 %
Potassium: 3.9 mmol/L (ref 3.5–5.1)
Sodium: 144 mmol/L (ref 135–145)
TCO2: 22 mmol/L (ref 22–32)
pCO2, Ven: 58.6 mmHg (ref 44–60)
pH, Ven: 7.152 — CL (ref 7.25–7.43)
pO2, Ven: 145 mmHg — ABNORMAL HIGH (ref 32–45)

## 2022-04-02 LAB — RESP PANEL BY RT-PCR (RSV, FLU A&B, COVID)  RVPGX2
Influenza A by PCR: NEGATIVE
Influenza B by PCR: NEGATIVE
Resp Syncytial Virus by PCR: NEGATIVE
SARS Coronavirus 2 by RT PCR: POSITIVE — AB

## 2022-04-02 LAB — BRAIN NATRIURETIC PEPTIDE: B Natriuretic Peptide: 158.2 pg/mL — ABNORMAL HIGH (ref 0.0–100.0)

## 2022-04-02 LAB — TROPONIN I (HIGH SENSITIVITY): Troponin I (High Sensitivity): 9 ng/L (ref <18)

## 2022-04-02 MED ORDER — SODIUM CHLORIDE 0.9 % IV SOLN: Freq: Once | INTRAVENOUS | Status: AC

## 2022-04-02 MED ORDER — LORAZEPAM 2 MG/ML IJ SOLN
0.5000 mg | Freq: Once | INTRAMUSCULAR | Status: AC
  Administered 2022-04-02: 0.5 mg via INTRAVENOUS
  Filled 2022-04-02: qty 1

## 2022-04-02 NOTE — ED Notes (Signed)
Called into room by tech. Pt has removed all of his monitoring equipment and his bipap. I asked him to put it back on, he said "no, screw you". I asked him if he wants [place] to take care of him "no." Requested Dr. Francia Greaves to come to the bedside, he spoke with patient. Patient refusing the BIPAP or monitoring equipment back on. Dr. Francia Greaves asked him if he would like some medication for anxiety, pt agreed.On return to room, the patient had pulled out his IV. I asked him why he pulled it out, "why not". He agreed to have a new IV placed. Pt still refusing monitoring equipment. Administered ativan and I was able to place Pulse Ox on the patients toe. Noted saturations to be in the 50's, pt with decreased LOC. Dr. Francia Greaves to bedside. BIPAP replaced with improved saturations. Monitoring equipment replaced.
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
## 2022-04-02 NOTE — ED Provider Notes (Addendum)
[place] Provider Note   CSN: [id number] Arrival date & time: 04/06/2022  2127     History  Chief Complaint  Patient presents with   Shortness of Breath    Bradley Tran is a 72 y.o. male.  72 year old male with medical history as detailed below presents for evaluation.  Patient arrives with EMS transport from outside the [facility].  Patient was complaining of shortness of breath to bystanders.  EMS reports patient was breathing with significant difficulty - initial respiratory rate was in the 60s.  Patient with minimal air movement initially.  Room air sats were 60 to 70% with EMS.  Patient has received 2 DuoNeb treatments, 125 mg of Solu-Medrol, 2 g of magnesium during transport.  Patient is somnolent but arousable.  He is able to answer questions.  He reports that he feels improved with EMS treatment.  Patient is unable to provide a significant recent history.  Of note, patient was briefly seen at [facility] earlier this evening.  He apparently became belligerent and left the facility prior to initiation of workup or treatment.  The history is provided by the patient and medical records.       Home Medications Prior to Admission medications   Medication Sig Start Date End Date Taking? Authorizing Provider  amoxicillin-clavulanate (AUGMENTIN) 875-125 MG tablet Take 1 tablet by mouth 2 (two) times daily. 03/30/21   [provider]  apixaban (ELIQUIS) 5 MG TABS tablet Take 1 tablet (5 mg total) by mouth 2 (two) times daily. Patient not taking: Reported on 04/04/2021 01/06/21   [name], MD  atorvastatin (LIPITOR) 80 MG tablet Take 40 mg by mouth daily. Patient not taking: Reported on 04/04/2021 12/09/20   [provider]  calcitRIOL (ROCALTROL) 0.25 MCG capsule Take 1 capsule by mouth daily. Patient not taking: Reported on 04/04/2021 12/09/20   [provider]  Cholecalciferol 25 MCG (1000 UT) tablet  Take 1,000 Units by mouth every morning. 12/09/20   [provider]  hydrOXYzine (ATARAX) 10 MG tablet Take 10 mg by mouth 2 (two) times daily.    [provider]  insulin glargine (LANTUS) 100 UNIT/ML injection Inject 20 Units into the skin daily before breakfast.    [provider]  insulin glargine-yfgn (SEMGLEE) 100 UNIT/ML injection Inject 20 Units into the skin daily. Patient not taking: Reported on 04/04/2021 12/09/20   [provider]  levothyroxine (SYNTHROID) 100 MCG tablet Take 1 tablet (100 mcg total) by mouth daily. 04/06/21   [name], MD  methocarbamol (ROBAXIN) 500 MG tablet Take 1 tablet (500 mg total) by mouth 2 (two) times daily. 01/20/22   Garald Balding, PA-C  QUEtiapine (SEROQUEL) 200 MG tablet Take 200 mg by mouth in the morning, at noon, and at bedtime. 05/05/20   [provider]  Tiotropium Bromide-Olodaterol 2.5-2.5 MCG/ACT AERS Inhale 2 puffs into the lungs daily. Patient not taking: Reported on 04/04/2021 12/09/20   [provider]      Allergies    Budesonide-formoterol fumarate and Trifluoperazine    Review of Systems   Review of Systems  Unable to perform ROS: Acuity of condition    Physical Exam Updated Vital Signs There were no vitals taken for this visit. Physical Exam Vitals and nursing note reviewed.  Constitutional:      General: He is not in acute distress.    Appearance: Normal appearance.     Comments: Somnolent but arousable.  Answers  questions slowly but correctly.  HENT:     Head: Normocephalic and atraumatic.  Eyes:     Conjunctiva/sclera: Conjunctivae normal.     Pupils: Pupils are equal, round, and reactive to light.  Cardiovascular:     Rate and Rhythm: Normal rate and regular rhythm.     Heart sounds: Normal heart sounds.  Pulmonary:     Effort: Pulmonary effort is normal. No respiratory distress.     Breath sounds: Decreased breath sounds present.     Comments: Patient  with diffuse expiratory wheezes in all lung fields.  Moderately decreased air movement. Abdominal:     General: There is no distension.     Palpations: Abdomen is soft.     Tenderness: There is no abdominal tenderness.  Musculoskeletal:        General: No deformity. Normal range of motion.     Cervical back: Normal range of motion and neck supple.  Skin:    General: Skin is warm and dry.  Neurological:     General: No focal deficit present.     Mental Status: He is oriented to person, place, and time.     ED Results / Procedures / Treatments   Labs (all labs ordered are listed, but only abnormal results are displayed) Labs Reviewed  CBC WITH DIFFERENTIAL/PLATELET - Abnormal; Notable for the following components:      Result Value   RBC 2.91 (*)    Hemoglobin 9.0 (*)    HCT 29.7 (*)    MCV 102.1 (*)    Monocytes Absolute 1.3 (*)    All other components within normal limits  I-STAT VENOUS BLOOD GAS, ED - Abnormal; Notable for the following components:   pH, Ven 7.152 (*)    pO2, Ven 145 (*)    Acid-base deficit 8.0 (*)    Calcium, Ion 1.12 (*)    HCT 28.0 (*)    Hemoglobin 9.5 (*)    All other components within normal limits  I-STAT CHEM 8, ED - Abnormal; Notable for the following components:   Chloride 114 (*)    BUN 90 (*)    Creatinine, Ser 7.20 (*)    Glucose, Bld 129 (*)    Calcium, Ion 1.12 (*)    Hemoglobin 9.9 (*)    HCT 29.0 (*)    All other components within normal limits  RESP PANEL BY RT-PCR (RSV, FLU A&B, COVID)  RVPGX2  RAPID URINE DRUG SCREEN, HOSP PERFORMED  ETHANOL  COMPREHENSIVE METABOLIC PANEL  BRAIN NATRIURETIC PEPTIDE  TROPONIN I (HIGH SENSITIVITY)    EKG None  Radiology No results found.  Procedures Procedures    Medications Ordered in ED Medications - No data to display  ED Course/ Medical Decision Making/ A&P                             Medical Decision Making Amount and/or Complexity of Data Reviewed Labs:  ordered. Radiology: ordered.  Risk Prescription drug management. Decision regarding hospitalization.    Medical Screen Complete  This patient presented to the ED with complaint of SOB.  This complaint involves an extensive number of treatment options. The initial differential diagnosis includes, but is not limited to, COPD exac, pneumonia, etc.  This presentation is: Acute, Chronic, Self-Limited, Previously Undiagnosed, Uncertain Prognosis, Complicated, Systemic Symptoms, and Threat to Life/Bodily Function  Patient presented with complaint of increased shortness of breath.  EMS report significant respiratory distress on their initial  evaluation.  Patient's exam is suggestive of hypercarbia.  BiPAP initiated on arrival to the ED.  Patient is tolerating this well.  Patient would benefit from admission for further workup and treatment.  Critical care made aware of case and will evaluate for admission.  Just prior to admission patient removed his BiPAP.  Patient does not want to leave AMA.  Patient given 0.5 Ativan for agitation.  Patient placed back on BiPAP after sedation given.   Additional history obtained from EMS External records from outside sources obtained and reviewed including prior ED visits and prior Inpatient records.    Lab Tests:  I ordered and personally interpreted labs.  The pertinent results include: CBC, CMP, EtOH, VBG, troponin   Imaging Studies ordered:  I ordered imaging studies including CXR  I independently visualized and interpreted obtained imaging which showed NAD I agree with the radiologist interpretation.   Cardiac Monitoring:  The patient was maintained on a cardiac monitor.  I personally viewed and interpreted the cardiac monitor which showed an underlying rhythm of: NSR   Problem List / ED Course:  Dyspnea, hypercarbic respiratory failure   Reevaluation:  After the interventions noted above, I reevaluated the patient and found  that they have: improved  Disposition:  After consideration of the diagnostic results and the patients response to treatment, I feel that the patent would benefit from admission.  Marland [facility] CRITICAL CARE Performed by: Valarie Merino   Total critical care time: 30 minutes  Critical care time was exclusive of separately billable procedures and treating other patients.  Critical care was necessary to treat or prevent imminent or life-threatening deterioration.  Critical care was time spent personally by me on the following activities: development of treatment plan with patient and/or surrogate as well as nursing, discussions with consultants, evaluation of patient's response to treatment, examination of patient, obtaining history from patient or surrogate, ordering and performing treatments and interventions, ordering and review of laboratory studies, ordering and review of radiographic studies, pulse oximetry and re-evaluation of patient's condition.         Final Clinical Impression(s) / ED Diagnoses Final diagnoses:  COPD exacerbation ([place])  Dyspnea, unspecified type    Rx / DC Orders ED Discharge Orders     None         Valarie Merino, MD 03/11/2022 2239    Valarie Merino, MD 03/19/2022 2340

## 2022-04-02 NOTE — ED Triage Notes (Signed)
Pt arrives via [facility] from [facility]. c/o sob. Breathing 60 RR, diminished breath sounds, faint expiratory wheeze, 60% -70% RA. Pt has rec'd 2 duonebs, 125 solumedrol and 2gm MG. Improved to 98% on nebulizer. Capnography around 50. 150/76, iv established in the left forearm.

## 2022-04-02 NOTE — ED Triage Notes (Signed)
States that a week ago he was diagnosed with COVID. He is homeless, states that he has a productive cough, body aches, not very forthcoming during exam.     80% in room 96-4L- suppose to be on oxygen but refuses to wear it.  [place]  in [place].

## 2022-04-02 NOTE — ED Provider Notes (Signed)
[place] AT [facility] Provider Note   CSN: [id number] Arrival date & time: 03/31/2022  1722     History  Chief Complaint  Patient presents with   Cough    Bradley Tran is a 72 y.o. male.  72 yo M with a cc of cough.  This is reported by the triage nurse.  The patient is unwilling to give me any information.  Level 5 caveat.   Cough      Home Medications Prior to Admission medications   Medication Sig Start Date End Date Taking? Authorizing Provider  amoxicillin-clavulanate (AUGMENTIN) 875-125 MG tablet Take 1 tablet by mouth 2 (two) times daily. 03/30/21   [provider]  apixaban (ELIQUIS) 5 MG TABS tablet Take 1 tablet (5 mg total) by mouth 2 (two) times daily. Patient not taking: Reported on 04/04/2021 01/06/21   [name], MD  atorvastatin (LIPITOR) 80 MG tablet Take 40 mg by mouth daily. Patient not taking: Reported on 04/04/2021 12/09/20   [provider]  calcitRIOL (ROCALTROL) 0.25 MCG capsule Take 1 capsule by mouth daily. Patient not taking: Reported on 04/04/2021 12/09/20   [provider]  Cholecalciferol 25 MCG (1000 UT) tablet Take 1,000 Units by mouth every morning. 12/09/20   [provider]  hydrOXYzine (ATARAX) 10 MG tablet Take 10 mg by mouth 2 (two) times daily.    [provider]  insulin glargine (LANTUS) 100 UNIT/ML injection Inject 20 Units into the skin daily before breakfast.    [provider]  insulin glargine-yfgn (SEMGLEE) 100 UNIT/ML injection Inject 20 Units into the skin daily. Patient not taking: Reported on 04/04/2021 12/09/20   [provider]  levothyroxine (SYNTHROID) 100 MCG tablet Take 1 tablet (100 mcg total) by mouth daily. 04/06/21   [name], MD  methocarbamol (ROBAXIN) 500 MG tablet Take 1 tablet (500 mg total) by mouth 2 (two) times daily. 01/20/22   Garald Balding, PA-C  QUEtiapine (SEROQUEL) 200 MG tablet Take 200 mg by  mouth in the morning, at noon, and at bedtime. 05/05/20   [provider]  Tiotropium Bromide-Olodaterol 2.5-2.5 MCG/ACT AERS Inhale 2 puffs into the lungs daily. Patient not taking: Reported on 04/04/2021 12/09/20   [provider]      Allergies    Budesonide-formoterol fumarate and Trifluoperazine    Review of Systems   Review of Systems  Respiratory:  Positive for cough.     Physical Exam Updated Vital Signs BP 132/71 (BP Location: Left Arm)   Pulse 99   Temp 99.3 F (37.4 C) (Oral)   Resp 18   Ht '5\' 4"'$  (1.626 m)   Wt 46 kg   SpO2 97%   BMI 17.41 kg/m  Physical Exam Vitals and nursing note reviewed.  Constitutional:      Appearance: He is well-developed.  HENT:     Head: Normocephalic and atraumatic.  Eyes:     Pupils: Pupils are equal, round, and reactive to light.  Neck:     Vascular: No JVD.  Cardiovascular:     Rate and Rhythm: Normal rate and regular rhythm.     Heart sounds: No murmur heard.    No friction rub. No gallop.  Pulmonary:     Effort: No respiratory distress.     Breath sounds: No wheezing.  Abdominal:     General: There is no distension.     Tenderness: There is no abdominal tenderness. There is no guarding or  rebound.  Musculoskeletal:        General: Normal range of motion.     Cervical back: Normal range of motion and neck supple.  Skin:    Coloration: Skin is not pale.     Findings: No rash.  Neurological:     Mental Status: He is alert and oriented to person, place, and time.  Psychiatric:        Behavior: Behavior normal.     ED Results / Procedures / Treatments   Labs (all labs ordered are listed, but only abnormal results are displayed) Labs Reviewed - No data to display  EKG None  Radiology No results found.  Procedures Procedures    Medications Ordered in ED Medications - No data to display  ED Course/ Medical Decision Making/ A&P                             Medical Decision Making  72 yo  M with a chief complaints of cough and difficulty breathing.  This was documented earlier.  When I went in to see the patient he was not willing to talk with me.  I was discussing with him about how I was going to do a workup on him to assess his shortness of breath and he turned to me and say "fuck you" after some discussion he continued to be belligerent and not willing to have a workup performed.  This point I will discharge him.  I encouraged him to follow-up with his PCP.  6:18 PM:  I have discussed the diagnosis/risks/treatment options with the patient.  Evaluation and diagnostic testing in the emergency department does not suggest an emergent condition requiring admission or immediate intervention beyond what has been performed at this time.  They will follow up with PCP. We also discussed returning to the ED immediately if new or worsening sx occur. We discussed the sx which are most concerning (e.g., sudden worsening pain, fever, inability to tolerate by mouth) that necessitate immediate return. Medications administered to the patient during their visit and any new prescriptions provided to the patient are listed below.  Medications given during this visit Medications - No data to display   The patient appears reasonably screen and/or stabilized for discharge and I doubt any other medical condition or other [facility] requiring further screening, evaluation, or treatment in the ED at this time prior to discharge.          Final Clinical Impression(s) / ED Diagnoses Final diagnoses:  Cough, unspecified type    Rx / DC Orders ED Discharge Orders     None         Deno Etienne, DO 03/22/2022 1818

## 2022-04-02 NOTE — H&P (Incomplete)
NAME:  Bradley Tran, MRN:  [id number], DOB:  26-Jul-1950, LOS: 0 ADMISSION DATE:  03/28/2022, CONSULTATION DATE:  2/23 REFERRING MD:  Dr. Francia Greaves, CHIEF COMPLAINT: Acute respiratory failure with hypoxia  History of Present Illness:  Patient is a 72 year old male with pertinent PMH bipolar, DMT2, HLD, hypothyroidism presents to [facility] ED on 2/23 with acute respiratory failure with hypoxia.  Patient is homeless.  On 2/23 patient came to [facility] long ED for difficulty breathing.  Patient was belligerent and cussing at staff.  Patient was unwilling to have workup done and was sent home.  Later today patient was witnessed by bystanders having some increased SOB.  EMS called.  Patient tachypneic with sats 60-70% on room air.  Distant breath sounds.  Given breathing treatments, mag, and IV steroids.   Transferred to [facility] ED.  Upon arrival to [facility] ED patient still in respiratory distress.  Patient somnolent but arousable.  States he feels slightly better with breathing treatments.  States that a week ago he was diagnosed with COVID.  He has productive cough with bodyaches.  Vital stable.  Tmax 99.3 F.  VBG showing 7.15, 58, 145, 20.5.  Patient placed on BiPAP.  COVID-positive.  Cultures obtained.  CXR with no significant findings.  Pertinent ED labs: WBC 7.1, Hgb 9, ethanol WNL, UDS and UA pending, creat 6.57 (baseline 4), BUN 79, GFR 9, troponin 9, bnp 158.  Pertinent  Medical History   Past Medical History:  Diagnosis Date   Acute renal failure ([name])    Bipolar disorder, unspecified ([place])    Diabetes mellitus    Hyperlipidemia    Hypothyroidism      Significant Hospital Events: Including procedures, antibiotic start and stop dates in addition to other pertinent events   2/23 admitted to [facility] resp failure on bipap  Interim History / Subjective:  See above  Objective   Blood pressure 136/84, pulse 100, temperature 98.5 F (36.9 C), temperature source Rectal, resp. rate (!) 22, SpO2 100 %.    FiO2  (%):  [100 %] 100 %  No intake or output data in the 24 hours ending 03/19/2022 2339 There were no vitals filed for this visit.  Examination: General:  *** NAD; critically ill appearing on mech vent HEENT: MM pink/moist; ETT in place Neuro: Aox3; MAE; sedated; CV: s1s2, RRR ***, no m/r/g PULM:  dim clear BS bilaterally; [place] ***; on mech vent PRVC GI: soft, bsx4 active  Extremities: warm/dry, *** edema  Skin: no rashes or lesions    Resolved Hospital Problem list     Assessment & Plan:  Acute respiratory failure with hypoxia/hypercarbia Possible COPD exacerbation COVID Plan: -Continue BiPAP -Continue IV steroids -DuoNeb scheduled -Remdesivir -  DMT2 Plan: -SSI and CBG monitoring -Check A1c  AKI on CKD 4 Plan: -Trend BMP / urinary output -Replace electrolytes as indicated -Avoid nephrotoxic agents, ensure adequate renal perfusion  Anemia: Appears chronic Plan: -Trend CBC  HLD Plan: -Resume home statin  Hypothyroidism Plan: -Check TSH -Resume home Synthroid  Bipolar disorder Plan: -Resume home Seroquel and hydroxyzine  Best Practice (right click and "Reselect all SmartList Selections" daily)   Diet/type: {diet type:25684} DVT prophylaxis: {anticoagulation (Optional):25687} GI prophylaxis: [id number] Lines: {Central Venous Access:25771} Foley:  {Central Venous Access:25691} Code Status:  {Code Status:26939} Last date of multidisciplinary goals of care discussion [***]  Labs   CBC: Recent Labs  Lab 04/01/2022 2136 03/12/2022 2150 03/29/2022 2151  WBC 7.1  --   --   NEUTROABS 3.9  --   --  HGB 9.0* 9.5* 9.9*  HCT 29.7* 28.0* 29.0*  MCV 102.1*  --   --   PLT 171  --   --     Basic Metabolic Panel: Recent Labs  Lab 04/05/2022 2136 03/20/2022 2150 03/11/2022 2151  NA 143 144 145  K 3.8 3.9 3.9  CL 111  --  114*  CO2 19*  --   --   GLUCOSE 137*  --  129*  BUN 79*  --  90*  CREATININE 6.57*  --  7.20*  CALCIUM 8.4*  --   --    GFR: Estimated  Creatinine Clearance: 6.3 mL/min (A) (by C-G formula based on SCr of 7.2 mg/dL (H)). Recent Labs  Lab 04/06/2022 2136  WBC 7.1    Liver Function Tests: Recent Labs  Lab 03/19/2022 2136  AST 12*  ALT 19  ALKPHOS 79  BILITOT 0.5  PROT 7.6  ALBUMIN 3.9   No results for input(s): "LIPASE", "AMYLASE" in the last 168 hours. No results for input(s): "AMMONIA" in the last 168 hours.  ABG    Component Value Date/Time   PHART 7.246 (L) 04/04/2021 0327   PCO2ART 65.5 (HH) 04/04/2021 0327   PO2ART 92 04/04/2021 0327   HCO3 20.5 04/03/2022 2150   TCO2 23 03/13/2022 2151   ACIDBASEDEF 8.0 (H) 04/01/2022 2150   O2SAT 98 03/31/2022 2150     Coagulation Profile: No results for input(s): "INR", "PROTIME" in the last 168 hours.  Cardiac Enzymes: No results for input(s): "CKTOTAL", "CKMB", "CKMBINDEX", "TROPONINI" in the last 168 hours.  HbA1C: Hgb A1c MFr Bld  Date/Time Value Ref Range Status  04/04/2021 05:01 AM 6.9 (H) 4.8 - 5.6 % Final    Comment:    (NOTE) Pre diabetes:          5.7%-6.4%  Diabetes:              >6.4%  Glycemic control for   <7.0% adults with diabetes   12/29/2020 04:52 AM 6.6 (H) 4.8 - 5.6 % Final    Comment:    (NOTE) Pre diabetes:          5.7%-6.4%  Diabetes:              >6.4%  Glycemic control for   <7.0% adults with diabetes     CBG: No results for input(s): "GLUCAP" in the last 168 hours.  Review of Systems:   ***  Past Medical History:  He,  has a past medical history of Acute renal failure ([place]), Bipolar disorder, unspecified ([place]), Diabetes mellitus, Hyperlipidemia, and Hypothyroidism.   Surgical History:   Past Surgical History:  Procedure Laterality Date   OTHER SURGICAL HISTORY     lymph node removed from right arm     Social History:   reports that he quit smoking about 13 years ago. His smoking use included cigarettes. He has a 120.00 pack-year smoking history. He does not have any smokeless tobacco history on file. He  reports that he does not currently use alcohol. He reports that he does not currently use drugs.   Family History:  His family history is not on file.   Allergies Allergies  Allergen Reactions   Budesonide-Formoterol Fumarate Swelling    [facility]   Trifluoperazine Other (See Comments)    Muscle aches     Home Medications  Prior to Admission medications   Medication Sig Start Date End Date Taking? Authorizing Provider  amoxicillin-clavulanate (AUGMENTIN) 875-125 MG tablet Take  1 tablet by mouth 2 (two) times daily. 03/30/21   [provider]  apixaban (ELIQUIS) 5 MG TABS tablet Take 1 tablet (5 mg total) by mouth 2 (two) times daily. Patient not taking: Reported on 04/04/2021 01/06/21   [name], MD  atorvastatin (LIPITOR) 80 MG tablet Take 40 mg by mouth daily. Patient not taking: Reported on 04/04/2021 12/09/20   [provider]  calcitRIOL (ROCALTROL) 0.25 MCG capsule Take 1 capsule by mouth daily. Patient not taking: Reported on 04/04/2021 12/09/20   [provider]  Cholecalciferol 25 MCG (1000 UT) tablet Take 1,000 Units by mouth every morning. 12/09/20   [provider]  hydrOXYzine (ATARAX) 10 MG tablet Take 10 mg by mouth 2 (two) times daily.    [provider]  insulin glargine (LANTUS) 100 UNIT/ML injection Inject 20 Units into the skin daily before breakfast.    [provider]  insulin glargine-yfgn (SEMGLEE) 100 UNIT/ML injection Inject 20 Units into the skin daily. Patient not taking: Reported on 04/04/2021 12/09/20   [provider]  levothyroxine (SYNTHROID) 100 MCG tablet Take 1 tablet (100 mcg total) by mouth daily. 04/06/21   [name], MD  methocarbamol (ROBAXIN) 500 MG tablet Take 1 tablet (500 mg total) by mouth 2 (two) times daily. 01/20/22   Garald Balding, PA-C  QUEtiapine (SEROQUEL) 200 MG tablet Take 200 mg by mouth in the morning, at noon, and at bedtime.  05/05/20   [provider]  Tiotropium Bromide-Olodaterol 2.5-2.5 MCG/ACT AERS Inhale 2 puffs into the lungs daily. Patient not taking: Reported on 04/04/2021 12/09/20   [provider]     Critical care time: 45 minutes    JD Rexene Agent [facility] 03/28/2022, 11:39 PM  Please see [email or web address] for pager details.  From 7A-7P if no response, please call [phone]. After hours, please call ELink [phone].

## 2022-04-03 ENCOUNTER — Inpatient Hospital Stay (HOSPITAL_COMMUNITY): Payer: Medicare Other

## 2022-04-03 DIAGNOSIS — Z4682 Encounter for fitting and adjustment of non-vascular catheter: Secondary | ICD-10-CM | POA: Diagnosis not present

## 2022-04-03 DIAGNOSIS — J9601 Acute respiratory failure with hypoxia: Secondary | ICD-10-CM | POA: Diagnosis present

## 2022-04-03 DIAGNOSIS — E1122 Type 2 diabetes mellitus with diabetic chronic kidney disease: Secondary | ICD-10-CM | POA: Diagnosis present

## 2022-04-03 DIAGNOSIS — J9600 Acute respiratory failure, unspecified whether with hypoxia or hypercapnia: Secondary | ICD-10-CM | POA: Diagnosis not present

## 2022-04-03 DIAGNOSIS — G9341 Metabolic encephalopathy: Secondary | ICD-10-CM | POA: Diagnosis present

## 2022-04-03 DIAGNOSIS — R609 Edema, unspecified: Secondary | ICD-10-CM | POA: Diagnosis not present

## 2022-04-03 DIAGNOSIS — K922 Gastrointestinal hemorrhage, unspecified: Secondary | ICD-10-CM | POA: Diagnosis not present

## 2022-04-03 DIAGNOSIS — N184 Chronic kidney disease, stage 4 (severe): Secondary | ICD-10-CM | POA: Diagnosis present

## 2022-04-03 DIAGNOSIS — Z515 Encounter for palliative care: Secondary | ICD-10-CM | POA: Diagnosis not present

## 2022-04-03 DIAGNOSIS — E8729 Other acidosis: Secondary | ICD-10-CM | POA: Diagnosis present

## 2022-04-03 DIAGNOSIS — D631 Anemia in chronic kidney disease: Secondary | ICD-10-CM | POA: Diagnosis present

## 2022-04-03 DIAGNOSIS — E43 Unspecified severe protein-calorie malnutrition: Secondary | ICD-10-CM | POA: Diagnosis present

## 2022-04-03 DIAGNOSIS — Z5900 Homelessness unspecified: Secondary | ICD-10-CM | POA: Diagnosis not present

## 2022-04-03 DIAGNOSIS — Z8616 Personal history of COVID-19: Secondary | ICD-10-CM | POA: Diagnosis not present

## 2022-04-03 DIAGNOSIS — E870 Hyperosmolality and hypernatremia: Secondary | ICD-10-CM | POA: Diagnosis not present

## 2022-04-03 DIAGNOSIS — I472 Ventricular tachycardia, unspecified: Secondary | ICD-10-CM | POA: Diagnosis not present

## 2022-04-03 DIAGNOSIS — J9602 Acute respiratory failure with hypercapnia: Secondary | ICD-10-CM | POA: Diagnosis present

## 2022-04-03 DIAGNOSIS — Z66 Do not resuscitate: Secondary | ICD-10-CM | POA: Diagnosis not present

## 2022-04-03 DIAGNOSIS — F319 Bipolar disorder, unspecified: Secondary | ICD-10-CM | POA: Diagnosis present

## 2022-04-03 DIAGNOSIS — E039 Hypothyroidism, unspecified: Secondary | ICD-10-CM | POA: Diagnosis present

## 2022-04-03 DIAGNOSIS — I129 Hypertensive chronic kidney disease with stage 1 through stage 4 chronic kidney disease, or unspecified chronic kidney disease: Secondary | ICD-10-CM | POA: Diagnosis present

## 2022-04-03 DIAGNOSIS — U071 COVID-19: Secondary | ICD-10-CM | POA: Diagnosis present

## 2022-04-03 DIAGNOSIS — J9690 Respiratory failure, unspecified, unspecified whether with hypoxia or hypercapnia: Secondary | ICD-10-CM | POA: Diagnosis not present

## 2022-04-03 DIAGNOSIS — R0602 Shortness of breath: Secondary | ICD-10-CM | POA: Diagnosis not present

## 2022-04-03 DIAGNOSIS — R451 Restlessness and agitation: Secondary | ICD-10-CM | POA: Diagnosis not present

## 2022-04-03 DIAGNOSIS — D6959 Other secondary thrombocytopenia: Secondary | ICD-10-CM | POA: Diagnosis present

## 2022-04-03 DIAGNOSIS — E871 Hypo-osmolality and hyponatremia: Secondary | ICD-10-CM | POA: Diagnosis present

## 2022-04-03 DIAGNOSIS — J441 Chronic obstructive pulmonary disease with (acute) exacerbation: Secondary | ICD-10-CM | POA: Diagnosis present

## 2022-04-03 DIAGNOSIS — N179 Acute kidney failure, unspecified: Secondary | ICD-10-CM | POA: Diagnosis present

## 2022-04-03 DIAGNOSIS — F05 Delirium due to known physiological condition: Secondary | ICD-10-CM | POA: Diagnosis not present

## 2022-04-03 DIAGNOSIS — I469 Cardiac arrest, cause unspecified: Secondary | ICD-10-CM | POA: Diagnosis not present

## 2022-04-03 LAB — POCT I-STAT 7, (LYTES, BLD GAS, ICA,H+H)
Acid-base deficit: 14 mmol/L — ABNORMAL HIGH (ref 0.0–2.0)
Acid-base deficit: 12 mmol/L — ABNORMAL HIGH (ref 0.0–2.0)
Acid-base deficit: 9 mmol/L — ABNORMAL HIGH (ref 0.0–2.0)
Bicarbonate: 14.9 mmol/L — ABNORMAL LOW (ref 20.0–28.0)
Bicarbonate: 16.4 mmol/L — ABNORMAL LOW (ref 20.0–28.0)
Bicarbonate: 18.6 mmol/L — ABNORMAL LOW (ref 20.0–28.0)
Calcium, Ion: 1.31 mmol/L (ref 1.15–1.40)
Calcium, Ion: 1.23 mmol/L (ref 1.15–1.40)
Calcium, Ion: 1.21 mmol/L (ref 1.15–1.40)
HCT: 25 % — ABNORMAL LOW (ref 39.0–52.0)
HCT: 24 % — ABNORMAL LOW (ref 39.0–52.0)
HCT: 22 % — ABNORMAL LOW (ref 39.0–52.0)
Hemoglobin: 8.5 g/dL — ABNORMAL LOW (ref 13.0–17.0)
Hemoglobin: 8.2 g/dL — ABNORMAL LOW (ref 13.0–17.0)
Hemoglobin: 7.5 g/dL — ABNORMAL LOW (ref 13.0–17.0)
O2 Saturation: 97 %
O2 Saturation: 100 %
O2 Saturation: 94 %
Patient temperature: 97.6
Patient temperature: 98.6
Potassium: 4.7 mmol/L (ref 3.5–5.1)
Potassium: 5.1 mmol/L (ref 3.5–5.1)
Potassium: 4 mmol/L (ref 3.5–5.1)
Sodium: 146 mmol/L — ABNORMAL HIGH (ref 135–145)
Sodium: 146 mmol/L — ABNORMAL HIGH (ref 135–145)
Sodium: 145 mmol/L (ref 135–145)
TCO2: 16 mmol/L — ABNORMAL LOW (ref 22–32)
TCO2: 18 mmol/L — ABNORMAL LOW (ref 22–32)
TCO2: 20 mmol/L — ABNORMAL LOW (ref 22–32)
pCO2 arterial: 49.7 mmHg — ABNORMAL HIGH (ref 32–48)
pCO2 arterial: 47.1 mmHg (ref 32–48)
pCO2 arterial: 45.7 mmHg (ref 32–48)
pH, Arterial: 7.081 — CL (ref 7.35–7.45)
pH, Arterial: 7.149 — CL (ref 7.35–7.45)
pH, Arterial: 7.218 — ABNORMAL LOW (ref 7.35–7.45)
pO2, Arterial: 125 mmHg — ABNORMAL HIGH (ref 83–108)
pO2, Arterial: 397 mmHg — ABNORMAL HIGH (ref 83–108)
pO2, Arterial: 85 mmHg (ref 83–108)

## 2022-04-03 LAB — COMPREHENSIVE METABOLIC PANEL
ALT: 18 U/L (ref 0–44)
AST: 12 U/L — ABNORMAL LOW (ref 15–41)
Albumin: 3.6 g/dL (ref 3.5–5.0)
Alkaline Phosphatase: 74 U/L (ref 38–126)
Anion gap: 15 (ref 5–15)
BUN: 83 mg/dL — ABNORMAL HIGH (ref 8–23)
CO2: 16 mmol/L — ABNORMAL LOW (ref 22–32)
Calcium: 8.2 mg/dL — ABNORMAL LOW (ref 8.9–10.3)
Chloride: 111 mmol/L (ref 98–111)
Creatinine, Ser: 6.47 mg/dL — ABNORMAL HIGH (ref 0.61–1.24)
GFR, Estimated: 9 mL/min — ABNORMAL LOW (ref >60)
Glucose, Bld: 181 mg/dL — ABNORMAL HIGH (ref 70–99)
Potassium: 4 mmol/L (ref 3.5–5.1)
Sodium: 142 mmol/L (ref 135–145)
Total Bilirubin: 0.6 mg/dL (ref 0.3–1.2)
Total Protein: 7.1 g/dL (ref 6.5–8.1)

## 2022-04-03 LAB — CBC
HCT: 30.3 % — ABNORMAL LOW (ref 39.0–52.0)
HCT: 28.9 % — ABNORMAL LOW (ref 39.0–52.0)
Hemoglobin: 9.1 g/dL — ABNORMAL LOW (ref 13.0–17.0)
Hemoglobin: 9.1 g/dL — ABNORMAL LOW (ref 13.0–17.0)
MCH: 30.7 pg (ref 26.0–34.0)
MCH: 31.3 pg (ref 26.0–34.0)
MCHC: 30 g/dL (ref 30.0–36.0)
MCHC: 31.5 g/dL (ref 30.0–36.0)
MCV: 102.4 fL — ABNORMAL HIGH (ref 80.0–100.0)
MCV: 99.3 fL (ref 80.0–100.0)
Platelets: 160 10*3/uL (ref 150–400)
Platelets: 157 10*3/uL (ref 150–400)
RBC: 2.96 MIL/uL — ABNORMAL LOW (ref 4.22–5.81)
RBC: 2.91 MIL/uL — ABNORMAL LOW (ref 4.22–5.81)
RDW: 14.3 % (ref 11.5–15.5)
RDW: 14.2 % (ref 11.5–15.5)
WBC: 5.8 10*3/uL (ref 4.0–10.5)
WBC: 5 10*3/uL (ref 4.0–10.5)
nRBC: 0 % (ref 0.0–0.2)
nRBC: 0 % (ref 0.0–0.2)

## 2022-04-03 LAB — I-STAT ARTERIAL BLOOD GAS, ED
Acid-base deficit: 9 mmol/L — ABNORMAL HIGH (ref 0.0–2.0)
Bicarbonate: 18.5 mmol/L — ABNORMAL LOW (ref 20.0–28.0)
Calcium, Ion: 1.16 mmol/L (ref 1.15–1.40)
HCT: 27 % — ABNORMAL LOW (ref 39.0–52.0)
Hemoglobin: 9.2 g/dL — ABNORMAL LOW (ref 13.0–17.0)
O2 Saturation: 100 %
Patient temperature: 98.5
Potassium: 4 mmol/L (ref 3.5–5.1)
Sodium: 145 mmol/L (ref 135–145)
TCO2: 20 mmol/L — ABNORMAL LOW (ref 22–32)
pCO2 arterial: 45.7 mmHg (ref 32–48)
pH, Arterial: 7.215 — ABNORMAL LOW (ref 7.35–7.45)
pO2, Arterial: 204 mmHg — ABNORMAL HIGH (ref 83–108)

## 2022-04-03 LAB — BASIC METABOLIC PANEL
Anion gap: 18 — ABNORMAL HIGH (ref 5–15)
BUN: 85 mg/dL — ABNORMAL HIGH (ref 8–23)
CO2: 16 mmol/L — ABNORMAL LOW (ref 22–32)
Calcium: 8.6 mg/dL — ABNORMAL LOW (ref 8.9–10.3)
Chloride: 111 mmol/L (ref 98–111)
Creatinine, Ser: 6.1 mg/dL — ABNORMAL HIGH (ref 0.61–1.24)
GFR, Estimated: 9 mL/min — ABNORMAL LOW (ref >60)
Glucose, Bld: 200 mg/dL — ABNORMAL HIGH (ref 70–99)
Potassium: 4.9 mmol/L (ref 3.5–5.1)
Sodium: 145 mmol/L (ref 135–145)

## 2022-04-03 LAB — PROTIME-INR
INR: 1.3 — ABNORMAL HIGH (ref 0.8–1.2)
Prothrombin Time: 16.3 seconds — ABNORMAL HIGH (ref 11.4–15.2)

## 2022-04-03 LAB — LACTIC ACID, PLASMA
Lactic Acid, Venous: 0.8 mmol/L (ref 0.5–1.9)
Lactic Acid, Venous: 1 mmol/L (ref 0.5–1.9)

## 2022-04-03 LAB — HEMOGLOBIN A1C
Hgb A1c MFr Bld: 6.5 % — ABNORMAL HIGH (ref 4.8–5.6)
Mean Plasma Glucose: 139.85 mg/dL

## 2022-04-03 LAB — MRSA NEXT GEN BY PCR, NASAL: MRSA by PCR Next Gen: NOT DETECTED

## 2022-04-03 LAB — GLUCOSE, CAPILLARY
Glucose-Capillary: 142 mg/dL — ABNORMAL HIGH (ref 70–99)
Glucose-Capillary: 153 mg/dL — ABNORMAL HIGH (ref 70–99)
Glucose-Capillary: 165 mg/dL — ABNORMAL HIGH (ref 70–99)
Glucose-Capillary: 171 mg/dL — ABNORMAL HIGH (ref 70–99)
Glucose-Capillary: 91 mg/dL (ref 70–99)

## 2022-04-03 LAB — CREATININE, SERUM
Creatinine, Ser: 6.56 mg/dL — ABNORMAL HIGH (ref 0.61–1.24)
GFR, Estimated: 9 mL/min — ABNORMAL LOW (ref >60)

## 2022-04-03 LAB — PROCALCITONIN: Procalcitonin: 0.64 ng/mL

## 2022-04-03 LAB — T4, FREE: Free T4: 0.45 ng/dL — ABNORMAL LOW (ref 0.61–1.12)

## 2022-04-03 LAB — TROPONIN I (HIGH SENSITIVITY): Troponin I (High Sensitivity): 9 ng/L (ref <18)

## 2022-04-03 LAB — HIV ANTIBODY (ROUTINE TESTING W REFLEX): HIV Screen 4th Generation wRfx: NONREACTIVE

## 2022-04-03 LAB — TSH: TSH: 75.961 u[IU]/mL — ABNORMAL HIGH (ref 0.350–4.500)

## 2022-04-03 LAB — HEPARIN LEVEL (UNFRACTIONATED): Heparin Unfractionated: 0.19 IU/mL — ABNORMAL LOW (ref 0.30–0.70)

## 2022-04-03 LAB — CBG MONITORING, ED: Glucose-Capillary: 180 mg/dL — ABNORMAL HIGH (ref 70–99)

## 2022-04-03 LAB — MAGNESIUM: Magnesium: 2.3 mg/dL (ref 1.7–2.4)

## 2022-04-03 LAB — APTT: aPTT: 41 seconds — ABNORMAL HIGH (ref 24–36)

## 2022-04-03 MED ORDER — METHYLPREDNISOLONE SODIUM SUCC 125 MG IJ SOLR
80.0000 mg | Freq: Every day | INTRAMUSCULAR | Status: DC
  Administered 2022-04-03: 80 mg via INTRAVENOUS

## 2022-04-03 MED ORDER — DOCUSATE SODIUM 50 MG/5ML PO LIQD
100.0000 mg | Freq: Two times a day (BID) | ORAL | Status: DC
  Administered 2022-04-03 – 2022-04-05 (×5): 100 mg
  Filled 2022-04-03 (×5): qty 10

## 2022-04-03 MED ORDER — HEPARIN SODIUM (PORCINE) 5000 UNIT/ML IJ SOLN: 5000.0000 [IU] | Freq: Three times a day (TID) | INTRAMUSCULAR | Status: DC

## 2022-04-03 MED ORDER — NOREPINEPHRINE 4 MG/250ML-% IV SOLN
INTRAVENOUS | Status: AC
  Filled 2022-04-03: qty 250

## 2022-04-03 MED ORDER — IPRATROPIUM-ALBUTEROL 0.5-2.5 (3) MG/3ML IN SOLN
3.0000 mL | RESPIRATORY_TRACT | Status: DC
  Administered 2022-04-03 (×3): 3 mL via RESPIRATORY_TRACT
  Filled 2022-04-03 (×4): qty 3

## 2022-04-03 MED ORDER — CHLORHEXIDINE GLUCONATE CLOTH 2 % EX PADS
6.0000 | MEDICATED_PAD | Freq: Every day | CUTANEOUS | Status: DC
  Administered 2022-04-03 – 2022-04-11 (×9): 6 via TOPICAL

## 2022-04-03 MED ORDER — IPRATROPIUM-ALBUTEROL 0.5-2.5 (3) MG/3ML IN SOLN: 3.0000 mL | RESPIRATORY_TRACT | Status: DC | PRN

## 2022-04-03 MED ORDER — SODIUM CHLORIDE 0.9 % IV SOLN
200.0000 mg | Freq: Once | INTRAVENOUS | Status: AC
  Administered 2022-04-03: 200 mg via INTRAVENOUS
  Filled 2022-04-03: qty 40

## 2022-04-03 MED ORDER — DEXMEDETOMIDINE HCL IN NACL 400 MCG/100ML IV SOLN
0.0000 ug/kg/h | INTRAVENOUS | Status: DC
  Administered 2022-04-03: 0.4 ug/kg/h via INTRAVENOUS
  Administered 2022-04-03: 0.6 ug/kg/h via INTRAVENOUS
  Administered 2022-04-04 – 2022-04-05 (×2): 0.4 ug/kg/h via INTRAVENOUS
  Filled 2022-04-03 (×5): qty 100

## 2022-04-03 MED ORDER — INSULIN ASPART 100 UNIT/ML IJ SOLN
0.0000 [IU] | INTRAMUSCULAR | Status: DC
  Administered 2022-04-03 (×2): 1 [IU] via SUBCUTANEOUS
  Administered 2022-04-03 – 2022-04-04 (×4): 2 [IU] via SUBCUTANEOUS
  Administered 2022-04-04: 1 [IU] via SUBCUTANEOUS
  Administered 2022-04-04: 2 [IU] via SUBCUTANEOUS
  Administered 2022-04-04: 1 [IU] via SUBCUTANEOUS
  Administered 2022-04-05: 2 [IU] via SUBCUTANEOUS

## 2022-04-03 MED ORDER — DOXYCYCLINE HYCLATE 100 MG PO TABS: 100.0000 mg | ORAL_TABLET | Freq: Two times a day (BID) | ORAL | Status: DC

## 2022-04-03 MED ORDER — ETOMIDATE 2 MG/ML IV SOLN: 20.0000 mg | Freq: Once | INTRAVENOUS | Status: AC

## 2022-04-03 MED ORDER — SODIUM BICARBONATE 8.4 % IV SOLN
INTRAVENOUS | Status: AC
  Administered 2022-04-03: 50 meq via INTRAVENOUS
  Filled 2022-04-03: qty 50

## 2022-04-03 MED ORDER — ETOMIDATE 2 MG/ML IV SOLN
INTRAVENOUS | Status: AC
  Administered 2022-04-03: 20 mg via INTRAVENOUS
  Filled 2022-04-03: qty 20

## 2022-04-03 MED ORDER — POLYETHYLENE GLYCOL 3350 17 G PO PACK
17.0000 g | PACK | Freq: Every day | ORAL | Status: DC
  Administered 2022-04-03 – 2022-04-04 (×2): 17 g
  Filled 2022-04-03 (×3): qty 1

## 2022-04-03 MED ORDER — METHYLPREDNISOLONE SODIUM SUCC 40 MG IJ SOLR
40.0000 mg | Freq: Once | INTRAMUSCULAR | Status: AC
  Administered 2022-04-03: 40 mg via INTRAVENOUS
  Filled 2022-04-03: qty 1

## 2022-04-03 MED ORDER — SODIUM CHLORIDE 0.9 % IV SOLN
100.0000 mg | Freq: Every day | INTRAVENOUS | Status: DC
  Administered 2022-04-04 – 2022-04-05 (×2): 100 mg via INTRAVENOUS
  Filled 2022-04-03 (×2): qty 20

## 2022-04-03 MED ORDER — FENTANYL CITRATE PF 50 MCG/ML IJ SOSY
100.0000 ug | PREFILLED_SYRINGE | Freq: Once | INTRAMUSCULAR | Status: AC
  Administered 2022-04-03: 100 ug via INTRAVENOUS

## 2022-04-03 MED ORDER — ORAL CARE MOUTH RINSE: 15.0000 mL | OROMUCOSAL | Status: DC | PRN

## 2022-04-03 MED ORDER — DOCUSATE SODIUM 100 MG PO CAPS: 100.0000 mg | ORAL_CAPSULE | Freq: Two times a day (BID) | ORAL | Status: DC | PRN

## 2022-04-03 MED ORDER — FENTANYL CITRATE PF 50 MCG/ML IJ SOSY
25.0000 ug | PREFILLED_SYRINGE | INTRAMUSCULAR | Status: DC | PRN
  Filled 2022-04-03 (×2): qty 1

## 2022-04-03 MED ORDER — LEVOTHYROXINE SODIUM 100 MCG/5ML IV SOLN
75.0000 ug | Freq: Every day | INTRAVENOUS | Status: DC
  Administered 2022-04-03 – 2022-04-05 (×3): 75 ug via INTRAVENOUS
  Filled 2022-04-03 (×3): qty 5

## 2022-04-03 MED ORDER — FENTANYL BOLUS VIA INFUSION
25.0000 ug | INTRAVENOUS | Status: DC | PRN
  Administered 2022-04-03 (×3): 50 ug via INTRAVENOUS
  Administered 2022-04-04 (×2): 25 ug via INTRAVENOUS
  Administered 2022-04-05: 50 ug via INTRAVENOUS
  Administered 2022-04-05: 25 ug via INTRAVENOUS
  Administered 2022-04-05: 50 ug via INTRAVENOUS

## 2022-04-03 MED ORDER — ATORVASTATIN CALCIUM 40 MG PO TABS: 40.0000 mg | ORAL_TABLET | Freq: Every day | ORAL | Status: DC

## 2022-04-03 MED ORDER — ROCURONIUM BROMIDE 10 MG/ML (PF) SYRINGE
PREFILLED_SYRINGE | INTRAVENOUS | Status: AC
  Filled 2022-04-03: qty 10

## 2022-04-03 MED ORDER — HYDROXYZINE HCL 10 MG PO TABS
10.0000 mg | ORAL_TABLET | Freq: Two times a day (BID) | ORAL | Status: DC
  Filled 2022-04-03: qty 1

## 2022-04-03 MED ORDER — HEPARIN (PORCINE) 25000 UT/250ML-% IV SOLN
850.0000 [IU]/h | INTRAVENOUS | Status: DC
  Administered 2022-04-03: 800 [IU]/h via INTRAVENOUS
  Administered 2022-04-04 – 2022-04-05 (×2): 950 [IU]/h via INTRAVENOUS
  Administered 2022-04-07: 850 [IU]/h via INTRAVENOUS
  Filled 2022-04-03 (×4): qty 250

## 2022-04-03 MED ORDER — FENTANYL CITRATE PF 50 MCG/ML IJ SOSY
PREFILLED_SYRINGE | INTRAMUSCULAR | Status: AC
  Administered 2022-04-03: 100 ug via INTRAVENOUS
  Filled 2022-04-03: qty 2

## 2022-04-03 MED ORDER — FENTANYL CITRATE PF 50 MCG/ML IJ SOSY: 25.0000 ug | PREFILLED_SYRINGE | INTRAMUSCULAR | Status: DC | PRN

## 2022-04-03 MED ORDER — LEVOTHYROXINE SODIUM 100 MCG PO TABS: 100.0000 ug | ORAL_TABLET | Freq: Every day | ORAL | Status: DC

## 2022-04-03 MED ORDER — FAMOTIDINE 20 MG PO TABS
10.0000 mg | ORAL_TABLET | Freq: Every day | ORAL | Status: DC
  Administered 2022-04-03 – 2022-04-04 (×2): 10 mg
  Filled 2022-04-03 (×2): qty 1

## 2022-04-03 MED ORDER — METHYLPREDNISOLONE SODIUM SUCC 125 MG IJ SOLR
60.0000 mg | Freq: Every day | INTRAMUSCULAR | Status: DC
  Administered 2022-04-04 – 2022-04-06 (×3): 60 mg via INTRAVENOUS
  Filled 2022-04-03 (×3): qty 2

## 2022-04-03 MED ORDER — STERILE WATER FOR INJECTION IV SOLN
INTRAVENOUS | Status: DC
  Filled 2022-04-03: qty 1000
  Filled 2022-04-03 (×2): qty 150

## 2022-04-03 MED ORDER — ORAL CARE MOUTH RINSE
15.0000 mL | OROMUCOSAL | Status: DC
  Administered 2022-04-03 – 2022-04-06 (×29): 15 mL via OROMUCOSAL

## 2022-04-03 MED ORDER — POLYETHYLENE GLYCOL 3350 17 G PO PACK
17.0000 g | PACK | Freq: Every day | ORAL | Status: DC | PRN
  Administered 2022-04-05: 17 g via ORAL

## 2022-04-03 MED ORDER — QUETIAPINE FUMARATE 100 MG PO TABS: 200.0000 mg | ORAL_TABLET | Freq: Three times a day (TID) | ORAL | Status: DC

## 2022-04-03 MED ORDER — IPRATROPIUM-ALBUTEROL 0.5-2.5 (3) MG/3ML IN SOLN
3.0000 mL | Freq: Two times a day (BID) | RESPIRATORY_TRACT | Status: DC
  Administered 2022-04-03 – 2022-04-07 (×7): 3 mL via RESPIRATORY_TRACT
  Filled 2022-04-03 (×7): qty 3

## 2022-04-03 MED ORDER — SODIUM BICARBONATE 8.4 % IV SOLN: 50.0000 meq | Freq: Once | INTRAVENOUS | Status: AC

## 2022-04-03 MED ORDER — FENTANYL 2500MCG IN NS 250ML (10MCG/ML) PREMIX INFUSION
25.0000 ug/h | INTRAVENOUS | Status: DC
  Administered 2022-04-03: 50 ug/h via INTRAVENOUS
  Administered 2022-04-04 – 2022-04-05 (×2): 140 ug/h via INTRAVENOUS
  Filled 2022-04-03 (×3): qty 250

## 2022-04-03 MED ORDER — SODIUM CHLORIDE 0.9 % IV SOLN
100.0000 mg | Freq: Two times a day (BID) | INTRAVENOUS | Status: DC
  Administered 2022-04-03 – 2022-04-05 (×4): 100 mg via INTRAVENOUS
  Filled 2022-04-03 (×5): qty 100

## 2022-04-03 MED ORDER — APIXABAN 5 MG PO TABS: 5.0000 mg | ORAL_TABLET | Freq: Two times a day (BID) | ORAL | Status: DC

## 2022-04-03 MED ORDER — FENTANYL CITRATE PF 50 MCG/ML IJ SOSY: 100.0000 ug | PREFILLED_SYRINGE | Freq: Once | INTRAMUSCULAR | Status: AC

## 2022-04-03 NOTE — Progress Notes (Signed)
Transported pt. To 22mvia bipap with no incident

## 2022-04-03 NOTE — Progress Notes (Signed)
eLink Physician-Brief Progress Note Patient Name: Bradley Tran DOB: [birth year] MRN: [id number]   Date of Service  04/03/2022  HPI/Events of Note  72 year old male that presents with acute respiratory failure with hypoxemia and hypercapnia with likely COPD exacerbation.  He was started on BiPAP therapy, tolerating it well, no significant agitation.  Given his COVID positivity, he was started on steroids and remdesivir.  Significant metabolic acidosis in addition to his respiratory dysfunction.  Acute kidney injury.  Microcytic anemia.  TSH 75.  eICU Interventions  Overall, no acute intervention is indicated with his respiratory failure.  Remdesivir will need to be dosed renally.  Continue steroids.  In the setting of his renal failure and acidosis, would likely benefit from fluids, but maintain euvolemic to hypovolemic fluid status.  Sliding scale insulin.  Takes 20 of glargine daily.  Agree with Precedex as needed.     Intervention Category Evaluation Type: New Patient Evaluation  Bradley Tran 04/03/2022, 5:49 AM

## 2022-04-03 NOTE — Progress Notes (Signed)
NAME:  Bradley Tran, MRN:  [id number], DOB:  [birth year], LOS: 0 ADMISSION DATE:  03/15/2022, CONSULTATION DATE:  2/23 REFERRING MD:  Dr. Francia Greaves, CHIEF COMPLAINT: Acute respiratory failure with hypoxia  History of Present Illness:  Patient is a 72 year old male with pertinent PMH COPD, bipolar, DMT2, hypothyroidism, PE in nov 2022 no longer on ac presents to [facility] ED on 2/23 with acute respiratory failure with hypoxia.  Patient is homeless.  On 2/23 patient came to [facility] long ED for difficulty breathing.  Patient was belligerent and cussing at staff.  Patient was unwilling to have workup done and was sent home.  Later today patient was witnessed by bystanders having some increased SOB.  EMS called.  Patient tachypneic with sats 60-70% on room air.  Distant breath sounds.  Given breathing treatments, mag, and IV steroids.   Transferred to [facility] ED.  Upon arrival to [facility] ED patient still in respiratory distress.  Patient somnolent but arousable.  States he feels slightly better with breathing treatments.  States that a week ago he was diagnosed with COVID.  He has productive cough with bodyaches.  Vital stable.  Tmax 99.3 F.  VBG showing 7.15, 58, 145, 20.5.  Patient placed on BiPAP.  COVID-positive.  Cultures obtained.  CXR with no significant findings. Patient had episode of agitation ripping off bipap and pulling out iv's. Given ativan. PCCM consulted for icu admission.  Pertinent ED labs: WBC 7.1, Hgb 9, ethanol WNL, UDS and UA pending, creat 6.57 (baseline 4), BUN 79, GFR 9, troponin 9, bnp 158.  Pertinent  Medical History   Past Medical History:  Diagnosis Date   Acute renal failure ([name])    Bipolar disorder, unspecified ([place])    Diabetes mellitus    Hyperlipidemia    Hypothyroidism      Significant Hospital Events: Including procedures, antibiotic start and stop dates in addition to other pertinent events   2/23 admitted to [facility] resp failure on bipap  Interim History / Subjective:   Admitted overnight Remains on BiPAP Started on Precedex. Agitated when weaned off Bladder scan >850 cc. Foley placed  Objective   Blood pressure 94/61, pulse 72, temperature 97.8 F (36.6 C), temperature source Axillary, resp. rate 20, weight 55.9 kg, SpO2 100 %.    FiO2 (%):  [40 %-100 %] 40 %   Intake/Output Summary (Last 24 hours) at 04/03/2022 1054 Last data filed at 04/03/2022 1030 Gross per 24 hour  Intake 693.07 ml  Output 700 ml  Net -6.93 ml   Filed Weights   04/03/22 0500  Weight: 55.9 kg   Physical Exam: General: Chronically ill-appearing, drowsy, no acute distress HENT: [place], AT, BiPAP in place Eyes: EOMI, no scleral icterus Respiratory: Clear to auscultation bilaterally.  No crackles, wheezing or rales Cardiovascular: RRR, -M/R/G, no JVD GI: BS+, soft, nontender Extremities:-Edema,-tenderness Neuro: AAO x4, CNII-XII grossly intact GU: Foley in place    Resolved Hospital Problem list     Assessment & Plan:  Acute respiratory failure with hypoxia/hypercarbia COPD exacerbation COVID positive Plan: -Wean BiPAP to [place] when able -Precedex if having anxiety/agitation w/ bipap -High risk for intubation. Remain in ICU to monitor for respiratory distress -Steroids, doxycycline -Neb: Duoneb -Remdesivir -air born precautions  DMT2 Plan: -Hold long acting for now -SSI and CBG monitoring -Check A1c  AKI on CKD 4 Plan: -Foley -Trend BMP / urinary output -Replace electrolytes as indicated -Avoid nephrotoxic agents, ensure adequate renal perfusion  Prolonged qtc Plan: -avoid qtc prolonging agents -telemetry monitoring  Anemia: Appears chronic Plan: -Trend CBC  Hypothyroidism TSH 75 Plan: -Check free T4 -Change to IV synthroid  Bipolar disorder Plan: -Resume home hydroxyzine when able -hold Seroquel given prolonged qtc  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 Unclear if provoked or unprovoked Risk for VTE in setting of  COVID  -Start heparin gtt  Best Practice (right click and "Reselect all SmartList Selections" daily)   Diet/type: NPO w/ oral meds DVT prophylaxis: prophylactic heparin  GI prophylaxis: N/A Lines: N/A Foley:  N/A Code Status:  full code Last date of multidisciplinary goals of care discussion [no contact on file]   Critical care time: 45 minutes    The patient is critically ill with multiple organ systems failure and requires high complexity decision making for assessment and support, frequent evaluation and titration of therapies, application of advanced monitoring technologies and extensive interpretation of multiple databases.  Independent Critical Care Time: 63 Minutes.   Rodman Pickle, M.D. [facility] Pulmonary/Critical Care Medicine 04/03/2022 10:54 AM   Please see Amion for pager number to reach on-call Pulmonary and Critical Care Team.

## 2022-04-03 NOTE — Progress Notes (Deleted)
[place] for IV Heparin Indication:  hx of PE, prior apixaban but has stopped; now with acute resp distress and COVID and unable to obtain CTA  Allergies  Allergen Reactions   Budesonide-Formoterol Fumarate Swelling    [facility]   Trifluoperazine Other (See Comments)    Muscle aches    Patient Measurements: Weight: 55.9 kg (123 lb 3.8 oz) Heparin Dosing Weight: 46 kg  Vital Signs: Temp: 97 F (36.1 C) (02/24 2009) Temp Source: Axillary (02/24 2009) BP: 124/80 (02/24 1900) Pulse Rate: 60 (02/24 1930)  Labs: Recent Labs    04/07/2022 2136 03/15/2022 2150 03/13/2022 2327 04/03/22 0142 04/03/22 0200 04/03/22 0538 04/03/22 1208 04/03/22 1433 04/03/22 2003  HGB 9.0*   < >  --  9.1*   < > 9.1* 8.5* 8.2*  --   HCT 29.7*   < >  --  30.3*   < > 28.9* 25.0* 24.0*  --   PLT 171  --   --  160  --  157  --   --   --   APTT  --   --   --   --   --  41*  --   --   --   LABPROT  --   --   --   --   --  16.3*  --   --   --   INR  --   --   --   --   --  1.3*  --   --   --   HEPARINUNFRC  --   --   --   --   --   --   --   --  0.19*  CREATININE 6.57*   < >  --  6.56*  --  6.47*  --   --  6.10*  TROPONINIHS 9  --  9  --   --   --   --   --   --    < > = values in this interval not displayed.     Estimated Creatinine Clearance: 9 mL/min (A) (by C-G formula based on SCr of 6.1 mg/dL (H)).   Medical History: Past Medical History:  Diagnosis Date   Acute renal failure ([name])    Bipolar disorder, unspecified ([place])    Diabetes mellitus    Hyperlipidemia    Hypothyroidism     Medications:  Medications Prior to Admission  Medication Sig Dispense Refill Last Dose   amoxicillin-clavulanate (AUGMENTIN) 875-125 MG tablet Take 1 tablet by mouth 2 (two) times daily.      apixaban (ELIQUIS) 5 MG TABS tablet Take 1 tablet (5 mg total) by mouth 2 (two) times daily. (Patient not taking: Reported on 04/04/2021) 60 tablet 1     atorvastatin (LIPITOR) 80 MG tablet Take 40 mg by mouth daily. (Patient not taking: Reported on 04/04/2021)      calcitRIOL (ROCALTROL) 0.25 MCG capsule Take 1 capsule by mouth daily. (Patient not taking: Reported on 04/04/2021)      Cholecalciferol 25 MCG (1000 UT) tablet Take 1,000 Units by mouth every morning.      hydrOXYzine (ATARAX) 10 MG tablet Take 10 mg by mouth 2 (two) times daily.      insulin glargine (LANTUS) 100 UNIT/ML injection Inject 20 Units into the skin daily before breakfast.      insulin glargine-yfgn (SEMGLEE) 100 UNIT/ML injection Inject 20 Units into the skin daily. (Patient not taking: Reported  on 04/04/2021)      levothyroxine (SYNTHROID) 100 MCG tablet Take 1 tablet (100 mcg total) by mouth daily. 30 tablet 2    methocarbamol (ROBAXIN) 500 MG tablet Take 1 tablet (500 mg total) by mouth 2 (two) times daily. 20 tablet 0    QUEtiapine (SEROQUEL) 200 MG tablet Take 200 mg by mouth in the morning, at noon, and at bedtime.      Tiotropium Bromide-Olodaterol 2.5-2.5 MCG/ACT AERS Inhale 2 puffs into the lungs daily. (Patient not taking: Reported on 04/04/2021)       Assessment: 72 years of age male with a history of PE in Nov 2022 prior on Apixaban but has since stopped who now presents in respiratory distress with COVID + and unable to obtain CTA. Pharmacy consulted for IV Heparin therapy for potential PE.   -heparin level= 0.19 on 800 units/hr  Goal of Therapy:  Heparin level 0.3-0.7 units/ml Monitor platelets by anticoagulation protocol: Yes   Plan:  No bolus per discussion with Dr. Loanne Drilling.  Increase heparin to 950 units/hr Heparin level in 8 hours and daily wth CBC daily  Hildred Laser, PharmD Clinical Pharmacist **Pharmacist phone directory can now be found on [email or web address] ([email or web address]).  Listed under [place].

## 2022-04-03 NOTE — Progress Notes (Signed)
ANTICOAGULATION CONSULT NOTE - Initial Consult  Pharmacy Consult for IV Heparin Indication:  hx of PE, prior apixaban but has stopped; now with acute resp distress and COVID and unable to obtain CTA  Allergies  Allergen Reactions   Budesonide-Formoterol Fumarate Swelling    [facility]   Trifluoperazine Other (See Comments)    Muscle aches    Patient Measurements: Weight: 55.9 kg (123 lb 3.8 oz) Heparin Dosing Weight: 46 kg  Vital Signs: Temp: 97.6 F (36.4 C) (02/24 1134) Temp Source: Axillary (02/24 1134) BP: 85/58 (02/24 1100) Pulse Rate: 61 (02/24 1100)  Labs: Recent Labs    03/24/2022 2136 03/24/2022 2150 04/05/2022 2151 04/03/2022 2327 04/03/22 0142 04/03/22 0200 04/03/22 0538  HGB 9.0*   < > 9.9*  --  9.1* 9.2* 9.1*  HCT 29.7*   < > 29.0*  --  30.3* 27.0* 28.9*  PLT 171  --   --   --  160  --  157  APTT  --   --   --   --   --   --  41*  LABPROT  --   --   --   --   --   --  16.3*  INR  --   --   --   --   --   --  1.3*  CREATININE 6.57*  --  7.20*  --  6.56*  --  6.47*  TROPONINIHS 9  --   --  9  --   --   --    < > = values in this interval not displayed.    Estimated Creatinine Clearance: 8.5 mL/min (A) (by C-G formula based on SCr of 6.47 mg/dL (H)).   Medical History: Past Medical History:  Diagnosis Date   Acute renal failure ([name])    Bipolar disorder, unspecified ([place])    Diabetes mellitus    Hyperlipidemia    Hypothyroidism     Medications:  Medications Prior to Admission  Medication Sig Dispense Refill Last Dose   amoxicillin-clavulanate (AUGMENTIN) 875-125 MG tablet Take 1 tablet by mouth 2 (two) times daily.      apixaban (ELIQUIS) 5 MG TABS tablet Take 1 tablet (5 mg total) by mouth 2 (two) times daily. (Patient not taking: Reported on 04/04/2021) 60 tablet 1    atorvastatin (LIPITOR) 80 MG tablet Take 40 mg by mouth daily. (Patient not taking: Reported on 04/04/2021)      calcitRIOL (ROCALTROL) 0.25 MCG capsule Take 1  capsule by mouth daily. (Patient not taking: Reported on 04/04/2021)      Cholecalciferol 25 MCG (1000 UT) tablet Take 1,000 Units by mouth every morning.      hydrOXYzine (ATARAX) 10 MG tablet Take 10 mg by mouth 2 (two) times daily.      insulin glargine (LANTUS) 100 UNIT/ML injection Inject 20 Units into the skin daily before breakfast.      insulin glargine-yfgn (SEMGLEE) 100 UNIT/ML injection Inject 20 Units into the skin daily. (Patient not taking: Reported on 04/04/2021)      levothyroxine (SYNTHROID) 100 MCG tablet Take 1 tablet (100 mcg total) by mouth daily. 30 tablet 2    methocarbamol (ROBAXIN) 500 MG tablet Take 1 tablet (500 mg total) by mouth 2 (two) times daily. 20 tablet 0    QUEtiapine (SEROQUEL) 200 MG tablet Take 200 mg by mouth in the morning, at noon, and at bedtime.      Tiotropium Bromide-Olodaterol 2.5-2.5 MCG/ACT AERS Inhale 2  puffs into the lungs daily. (Patient not taking: Reported on 04/04/2021)       Assessment: 72 years of age male with a history of PE in Nov 2022 prior on Apixaban but has since stopped who now presents in respiratory distress with COVID + and unable to obtain CTA. Pharmacy consulted for IV Heparin therapy for potential PE.   Hgb 9.1. Platelets wnl. Baseline aPTT slightly prolonged at 41. Baseline INR 1.3.  Patient noted to have severe AKI with SCr up to 6.47. Also on steroids for COVID and COPD exacerbation.   Goal of Therapy:  Heparin level 0.3-0.7 units/ml Monitor platelets by anticoagulation protocol: Yes   Plan:  No bolus per discussion with Dr. Loanne Drilling.  Start Heparin drip at 800 units/hr (~18 units/kg/hr) Check Heparin level in 8 hours.  Daily heparin level and CBC while on therapy.   Sloan Leiter, PharmD, BCPS, BCCCP Clinical Pharmacist Please refer to [facility] for [place] numbers 04/03/2022,11:56 AM

## 2022-04-03 NOTE — Progress Notes (Signed)
   04/03/22 0347  BiPAP/CPAP/SIPAP  BiPAP/CPAP/SIPAP Pt Type Adult  Mask Type Full face mask  Mask Size Medium  Set Rate 20 breaths/min  Respiratory Rate 20 breaths/min  IPAP 20 cmH20  EPAP 6 cmH2O  FiO2 (%) 70 %  Minute Ventilation 10.1  Leak 20  Peak Inspiratory Pressure (PIP) 20  Tidal Volume (Vt) 506  BiPAP/CPAP/SIPAP BiPAP  Patient Home Equipment No  Auto Titrate Yes  Press High Alarm 25 cmH2O  Press Low Alarm 5 cmH2O  BiPAP/CPAP /SiPAP Vitals  Pulse Rate 74  Resp 14  BP 116/70  SpO2 100 %  MEWS Score/Color  MEWS Score 0  MEWS Score Color Green   Pt. Found on above settings/

## 2022-04-03 NOTE — Progress Notes (Signed)
ABG criritcal results given to Dr. Loanne Drilling. Per verbal order of CCM, RT changed RR to 28.

## 2022-04-03 NOTE — ED Notes (Signed)
Condom cath placed.

## 2022-04-03 NOTE — Progress Notes (Signed)
[place] Progress Note Patient Name: Bradley Tran DOB: [birth year] MRN: [id number]   Date of Service  04/03/2022  HPI/Events of Note  Asking for ABG for AM.  AHHRF, Covid +, AKI on CKD. Intubated earlier in day.   Last ABG from noon still showing Ph< 7.20. Camera eval done: In synchrony with lung protective ventilation.   eICU Interventions  ABG now and for AM ordered     Intervention Category Intermediate Interventions: Other:;Respiratory distress - evaluation and management  Elmer Sow 04/03/2022, 9:10 PM  2:07 Urinalysis and rapid drug screen where not drawn when ordered yesterday. Would you like [place] to collect them now? On antibiotics and fentanyl   Ordered.  BMP stable. Anemia at 7.1, Cr > 6 , ABG pending  4:29 ABG: improving metabolic and resp acidosis.  K at 4.6 Hg stable at 7.5  Continue care

## 2022-04-03 NOTE — Progress Notes (Signed)
[place] for IV Heparin Indication:  hx of PE, prior apixaban but has stopped; now with acute resp distress and COVID and unable to obtain CTA  Allergies  Allergen Reactions   Budesonide-Formoterol Fumarate Swelling    [facility]   Trifluoperazine Other (See Comments)    Muscle aches    Patient Measurements: Weight: 55.9 kg (123 lb 3.8 oz) Heparin Dosing Weight: 46 kg  Vital Signs: Temp: 97 F (36.1 C) (02/24 2009) Temp Source: Axillary (02/24 2009) BP: 124/80 (02/24 1900) Pulse Rate: 60 (02/24 1930)  Labs: Recent Labs    03/18/2022 2136 03/15/2022 2150 03/23/2022 2327 04/03/22 0142 04/03/22 0200 04/03/22 0538 04/03/22 1208 04/03/22 1433 04/03/22 2003  HGB 9.0*   < >  --  9.1*   < > 9.1* 8.5* 8.2*  --   HCT 29.7*   < >  --  30.3*   < > 28.9* 25.0* 24.0*  --   PLT 171  --   --  160  --  157  --   --   --   APTT  --   --   --   --   --  41*  --   --   --   LABPROT  --   --   --   --   --  16.3*  --   --   --   INR  --   --   --   --   --  1.3*  --   --   --   HEPARINUNFRC  --   --   --   --   --   --   --   --  0.19*  CREATININE 6.57*   < >  --  6.56*  --  6.47*  --   --  6.10*  TROPONINIHS 9  --  9  --   --   --   --   --   --    < > = values in this interval not displayed.     Estimated Creatinine Clearance: 9 mL/min (A) (by C-G formula based on SCr of 6.1 mg/dL (H)).   Medical History: Past Medical History:  Diagnosis Date   Acute renal failure ([name])    Bipolar disorder, unspecified ([place])    Diabetes mellitus    Hyperlipidemia    Hypothyroidism     Medications:  Medications Prior to Admission  Medication Sig Dispense Refill Last Dose   amoxicillin-clavulanate (AUGMENTIN) 875-125 MG tablet Take 1 tablet by mouth 2 (two) times daily.      apixaban (ELIQUIS) 5 MG TABS tablet Take 1 tablet (5 mg total) by mouth 2 (two) times daily. (Patient not taking: Reported on 04/04/2021) 60 tablet 1     atorvastatin (LIPITOR) 80 MG tablet Take 40 mg by mouth daily. (Patient not taking: Reported on 04/04/2021)      calcitRIOL (ROCALTROL) 0.25 MCG capsule Take 1 capsule by mouth daily. (Patient not taking: Reported on 04/04/2021)      Cholecalciferol 25 MCG (1000 UT) tablet Take 1,000 Units by mouth every morning.      hydrOXYzine (ATARAX) 10 MG tablet Take 10 mg by mouth 2 (two) times daily.      insulin glargine (LANTUS) 100 UNIT/ML injection Inject 20 Units into the skin daily before breakfast.      insulin glargine-yfgn (SEMGLEE) 100 UNIT/ML injection Inject 20 Units into the skin daily. (Patient not taking: Reported  on 04/04/2021)      levothyroxine (SYNTHROID) 100 MCG tablet Take 1 tablet (100 mcg total) by mouth daily. 30 tablet 2    methocarbamol (ROBAXIN) 500 MG tablet Take 1 tablet (500 mg total) by mouth 2 (two) times daily. 20 tablet 0    QUEtiapine (SEROQUEL) 200 MG tablet Take 200 mg by mouth in the morning, at noon, and at bedtime.      Tiotropium Bromide-Olodaterol 2.5-2.5 MCG/ACT AERS Inhale 2 puffs into the lungs daily. (Patient not taking: Reported on 04/04/2021)       Assessment: 72 years of age male with a history of PE in Nov 2022 prior on Apixaban but has since stopped who now presents in respiratory distress with COVID + and unable to obtain CTA. Pharmacy consulted for IV Heparin therapy for potential PE.   Initial heparin level subtherapeutic on 800 units/hr  Goal of Therapy:  Heparin level 0.3-0.7 units/ml Monitor platelets by anticoagulation protocol: Yes   Plan:  Increase heparin gtt to 950 units/hr F/u 8 hour heparin level  Bertis Ruddy, PharmD, [place] Pharmacist ED Pharmacist Phone # [phone] 04/03/2022 9:03 PM

## 2022-04-03 NOTE — Procedures (Signed)
Intubation Procedure Note  Bradley Tran  [id number]  [birth year]  Date:04/03/22  Time:12:39 PM   Provider Performing:Kinlee Garrison Rodman Pickle, MD   Procedure: Intubation ([id number])  Indication(s) Respiratory Failure  Consent Unable to obtain consent due to emergent nature of procedure.   Anesthesia Etomidate and Fentanyl   Time Out Verified patient identification, verified procedure, site/side was marked, verified correct patient position, special equipment/implants available, medications/allergies/relevant history reviewed, required imaging and test results available.   Sterile Technique Usual hand hygeine, masks, and gloves were used   Procedure Description Patient positioned in bed supine.  Sedation given as noted above.  Patient was intubated with endotracheal tube using Glidescope.  View was Grade 1 full glottis .  Number of attempts was 1.  Colorimetric CO2 detector was consistent with tracheal placement.   Complications/Tolerance None; patient tolerated the procedure well. Chest X-Wechter is ordered to verify placement.   EBL Minimal   Specimen(s) None

## 2022-04-04 DIAGNOSIS — J9601 Acute respiratory failure with hypoxia: Secondary | ICD-10-CM | POA: Diagnosis not present

## 2022-04-04 LAB — BASIC METABOLIC PANEL
Anion gap: 16 — ABNORMAL HIGH (ref 5–15)
Anion gap: 20 — ABNORMAL HIGH (ref 5–15)
BUN: 86 mg/dL — ABNORMAL HIGH (ref 8–23)
BUN: 96 mg/dL — ABNORMAL HIGH (ref 8–23)
CO2: 18 mmol/L — ABNORMAL LOW (ref 22–32)
CO2: 19 mmol/L — ABNORMAL LOW (ref 22–32)
Calcium: 8.1 mg/dL — ABNORMAL LOW (ref 8.9–10.3)
Calcium: 8.2 mg/dL — ABNORMAL LOW (ref 8.9–10.3)
Chloride: 110 mmol/L (ref 98–111)
Chloride: 106 mmol/L (ref 98–111)
Creatinine, Ser: 6.04 mg/dL — ABNORMAL HIGH (ref 0.61–1.24)
Creatinine, Ser: 5.89 mg/dL — ABNORMAL HIGH (ref 0.61–1.24)
GFR, Estimated: 9 mL/min — ABNORMAL LOW (ref >60)
GFR, Estimated: 10 mL/min — ABNORMAL LOW (ref >60)
Glucose, Bld: 133 mg/dL — ABNORMAL HIGH (ref 70–99)
Glucose, Bld: 234 mg/dL — ABNORMAL HIGH (ref 70–99)
Potassium: 3.8 mmol/L (ref 3.5–5.1)
Potassium: 4.5 mmol/L (ref 3.5–5.1)
Sodium: 144 mmol/L (ref 135–145)
Sodium: 145 mmol/L (ref 135–145)

## 2022-04-04 LAB — POCT I-STAT 7, (LYTES, BLD GAS, ICA,H+H)
Acid-base deficit: 5 mmol/L — ABNORMAL HIGH (ref 0.0–2.0)
Bicarbonate: 21.7 mmol/L (ref 20.0–28.0)
Calcium, Ion: 1.19 mmol/L (ref 1.15–1.40)
HCT: 22 % — ABNORMAL LOW (ref 39.0–52.0)
Hemoglobin: 7.5 g/dL — ABNORMAL LOW (ref 13.0–17.0)
O2 Saturation: 94 %
Patient temperature: 98.3
Potassium: 4.6 mmol/L (ref 3.5–5.1)
Sodium: 145 mmol/L (ref 135–145)
TCO2: 23 mmol/L (ref 22–32)
pCO2 arterial: 46.7 mmHg (ref 32–48)
pH, Arterial: 7.273 — ABNORMAL LOW (ref 7.35–7.45)
pO2, Arterial: 83 mmHg (ref 83–108)

## 2022-04-04 LAB — BLOOD GAS, VENOUS
Acid-base deficit: 0.5 mmol/L (ref 0.0–2.0)
Bicarbonate: 24.2 mmol/L (ref 20.0–28.0)
Drawn by: 3499
O2 Saturation: 76 %
Patient temperature: 37
pCO2, Ven: 39 mmHg — ABNORMAL LOW (ref 44–60)
pH, Ven: 7.4 (ref 7.25–7.43)
pO2, Ven: 44 mmHg (ref 32–45)

## 2022-04-04 LAB — CBC
HCT: 22.4 % — ABNORMAL LOW (ref 39.0–52.0)
Hemoglobin: 7.1 g/dL — ABNORMAL LOW (ref 13.0–17.0)
MCH: 30.7 pg (ref 26.0–34.0)
MCHC: 31.7 g/dL (ref 30.0–36.0)
MCV: 97 fL (ref 80.0–100.0)
Platelets: 157 10*3/uL (ref 150–400)
RBC: 2.31 MIL/uL — ABNORMAL LOW (ref 4.22–5.81)
RDW: 14.2 % (ref 11.5–15.5)
WBC: 6.5 10*3/uL (ref 4.0–10.5)
nRBC: 0 % (ref 0.0–0.2)

## 2022-04-04 LAB — GLUCOSE, CAPILLARY
Glucose-Capillary: 100 mg/dL — ABNORMAL HIGH (ref 70–99)
Glucose-Capillary: 111 mg/dL — ABNORMAL HIGH (ref 70–99)
Glucose-Capillary: 121 mg/dL — ABNORMAL HIGH (ref 70–99)
Glucose-Capillary: 139 mg/dL — ABNORMAL HIGH (ref 70–99)
Glucose-Capillary: 195 mg/dL — ABNORMAL HIGH (ref 70–99)
Glucose-Capillary: 98 mg/dL (ref 70–99)

## 2022-04-04 LAB — URINALYSIS, ROUTINE W REFLEX MICROSCOPIC
Bilirubin Urine: NEGATIVE
Glucose, UA: NEGATIVE mg/dL
Hgb urine dipstick: NEGATIVE
Ketones, ur: NEGATIVE mg/dL
Nitrite: NEGATIVE
Protein, ur: 30 mg/dL — AB
Specific Gravity, Urine: 1.01 (ref 1.005–1.030)
pH: 5 (ref 5.0–8.0)

## 2022-04-04 LAB — PHOSPHORUS
Phosphorus: 4.5 mg/dL (ref 2.5–4.6)
Phosphorus: 4.9 mg/dL — ABNORMAL HIGH (ref 2.5–4.6)

## 2022-04-04 LAB — MAGNESIUM
Magnesium: 1.7 mg/dL (ref 1.7–2.4)
Magnesium: 1.8 mg/dL (ref 1.7–2.4)

## 2022-04-04 LAB — HEPARIN LEVEL (UNFRACTIONATED)
Heparin Unfractionated: 0.33 IU/mL (ref 0.30–0.70)
Heparin Unfractionated: 0.52 IU/mL (ref 0.30–0.70)

## 2022-04-04 MED ORDER — HYDROXYZINE HCL 10 MG PO TABS
10.0000 mg | ORAL_TABLET | Freq: Two times a day (BID) | ORAL | Status: DC
  Administered 2022-04-04 – 2022-04-05 (×3): 10 mg
  Filled 2022-04-04 (×5): qty 1

## 2022-04-04 MED ORDER — PROSOURCE TF20 ENFIT COMPATIBL EN LIQD
60.0000 mL | Freq: Every day | ENTERAL | Status: DC
  Administered 2022-04-04 – 2022-04-05 (×2): 60 mL
  Filled 2022-04-04 (×2): qty 60

## 2022-04-04 MED ORDER — VITAL HIGH PROTEIN PO LIQD
1000.0000 mL | ORAL | Status: DC
  Administered 2022-04-04: 1000 mL
  Filled 2022-04-04: qty 1000

## 2022-04-04 NOTE — Progress Notes (Signed)
[place] for IV Heparin Indication:  hx of PE, prior apixaban but has stopped; now with acute resp distress and COVID and unable to obtain CTA  Allergies  Allergen Reactions   Budesonide-Formoterol Fumarate Swelling    [facility]   Trifluoperazine Other (See Comments)    Muscle aches    Patient Measurements: Weight: 54.7 kg (120 lb 9.5 oz) Heparin Dosing Weight: 46 kg  Vital Signs: Temp: 98.1 F (36.7 C) (02/25 0730) Temp Source: Axillary (02/25 0730) BP: 110/75 (02/25 0800) Pulse Rate: 72 (02/25 0830)  Labs: Recent Labs    03/25/2022 2136 03/22/2022 2150 03/28/2022 2327 04/03/22 0142 04/03/22 0200 04/03/22 0538 04/03/22 1208 04/03/22 2003 04/03/22 2134 04/04/22 0101 04/04/22 0351  HGB 9.0*   < >  --  9.1*   < > 9.1*   < >  --  7.5* 7.1* 7.5*  HCT 29.7*   < >  --  30.3*   < > 28.9*   < >  --  22.0* 22.4* 22.0*  PLT 171  --   --  160  --  157  --   --   --  157  --   APTT  --   --   --   --   --  41*  --   --   --   --   --   LABPROT  --   --   --   --   --  16.3*  --   --   --   --   --   INR  --   --   --   --   --  1.3*  --   --   --   --   --   HEPARINUNFRC  --   --   --   --   --   --   --  0.19*  --  0.33  --   CREATININE 6.57*   < >  --  6.56*  --  6.47*  --  6.10*  --  6.04*  --   TROPONINIHS 9  --  9  --   --   --   --   --   --   --   --    < > = values in this interval not displayed.    Estimated Creatinine Clearance: 8.9 mL/min (A) (by C-G formula based on SCr of 6.04 mg/dL (H)).   Medical History: Past Medical History:  Diagnosis Date   Acute renal failure ([name])    Bipolar disorder, unspecified ([place])    Diabetes mellitus    Hyperlipidemia    Hypothyroidism     Medications:  Medications Prior to Admission  Medication Sig Dispense Refill Last Dose   amoxicillin-clavulanate (AUGMENTIN) 875-125 MG tablet Take 1 tablet by mouth 2 (two) times daily.      apixaban (ELIQUIS) 5 MG TABS tablet  Take 1 tablet (5 mg total) by mouth 2 (two) times daily. (Patient not taking: Reported on 04/04/2021) 60 tablet 1    atorvastatin (LIPITOR) 80 MG tablet Take 40 mg by mouth daily. (Patient not taking: Reported on 04/04/2021)      calcitRIOL (ROCALTROL) 0.25 MCG capsule Take 1 capsule by mouth daily. (Patient not taking: Reported on 04/04/2021)      Cholecalciferol 25 MCG (1000 UT) tablet Take 1,000 Units by mouth every morning.      hydrOXYzine (ATARAX) 10 MG tablet Take 10  mg by mouth 2 (two) times daily.      insulin glargine (LANTUS) 100 UNIT/ML injection Inject 20 Units into the skin daily before breakfast.      insulin glargine-yfgn (SEMGLEE) 100 UNIT/ML injection Inject 20 Units into the skin daily. (Patient not taking: Reported on 04/04/2021)      levothyroxine (SYNTHROID) 100 MCG tablet Take 1 tablet (100 mcg total) by mouth daily. 30 tablet 2    methocarbamol (ROBAXIN) 500 MG tablet Take 1 tablet (500 mg total) by mouth 2 (two) times daily. 20 tablet 0    QUEtiapine (SEROQUEL) 200 MG tablet Take 200 mg by mouth in the morning, at noon, and at bedtime.      Tiotropium Bromide-Olodaterol 2.5-2.5 MCG/ACT AERS Inhale 2 puffs into the lungs daily. (Patient not taking: Reported on 04/04/2021)       Assessment: 72 years of age male with a history of PE in Nov 2022 prior on Apixaban but has since stopped who now presents in respiratory distress with COVID + and unable to obtain CTA. Pharmacy consulted for IV Heparin therapy for potential PE.   Repeat Heparin level therapeutic at 0.33 on Heparin at 950 units/hr.  Hgb down at 75. Platelets stable at 157. No bleeding noted.  AKI improving slowly.   Goal of Therapy:  Heparin level 0.3-0.7 units/ml Monitor platelets by anticoagulation protocol: Yes   Plan:  Continue heparin gtt to 950 units/hr F/u 8 hour heparin level Daily heparin level and CBC while on therapy.   Sloan Leiter, PharmD, BCPS, BCCCP Clinical Pharmacist Please refer to [facility]  for [place] numbers 04/04/2022 10:39 AM

## 2022-04-04 NOTE — Progress Notes (Signed)
NAME:  Bradley Tran, MRN:  [id number], DOB:  04/01/1950, LOS: 1 ADMISSION DATE:  03/14/2022, CONSULTATION DATE:  2/23 REFERRING MD:  Dr. Francia Greaves, CHIEF COMPLAINT: Acute respiratory failure with hypoxia  History of Present Illness:  Patient is a 72 year old male with pertinent PMH COPD, bipolar, DMT2, hypothyroidism, PE in nov 2022 no longer on ac presents to [facility] ED on 2/23 with acute respiratory failure with hypoxia.  Patient is homeless.  On 2/23 patient came to [facility] long ED for difficulty breathing.  Patient was belligerent and cussing at staff.  Patient was unwilling to have workup done and was sent home.  Later today patient was witnessed by bystanders having some increased SOB.  EMS called.  Patient tachypneic with sats 60-70% on room air.  Distant breath sounds.  Given breathing treatments, mag, and IV steroids.   Transferred to [facility] ED.  Upon arrival to [facility] ED patient still in respiratory distress.  Patient somnolent but arousable.  States he feels slightly better with breathing treatments.  States that a week ago he was diagnosed with COVID.  He has productive cough with bodyaches.  Vital stable.  Tmax 99.3 F.  VBG showing 7.15, 58, 145, 20.5.  Patient placed on BiPAP.  COVID-positive.  Cultures obtained.  CXR with no significant findings. Patient had episode of agitation ripping off bipap and pulling out iv's. Given ativan. PCCM consulted for icu admission.  Pertinent ED labs: WBC 7.1, Hgb 9, ethanol WNL, UDS and UA pending, creat 6.57 (baseline 4), BUN 79, GFR 9, troponin 9, bnp 158.  Pertinent  Medical History   Past Medical History:  Diagnosis Date   Acute renal failure ([place])    Bipolar disorder, unspecified ([place])    Diabetes mellitus    Hyperlipidemia    Hypothyroidism      Significant Hospital Events: Including procedures, antibiotic start and stop dates in addition to other pertinent events   2/23 admitted to [facility] resp failure on bipap 2/24 Intubated  Interim History /  Subjective:  Intubated yesterday for worsening mental status Precedex for agitation On minimal vent settings Good UOP  Objective   Blood pressure 110/75, pulse 72, temperature 98.1 F (36.7 C), temperature source Axillary, resp. rate (!) 28, weight 54.7 kg, SpO2 97 %.    Vent Mode: PRVC FiO2 (%):  [40 %-100 %] 40 % Set Rate:  [24 bmp-28 bmp] 28 bmp Vt Set:  [470 mL] 470 mL PEEP:  [5 cmH20] 5 cmH20 Plateau Pressure:  [17 cmH20-25 cmH20] 17 cmH20   Intake/Output Summary (Last 24 hours) at 04/04/2022 0925 Last data filed at 04/04/2022 0600 Gross per 24 hour  Intake 3126 ml  Output 1600 ml  Net 1526 ml   Filed Weights   04/03/22 0500 04/04/22 0412  Weight: 55.9 kg 54.7 kg   Physical Exam: General: Chronically ill-appearing, drowsy but awakens to voice HENT: [place], AT, ETT in place Eyes: EOMI, no scleral icterus Respiratory: Clear to auscultation bilaterally.  No crackles, wheezing or rales Cardiovascular: RRR, -M/R/G, no JVD GI: BS+, soft, nontender Extremities:-Edema,-tenderness Neuro: Drowsy, intermittently follows commands, intermittently agitated GU: Foley in place  Improving serum bicarb Slowly improving Cr 6.04 Stable anemia   Resolved Hospital Problem list     Assessment & Plan:  Acute respiratory failure with hypoxia/hypercarbia COPD exacerbation COVID positive Plan: -Full vent support -LTVV, 4-8cc/kg IBW with goal Pplat<30 and DP<15 -SBT/WUA daily. Extubation precluded due to mental status -PAD protocol for RASS goal -1  -VAP -Steroids, doxycycline -Neb: Duoneb -Remdesivir -  air born precautions  DMT2 Ha1c 6.5 Plan: -Hold long acting for now -SSI and CBG monitoring  AKI on CKD 4 - improving Plan: -Foley -Trend BMP / urinary output -Replace electrolytes as indicated -Avoid nephrotoxic agents, ensure adequate renal perfusion  Prolonged qtc Plan: -avoid qtc prolonging agents -telemetry monitoring  Anemia: Appears chronic Plan: -Trend  CBC  Hypothyroidism TSH 75 FT4 0.45 Plan: -IV synthroid  Bipolar disorder Plan: -Resume home hydroxyzine when able -hold Seroquel given prolonged qtc  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 Unclear if provoked or unprovoked Risk for VTE in setting of COVID -Continue heparin gtt  Best Practice (right click and "Reselect all SmartList Selections" daily)   Diet/type: NPO w/ oral meds DVT prophylaxis: prophylactic heparin  GI prophylaxis: N/A Lines: N/A Foley:  N/A Code Status:  full code Last date of multidisciplinary goals of care discussion [no contact on file]   Critical care time: 35 minutes    The patient is critically ill with multiple organ systems failure and requires high complexity decision making for assessment and support, frequent evaluation and titration of therapies, application of advanced monitoring technologies and extensive interpretation of multiple databases.  Independent Critical Care Time: 35 Minutes.   Rodman Pickle, M.D. [facility] Pulmonary/Critical Care Medicine 04/04/2022 9:26 AM   Please see Amion for pager number to reach on-call Pulmonary and Critical Care Team.

## 2022-04-04 NOTE — Progress Notes (Signed)
ANTICOAGULATION CONSULT NOTE  Pharmacy Consult for IV Heparin Indication:  hx of PE, prior apixaban but has stopped; now with acute resp distress and COVID and unable to obtain CTA  Allergies  Allergen Reactions   Budesonide-Formoterol Fumarate Swelling    [facility]   Trifluoperazine Other (See Comments)    Muscle aches    Patient Measurements: Weight: 54.7 kg (120 lb 9.5 oz) Heparin Dosing Weight: 46 kg  Vital Signs: Temp: 98.3 F (36.8 C) (02/25 1516) Temp Source: Axillary (02/25 1516) BP: 119/80 (02/25 1830) Pulse Rate: 71 (02/25 1845)  Labs: Recent Labs    03/20/2022 2136 03/22/2022 2150 03/13/2022 2327 04/03/22 0142 04/03/22 0200 04/03/22 0538 04/03/22 1208 04/03/22 2003 04/03/22 2134 04/04/22 0101 04/04/22 0351 04/04/22 1636 04/04/22 1946  HGB 9.0*   < >  --  9.1*   < > 9.1*   < >  --  7.5* 7.1* 7.5*  --   --   HCT 29.7*   < >  --  30.3*   < > 28.9*   < >  --  22.0* 22.4* 22.0*  --   --   PLT 171  --   --  160  --  157  --   --   --  157  --   --   --   APTT  --   --   --   --   --  41*  --   --   --   --   --   --   --   LABPROT  --   --   --   --   --  16.3*  --   --   --   --   --   --   --   INR  --   --   --   --   --  1.3*  --   --   --   --   --   --   --   HEPARINUNFRC  --   --   --   --   --   --   --  0.19*  --  0.33  --   --  0.52  CREATININE 6.57*   < >  --  6.56*  --  6.47*  --  6.10*  --  6.04*  --  5.89*  --   TROPONINIHS 9  --  9  --   --   --   --   --   --   --   --   --   --    < > = values in this interval not displayed.     Estimated Creatinine Clearance: 9.2 mL/min (A) (by C-G formula based on SCr of 5.89 mg/dL (H)).   Medical History: Past Medical History:  Diagnosis Date   Acute renal failure ([name])    Bipolar disorder, unspecified ([place])    Diabetes mellitus    Hyperlipidemia    Hypothyroidism     Medications:  Medications Prior to Admission  Medication Sig Dispense Refill Last Dose    amoxicillin-clavulanate (AUGMENTIN) 875-125 MG tablet Take 1 tablet by mouth 2 (two) times daily.      apixaban (ELIQUIS) 5 MG TABS tablet Take 1 tablet (5 mg total) by mouth 2 (two) times daily. (Patient not taking: Reported on 04/04/2021) 60 tablet 1    atorvastatin (LIPITOR) 80 MG tablet Take 40 mg by mouth daily. (Patient not taking:  Reported on 04/04/2021)      calcitRIOL (ROCALTROL) 0.25 MCG capsule Take 1 capsule by mouth daily. (Patient not taking: Reported on 04/04/2021)      Cholecalciferol 25 MCG (1000 UT) tablet Take 1,000 Units by mouth every morning.      hydrOXYzine (ATARAX) 10 MG tablet Take 10 mg by mouth 2 (two) times daily.      insulin glargine (LANTUS) 100 UNIT/ML injection Inject 20 Units into the skin daily before breakfast.      insulin glargine-yfgn (SEMGLEE) 100 UNIT/ML injection Inject 20 Units into the skin daily. (Patient not taking: Reported on 04/04/2021)      levothyroxine (SYNTHROID) 100 MCG tablet Take 1 tablet (100 mcg total) by mouth daily. 30 tablet 2    methocarbamol (ROBAXIN) 500 MG tablet Take 1 tablet (500 mg total) by mouth 2 (two) times daily. 20 tablet 0    QUEtiapine (SEROQUEL) 200 MG tablet Take 200 mg by mouth in the morning, at noon, and at bedtime.      Tiotropium Bromide-Olodaterol 2.5-2.5 MCG/ACT AERS Inhale 2 puffs into the lungs daily. (Patient not taking: Reported on 04/04/2021)       Assessment: 72 years of age male with a history of PE in Nov 2022 prior on Apixaban but has since stopped who now presents in respiratory distress with COVID + and unable to obtain CTA. Pharmacy consulted for IV Heparin therapy for potential PE.   -heparin level at goal on 950 units/hr  Goal of Therapy:  Heparin level 0.3-0.7 units/ml Monitor platelets by anticoagulation protocol: Yes   Plan:  Continue heparin gtt to 950 units/hr Daily heparin level and CBC while on therapy.   Hildred Laser, PharmD Clinical Pharmacist **Pharmacist phone directory can now be  found on [email or web address] ([email or web address]).  Listed under [place].

## 2022-04-05 DIAGNOSIS — J9601 Acute respiratory failure with hypoxia: Secondary | ICD-10-CM | POA: Diagnosis not present

## 2022-04-05 LAB — HEPARIN LEVEL (UNFRACTIONATED): Heparin Unfractionated: 0.65 IU/mL (ref 0.30–0.70)

## 2022-04-05 LAB — CBC
HCT: 25 % — ABNORMAL LOW (ref 39.0–52.0)
Hemoglobin: 7.8 g/dL — ABNORMAL LOW (ref 13.0–17.0)
MCH: 30.4 pg (ref 26.0–34.0)
MCHC: 31.2 g/dL (ref 30.0–36.0)
MCV: 97.3 fL (ref 80.0–100.0)
Platelets: 149 10*3/uL — ABNORMAL LOW (ref 150–400)
RBC: 2.57 MIL/uL — ABNORMAL LOW (ref 4.22–5.81)
RDW: 14.2 % (ref 11.5–15.5)
WBC: 7.3 10*3/uL (ref 4.0–10.5)
nRBC: 0 % (ref 0.0–0.2)

## 2022-04-05 LAB — GLUCOSE, CAPILLARY
Glucose-Capillary: 102 mg/dL — ABNORMAL HIGH (ref 70–99)
Glucose-Capillary: 143 mg/dL — ABNORMAL HIGH (ref 70–99)
Glucose-Capillary: 160 mg/dL — ABNORMAL HIGH (ref 70–99)
Glucose-Capillary: 169 mg/dL — ABNORMAL HIGH (ref 70–99)
Glucose-Capillary: 87 mg/dL (ref 70–99)
Glucose-Capillary: 88 mg/dL (ref 70–99)
Glucose-Capillary: 96 mg/dL (ref 70–99)

## 2022-04-05 LAB — BASIC METABOLIC PANEL
Anion gap: 15 (ref 5–15)
BUN: 114 mg/dL — ABNORMAL HIGH (ref 8–23)
CO2: 21 mmol/L — ABNORMAL LOW (ref 22–32)
Calcium: 8.1 mg/dL — ABNORMAL LOW (ref 8.9–10.3)
Chloride: 110 mmol/L (ref 98–111)
Creatinine, Ser: 5.8 mg/dL — ABNORMAL HIGH (ref 0.61–1.24)
GFR, Estimated: 10 mL/min — ABNORMAL LOW (ref >60)
Glucose, Bld: 115 mg/dL — ABNORMAL HIGH (ref 70–99)
Potassium: 3.9 mmol/L (ref 3.5–5.1)
Sodium: 146 mmol/L — ABNORMAL HIGH (ref 135–145)

## 2022-04-05 LAB — MAGNESIUM: Magnesium: 1.9 mg/dL (ref 1.7–2.4)

## 2022-04-05 LAB — PHOSPHORUS: Phosphorus: 5.6 mg/dL — ABNORMAL HIGH (ref 2.5–4.6)

## 2022-04-05 MED ORDER — POLYETHYLENE GLYCOL 3350 17 G PO PACK: 17.0000 g | PACK | Freq: Two times a day (BID) | ORAL | Status: DC

## 2022-04-05 MED ORDER — FREE WATER: 200.0000 mL | Freq: Three times a day (TID) | Status: DC

## 2022-04-05 MED ORDER — MAGNESIUM SULFATE 2 GM/50ML IV SOLN
2.0000 g | Freq: Once | INTRAVENOUS | Status: AC
  Administered 2022-04-05: 2 g via INTRAVENOUS
  Filled 2022-04-05: qty 50

## 2022-04-05 MED ORDER — IPRATROPIUM-ALBUTEROL 0.5-2.5 (3) MG/3ML IN SOLN: 3.0000 mL | Freq: Four times a day (QID) | RESPIRATORY_TRACT | Status: DC | PRN

## 2022-04-05 MED ORDER — SODIUM CHLORIDE 0.9 % IV SOLN
100.0000 mg | Freq: Once | INTRAVENOUS | Status: AC
  Administered 2022-04-05: 100 mg via INTRAVENOUS
  Filled 2022-04-05: qty 100

## 2022-04-05 MED ORDER — DOXYCYCLINE HYCLATE 100 MG PO TABS: 100.0000 mg | ORAL_TABLET | Freq: Two times a day (BID) | ORAL | Status: DC

## 2022-04-05 MED ORDER — LEVOTHYROXINE SODIUM 100 MCG PO TABS: 100.0000 ug | ORAL_TABLET | Freq: Every day | ORAL | Status: DC

## 2022-04-05 MED ORDER — ORAL CARE MOUTH RINSE: 15.0000 mL | OROMUCOSAL | Status: DC | PRN

## 2022-04-05 MED ORDER — VITAL 1.5 CAL PO LIQD: 1000.0000 mL | ORAL | Status: DC

## 2022-04-05 MED ORDER — INSULIN ASPART 100 UNIT/ML IJ SOLN
0.0000 [IU] | INTRAMUSCULAR | Status: DC
  Administered 2022-04-05 – 2022-04-06 (×2): 2 [IU] via SUBCUTANEOUS
  Administered 2022-04-07 (×2): 3 [IU] via SUBCUTANEOUS
  Administered 2022-04-07 – 2022-04-09 (×6): 2 [IU] via SUBCUTANEOUS
  Administered 2022-04-09 (×2): 3 [IU] via SUBCUTANEOUS
  Administered 2022-04-10: 5 [IU] via SUBCUTANEOUS
  Administered 2022-04-10: 2 [IU] via SUBCUTANEOUS
  Administered 2022-04-10 – 2022-04-11 (×6): 3 [IU] via SUBCUTANEOUS
  Administered 2022-04-11: 2 [IU] via SUBCUTANEOUS

## 2022-04-05 MED ORDER — ARFORMOTEROL TARTRATE 15 MCG/2ML IN NEBU
15.0000 ug | INHALATION_SOLUTION | Freq: Two times a day (BID) | RESPIRATORY_TRACT | Status: DC
  Administered 2022-04-05 – 2022-04-11 (×11): 15 ug via RESPIRATORY_TRACT
  Filled 2022-04-05 (×12): qty 2

## 2022-04-05 MED ORDER — REVEFENACIN 175 MCG/3ML IN SOLN
175.0000 ug | Freq: Every day | RESPIRATORY_TRACT | Status: DC
  Administered 2022-04-05 – 2022-04-11 (×7): 175 ug via RESPIRATORY_TRACT
  Filled 2022-04-05 (×7): qty 3

## 2022-04-05 NOTE — Procedures (Signed)
Extubation Procedure Note  Patient Details:   Name: Bradley Tran DOB: 1950/06/28 MRN: [id number]   Airway Documentation:    Vent end date: 04/05/22 Vent end time: 1148   Evaluation  O2 sats: currently acceptable Complications: Complications of desaturation after extubation. Patient did tolerate procedure fairly well. Bilateral Breath Sounds: Clear, Diminished   Yes  Per CCM order, RT extubated pt. Prior to extubation, pt did have a positive cuff leak. After extubation pt was able to state his name, pt's SpO2 decreased to 78% on 4L [place]. RT placed [place] in pt's mouth due to pt mouth breathing. MD notified and saw pt.  Jorje Guild 04/05/2022, 11:55 AM

## 2022-04-05 NOTE — Progress Notes (Signed)
Received patient from [place], [place] for remainder of night shift. While doing assessment pt became combative - cursing and punching. He is aware he is in the hospital but not answering any other questions. Intermittently following commands. O2 dropping below 90%. RN left the room for patient to settle down and O2 sats to raise. Sats are now currently at 97% from outside the room on [place].

## 2022-04-05 NOTE — Progress Notes (Signed)
Initial Nutrition Assessment  DOCUMENTATION CODES:   Not applicable, suspect malnutrition  INTERVENTION:   Continue tube feeding via OG tube: - Change to Vital 1.5 @ 50 ml/hr (1200 ml/day) - Continue PROSource TF20 60 ml daily - Add free water flushes 200 ml q 8 hours  Tube feeding regimen provides 1880 kcal, 101 grams of protein, and 917 ml of H2O.  Total free water with flushes: 1517 ml  NUTRITION DIAGNOSIS:   Inadequate oral intake related to inability to eat as evidenced by NPO status.  GOAL:   Patient will meet greater than or equal to 90% of their needs  MONITOR:   Vent status, Labs, Weight trends, TF tolerance, I & O's  REASON FOR ASSESSMENT:   Ventilator, Consult Enteral/tube feeding initiation and management (trickle tube feeding)  ASSESSMENT:   72 year old male who presented to the ED on 2/23 with SOB, recent COVID diagnosis. PMH of COPD, bipolar disorder, T2DM, HLD, hypothyroidism, PE, homelessness. Pt admitted with acute respiratory failure, AKI.  02/23 - admitted on BiPAP 02/24 - intubated  RD working remotely. Consult received for trickle tube feeds; however, pt currently receiving tube feeds above trickle rate per protocol ordered by MD. MD okay with continuing TF above a trickle rate at this time.  Discussed pt with RN. Pt tolerating current tube feeds. Will adjust regimen to better meet pt's needs.  Pt with OG tube distal tip and side port projecting within the proximal stomach per chest x-[place] on 2/24.  Per nursing documentation, pt with non-pitting generalized edema.  Admit weight: 55.9 kg Current weight: 55.1 kg  Current TF: Vital High Protein @ 40 ml/hr, PROSource TF20 60 ml daily  Patient is currently intubated on ventilator support MV: 12.9 L/min Temp (24hrs), Avg:98.5 F (36.9 C), Min:98.2 F (36.8 C), Max:99.2 F (37.3 C)  Drips: Precedex Fentanyl Heparin  Medications reviewed and include: colace, pepcid, SSI q 4 hours, IV  solu-medrol, miralax, IV abx, IV magnesium sulfate 2 grams x 1, remdesivir  Labs reviewed: sodium 146, BUN 114, creatinine 5.80, phosphorus 5.6, hemoglobin 7.8, platelets 149, hemoglobin A1C 6.5 CBG's: 96-195 x 24 hours  UOP: 1550 ml x 24 hours I/O's: +2.6 L since admit  NUTRITION - FOCUSED PHYSICAL EXAM:  Unable to complete at this time. RD working remotely.  Diet Order:   Diet Order             Diet NPO time specified  Diet effective now                   EDUCATION NEEDS:   No education needs have been identified at this time  Skin:  Skin Assessment: Reviewed RN Assessment  Last BM:  no documented BM  Height:   Ht Readings from Last 1 Encounters:  03/17/2022 '5\' 4"'$  (1.626 m)    Weight:   Wt Readings from Last 1 Encounters:  04/05/22 55.1 kg    Ideal Body Weight:  59.1 kg  BMI:  Body mass index is 20.85 kg/m.  Estimated Nutritional Needs:   Kcal:  1800-2000  Protein:  85-100 grams  Fluid:  1.8-2.0 L    Gustavus Bryant, MS, RD, LDN Inpatient Clinical Dietitian Please see AMiON for contact information.

## 2022-04-05 NOTE — Plan of Care (Signed)
  Problem: Education: Goal: Knowledge of risk factors and measures for prevention of condition will improve Outcome: Not Progressing   Problem: Coping: Goal: Psychosocial and spiritual needs will be supported Outcome: Not Progressing   Problem: Respiratory: Goal: Will maintain a patent airway Outcome: Not Progressing Goal: Complications related to the disease process, condition or treatment will be avoided or minimized Outcome: Not Progressing   Problem: Education: Goal: Ability to describe self-care measures that may prevent or decrease complications (Diabetes Survival Skills Education) will improve Outcome: Not Progressing Goal: Individualized Educational Video(s) Outcome: Not Progressing   Problem: Coping: Goal: Ability to adjust to condition or change in health will improve Outcome: Not Progressing   Problem: Fluid Volume: Goal: Ability to maintain a balanced intake and output will improve Outcome: Not Progressing   Problem: Health Behavior/Discharge Planning: Goal: Ability to identify and utilize available resources and services will improve Outcome: Not Progressing Goal: Ability to manage health-related needs will improve Outcome: Not Progressing   Problem: Metabolic: Goal: Ability to maintain appropriate glucose levels will improve Outcome: Not Progressing   Problem: Nutritional: Goal: Maintenance of adequate nutrition will improve Outcome: Not Progressing Goal: Progress toward achieving an optimal weight will improve Outcome: Not Progressing   Problem: Skin Integrity: Goal: Risk for impaired skin integrity will decrease Outcome: Not Progressing   Problem: Tissue Perfusion: Goal: Adequacy of tissue perfusion will improve Outcome: Not Progressing   Problem: Health Behavior/Discharge Planning: Goal: Ability to manage health-related needs will improve Outcome: Not Progressing   Problem: Clinical Measurements: Goal: Ability to maintain clinical measurements  within normal limits will improve Outcome: Not Progressing Goal: Will remain free from infection Outcome: Not Progressing Goal: Diagnostic test results will improve Outcome: Not Progressing Goal: Respiratory complications will improve Outcome: Not Progressing Goal: Cardiovascular complication will be avoided Outcome: Not Progressing   Problem: Activity: Goal: Risk for activity intolerance will decrease Outcome: Not Progressing   Problem: Nutrition: Goal: Adequate nutrition will be maintained Outcome: Not Progressing   Problem: Coping: Goal: Level of anxiety will decrease Outcome: Not Progressing   Problem: Elimination: Goal: Will not experience complications related to bowel motility Outcome: Not Progressing Goal: Will not experience complications related to urinary retention Outcome: Not Progressing   Problem: Pain Managment: Goal: General experience of comfort will improve Outcome: Not Progressing   Problem: Safety: Goal: Ability to remain free from injury will improve Outcome: Not Progressing   Problem: Skin Integrity: Goal: Risk for impaired skin integrity will decrease Outcome: Not Progressing   Problem: Activity: Goal: Ability to tolerate increased activity will improve Outcome: Not Progressing   Problem: Role Relationship: Goal: Method of communication will improve Outcome: Not Progressing   Problem: Respiratory: Goal: Ability to maintain a clear airway and adequate ventilation will improve Outcome: Not Progressing  Patient total care but combative needing medication to help failed swallow and has excess secreations

## 2022-04-05 NOTE — Progress Notes (Signed)
ABG ordered for pt at 1714. Upon entering pt room, pt combative and uncooperative. RT will attempt at later time. Pt vitals stable and 89% on nasal cannula.

## 2022-04-05 NOTE — Progress Notes (Signed)
[facility] PCCM  On my evaluation this morning Passing SBT Awake, following commands, can hold head off pillow Extubation order written  Roselie Awkward, MD [place] P[facility]M Pager: [phone] Cell: [phone] After 7:00 pm call Elink  [phone]

## 2022-04-05 NOTE — Progress Notes (Signed)
NAME:  Bradley Tran, MRN:  [id number], DOB:  [birth year], LOS: 2 ADMISSION DATE:  03/27/2022, CONSULTATION DATE:  2/23 REFERRING MD:  Dr. Francia Greaves, CHIEF COMPLAINT: Acute respiratory failure with hypoxia  History of Present Illness:  Patient is a 72 year old male with pertinent PMH COPD, bipolar, DMT2, hypothyroidism, PE in nov 2022 no longer on ac presents to [facility] ED on 2/23 with acute respiratory failure with hypoxia.  Patient is homeless.  On 2/23 patient came to [facility] long ED for difficulty breathing.  Patient was belligerent and cussing at staff.  Patient was unwilling to have workup done and was sent home.  Later today patient was witnessed by bystanders having some increased SOB.  EMS called.  Patient tachypneic with sats 60-70% on room air.  Distant breath sounds.  Given breathing treatments, mag, and IV steroids.   Transferred to [facility] ED.  Upon arrival to [facility] ED patient still in respiratory distress.  Patient somnolent but arousable.  States he feels slightly better with breathing treatments.  States that a week ago he was diagnosed with COVID.  He has productive cough with bodyaches.  Vital stable.  Tmax 99.3 F.  VBG showing 7.15, 58, 145, 20.5.  Patient placed on BiPAP.  COVID-positive.  Cultures obtained.  CXR with no significant findings. Patient had episode of agitation ripping off bipap and pulling out iv's. Given ativan. PCCM consulted for icu admission.  Pertinent ED labs: WBC 7.1, Hgb 9, ethanol WNL, UDS and UA pending, creat 6.57 (baseline 4), BUN 79, GFR 9, troponin 9, bnp 158.  Pertinent  Medical History   Past Medical History:  Diagnosis Date   Acute renal failure ([place])    Bipolar disorder, unspecified ([place])    Diabetes mellitus    Hyperlipidemia    Hypothyroidism     Significant Hospital Events: Including procedures, antibiotic start and stop dates in addition to other pertinent events   2/23 admitted to [facility] resp failure on bipap 2/24 Intubated 2/26 No acute events  overnight, remains on vent 40 FIO2, low min ventilation on SBT trial this am   Interim History / Subjective:  Will arouse to verbal stimuli but sleepy on Precedex and Fentanyl   Objective   Blood pressure (!) 143/78, pulse 99, temperature 98.2 F (36.8 C), temperature source Axillary, resp. rate (!) 28, weight 55.1 kg, SpO2 95 %.    Vent Mode: PRVC FiO2 (%):  [40 %] 40 % Set Rate:  [28 bmp] 28 bmp Vt Set:  [470 mL] 470 mL PEEP:  [5 cmH20] 5 cmH20 Plateau Pressure:  [15 cmH20-17 cmH20] 17 cmH20   Intake/Output Summary (Last 24 hours) at 04/05/2022 0743 Last data filed at 04/05/2022 0400 Gross per 24 hour  Intake 1850.61 ml  Output 1550 ml  Net 300.61 ml    Filed Weights   04/03/22 0500 04/04/22 0412 04/05/22 0400  Weight: 55.9 kg 54.7 kg 55.1 kg   Physical Exam: General: Acute on chronically ill appearing deconditioned elderly male lying in bed on mechanical ventilation, in NAD HEENT: ETT, MM pink/moist, PERRL,  Neuro: Will open eyes to verbal stimuli but unable to follow commands  CV: s1s2 regular rate and rhythm, no murmur, rubs, or gallops,  PULM:  Faint expiratory wheeze, no increased work of breathing, no added breath sounds, tolerating vent  GI: soft, bowel sounds active in all 4 quadrants, non-tender, non-distended, tolerating TF Extremities: warm/dry, no edema  Skin: no rashes or lesions  Resolved Hospital Problem list     Assessment &  Plan:  Acute respiratory failure with hypoxia/hypercarbia COPD exacerbation COVID positive P: Continue ventilator support with lung protective strategies  Wean PEEP and FiO2 for sats greater than 90%. Head of bed elevated 30 degrees. Plateau pressures less than 30 cm H20.  Follow intermittent chest x-[place] and ABG.   SAT/SBT as tolerated, mentation preclude extubation  Ensure adequate pulmonary hygiene  Follow cultures  VAP bundle in place  PAD protocol, wean sedation as able  Continue steroids and Remdesivir  Doxycycline   BDs Airborne precautions   Type 2 diabetes  -Hgb1c 6.5 P: Continue SSI' CBG goal 140-180 Hold home long acting   Hx PE 12/2020  -Self discontinued anticoagulation 03/2021. Unclear if provoked or unprovoked Risk for VTE in setting of COVID P: Continue heparin drip   AKI on CKD 4  -Creatinine 4.01 with GFR 9 04/05/21, creatinine 6.47 with GFR 9 on admit  P: Follow renal function  Monitor urine output Trend Bmet Avoid nephrotoxins Ensure adequate renal perfusion   Hx of HTN/HLD  Prolonged qtc P: Continuous telemetry  Avoid prolonging agents   Anemia -Appears chronic Thrombocytopenia  P: Trend CBC  Transfuse per protocol  Hgb goal > 7  Hypothyroidism -TSH 75 FT4 0.45 P: Continue IV Synthroid   Bipolar disorder P: Resume home medications when able  Seroquel on hold given prolonged QTc  Best Practice (right click and "Reselect all SmartList Selections" daily)   Diet/type: NPO w/ oral meds DVT prophylaxis: prophylactic heparin  GI prophylaxis: N/A Lines: N/A Foley:  N/A Code Status:  full code Last date of multidisciplinary goals of care discussion [no contact on file]   Critical care time:  CRITICAL CARE Performed by: Zhania Shaheen D. Harris  Total critical care time: 38 minutes  Critical care time was exclusive of separately billable procedures and treating other patients.  Critical care was necessary to treat or prevent imminent or life-threatening deterioration.  Critical care was time spent personally by me on the following activities: development of treatment plan with patient and/or surrogate as well as nursing, discussions with consultants, evaluation of patient's response to treatment, examination of patient, obtaining history from patient or surrogate, ordering and performing treatments and interventions, ordering and review of laboratory studies, ordering and review of radiographic studies, pulse oximetry and re-evaluation of patient's  condition.  Ashawna Hanback D. Harris, NP-C [facility] Personal contact information can be found on Amion  If no contact or response made please call [phone] 04/05/2022, 7:45 AM

## 2022-04-05 NOTE — Progress Notes (Signed)
[place] for IV Heparin Indication:  hx of PE, prior apixaban but has stopped; now with acute resp distress and COVID and unable to obtain CTA  Allergies  Allergen Reactions   Budesonide-Formoterol Fumarate Swelling    Pharyngeal swelling [facility] by [facility]   Trifluoperazine Other (See Comments)    Muscle aches    Patient Measurements: Weight: 55.1 kg (121 lb 7.6 oz) Heparin Dosing Weight: 46 kg  Vital Signs: Temp: 98.2 F (36.8 C) (02/26 0348) Temp Source: Axillary (02/26 0348) BP: 109/75 (02/26 0846) Pulse Rate: 75 (02/26 0846)  Labs: Recent Labs    03/15/2022 2136 03/12/2022 2150 03/16/2022 2327 04/03/22 0142 04/03/22 0538 04/03/22 1208 04/04/22 0101 04/04/22 0351 04/04/22 1636 04/04/22 1946 04/05/22 0705  HGB 9.0*   < >  --    < > 9.1*   < > 7.1* 7.5*  --   --  7.8*  HCT 29.7*   < >  --    < > 28.9*   < > 22.4* 22.0*  --   --  25.0*  PLT 171  --   --    < > 157  --  157  --   --   --  149*  APTT  --   --   --   --  41*  --   --   --   --   --   --   LABPROT  --   --   --   --  16.3*  --   --   --   --   --   --   INR  --   --   --   --  1.3*  --   --   --   --   --   --   HEPARINUNFRC  --   --   --   --   --    < > 0.33  --   --  0.52 0.65  CREATININE 6.57*   < >  --    < > 6.47*   < > 6.04*  --  5.89*  --  5.80*  TROPONINIHS 9  --  9  --   --   --   --   --   --   --   --    < > = values in this interval not displayed.     Estimated Creatinine Clearance: 9.4 mL/min (A) (by C-G formula based on SCr of 5.8 mg/dL (H)).   Medical History: Past Medical History:  Diagnosis Date   Acute renal failure ([name])    Bipolar disorder, unspecified ([place])    Diabetes mellitus    Hyperlipidemia    Hypothyroidism     Medications:  Medications Prior to Admission  Medication Sig Dispense Refill Last Dose   apixaban (ELIQUIS) 5 MG TABS tablet Take 1 tablet (5 mg total) by mouth 2 (two) times daily. (Patient not taking: Reported on  04/04/2021) 60 tablet 1    atorvastatin (LIPITOR) 80 MG tablet Take 40 mg by mouth daily. (Patient not taking: Reported on 04/04/2021)      calcitRIOL (ROCALTROL) 0.25 MCG capsule Take 1 capsule by mouth daily. (Patient not taking: Reported on 04/04/2021)      Cholecalciferol 25 MCG (1000 UT) tablet Take 1,000 Units by mouth every morning.      hydrOXYzine (ATARAX) 10 MG tablet Take 10 mg by mouth 2 (two) times daily.  insulin glargine (LANTUS) 100 UNIT/ML injection Inject 20 Units into the skin daily before breakfast.      insulin glargine-yfgn (SEMGLEE) 100 UNIT/ML injection Inject 20 Units into the skin daily. (Patient not taking: Reported on 04/04/2021)      levothyroxine (SYNTHROID) 100 MCG tablet Take 1 tablet (100 mcg total) by mouth daily. 30 tablet 2    methocarbamol (ROBAXIN) 500 MG tablet Take 1 tablet (500 mg total) by mouth 2 (two) times daily. 20 tablet 0    QUEtiapine (SEROQUEL) 200 MG tablet Take 200 mg by mouth in the morning, at noon, and at bedtime.      Tiotropium Bromide-Olodaterol 2.5-2.5 MCG/ACT AERS Inhale 2 puffs into the lungs daily. (Patient not taking: Reported on 04/04/2021)       Assessment: 72 years of age male with a history of PE in Nov 2022 prior on Apixaban but has since stopped who now presents in respiratory distress with COVID + and unable to obtain CTA. Pharmacy consulted for IV Heparin therapy for potential PE.   -heparin level at goal (0.65) on 950 units/hr  Goal of Therapy:  Heparin level 0.3-0.7 units/ml Monitor platelets by anticoagulation protocol: Yes   Plan:  Continue heparin gtt to 950 units/hr Daily heparin level and CBC while on therapy.   Wilson Singer, PharmD Clinical Pharmacist 04/05/2022 10:26 AM
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
## 2022-04-06 ENCOUNTER — Inpatient Hospital Stay (HOSPITAL_COMMUNITY): Payer: Medicare Other

## 2022-04-06 ENCOUNTER — Other Ambulatory Visit (HOSPITAL_COMMUNITY): Payer: Medicare Other

## 2022-04-06 DIAGNOSIS — J9601 Acute respiratory failure with hypoxia: Secondary | ICD-10-CM | POA: Diagnosis not present

## 2022-04-06 DIAGNOSIS — R609 Edema, unspecified: Secondary | ICD-10-CM | POA: Diagnosis not present

## 2022-04-06 DIAGNOSIS — R0602 Shortness of breath: Secondary | ICD-10-CM

## 2022-04-06 LAB — URINE DRUGS OF ABUSE SCREEN W ALC, ROUTINE (REF LAB)
Amphetamines, Urine: NEGATIVE ng/mL
Barbiturate, Ur: NEGATIVE ng/mL
Benzodiazepine Quant, Ur: NEGATIVE ng/mL
Cannabinoid Quant, Ur: NEGATIVE ng/mL
Cocaine (Metab.): NEGATIVE ng/mL
Ethanol U, Quan: NEGATIVE %
Methadone Screen, Urine: NEGATIVE ng/mL
Opiate Quant, Ur: NEGATIVE ng/mL
Phencyclidine, Ur: NEGATIVE ng/mL
Propoxyphene, Urine: NEGATIVE ng/mL

## 2022-04-06 LAB — COMPREHENSIVE METABOLIC PANEL
ALT: 14 U/L (ref 0–44)
AST: 13 U/L — ABNORMAL LOW (ref 15–41)
Albumin: 2.9 g/dL — ABNORMAL LOW (ref 3.5–5.0)
Alkaline Phosphatase: 58 U/L (ref 38–126)
Anion gap: 19 — ABNORMAL HIGH (ref 5–15)
BUN: 132 mg/dL — ABNORMAL HIGH (ref 8–23)
CO2: 19 mmol/L — ABNORMAL LOW (ref 22–32)
Calcium: 8.4 mg/dL — ABNORMAL LOW (ref 8.9–10.3)
Chloride: 111 mmol/L (ref 98–111)
Creatinine, Ser: 5.88 mg/dL — ABNORMAL HIGH (ref 0.61–1.24)
GFR, Estimated: 10 mL/min — ABNORMAL LOW (ref >60)
Glucose, Bld: 105 mg/dL — ABNORMAL HIGH (ref 70–99)
Potassium: 4.7 mmol/L (ref 3.5–5.1)
Sodium: 149 mmol/L — ABNORMAL HIGH (ref 135–145)
Total Bilirubin: 0.6 mg/dL (ref 0.3–1.2)
Total Protein: 5.8 g/dL — ABNORMAL LOW (ref 6.5–8.1)

## 2022-04-06 LAB — CBC
HCT: 25.3 % — ABNORMAL LOW (ref 39.0–52.0)
Hemoglobin: 7.9 g/dL — ABNORMAL LOW (ref 13.0–17.0)
MCH: 31.3 pg (ref 26.0–34.0)
MCHC: 31.2 g/dL (ref 30.0–36.0)
MCV: 100.4 fL — ABNORMAL HIGH (ref 80.0–100.0)
Platelets: 138 10*3/uL — ABNORMAL LOW (ref 150–400)
RBC: 2.52 MIL/uL — ABNORMAL LOW (ref 4.22–5.81)
RDW: 14.5 % (ref 11.5–15.5)
WBC: 8.8 10*3/uL (ref 4.0–10.5)
nRBC: 0 % (ref 0.0–0.2)

## 2022-04-06 LAB — PHOSPHORUS
Phosphorus: 8.1 mg/dL — ABNORMAL HIGH (ref 2.5–4.6)
Phosphorus: 8.5 mg/dL — ABNORMAL HIGH (ref 2.5–4.6)
Phosphorus: 8.5 mg/dL — ABNORMAL HIGH (ref 2.5–4.6)

## 2022-04-06 LAB — GLUCOSE, CAPILLARY
Glucose-Capillary: 105 mg/dL — ABNORMAL HIGH (ref 70–99)
Glucose-Capillary: 103 mg/dL — ABNORMAL HIGH (ref 70–99)
Glucose-Capillary: 107 mg/dL — ABNORMAL HIGH (ref 70–99)
Glucose-Capillary: 129 mg/dL — ABNORMAL HIGH (ref 70–99)
Glucose-Capillary: 120 mg/dL — ABNORMAL HIGH (ref 70–99)
Glucose-Capillary: 114 mg/dL — ABNORMAL HIGH (ref 70–99)

## 2022-04-06 LAB — HEPARIN LEVEL (UNFRACTIONATED)
Heparin Unfractionated: 0.73 IU/mL — ABNORMAL HIGH (ref 0.30–0.70)
Heparin Unfractionated: 0.54 IU/mL (ref 0.30–0.70)

## 2022-04-06 LAB — MAGNESIUM
Magnesium: 2.6 mg/dL — ABNORMAL HIGH (ref 1.7–2.4)
Magnesium: 2.5 mg/dL — ABNORMAL HIGH (ref 1.7–2.4)
Magnesium: 2.5 mg/dL — ABNORMAL HIGH (ref 1.7–2.4)

## 2022-04-06 MED ORDER — LEVOTHYROXINE SODIUM 100 MCG PO TABS: 100.0000 ug | ORAL_TABLET | Freq: Every day | ORAL | Status: DC

## 2022-04-06 MED ORDER — LEVOTHYROXINE SODIUM 100 MCG PO TABS
100.0000 ug | ORAL_TABLET | Freq: Every day | ORAL | Status: DC
  Administered 2022-04-08 – 2022-04-11 (×4): 100 ug
  Filled 2022-04-06 (×4): qty 1

## 2022-04-06 MED ORDER — OSMOLITE 1.2 CAL PO LIQD
1000.0000 mL | ORAL | Status: DC
  Filled 2022-04-06: qty 1000

## 2022-04-06 MED ORDER — DEXMEDETOMIDINE HCL IN NACL 400 MCG/100ML IV SOLN
0.0000 ug/kg/h | INTRAVENOUS | Status: DC
  Administered 2022-04-06: 0.2 ug/kg/h via INTRAVENOUS
  Administered 2022-04-06: 0.8 ug/kg/h via INTRAVENOUS
  Administered 2022-04-07: 0.4 ug/kg/h via INTRAVENOUS
  Administered 2022-04-08: 0.5 ug/kg/h via INTRAVENOUS
  Administered 2022-04-08: 0.6 ug/kg/h via INTRAVENOUS
  Filled 2022-04-06 (×5): qty 100

## 2022-04-06 MED ORDER — HALOPERIDOL LACTATE 5 MG/ML IJ SOLN
1.0000 mg | Freq: Four times a day (QID) | INTRAMUSCULAR | Status: DC | PRN
  Administered 2022-04-06: 1 mg via INTRAVENOUS
  Filled 2022-04-06 (×2): qty 1

## 2022-04-06 MED ORDER — HYDROXYZINE HCL 10 MG PO TABS
10.0000 mg | ORAL_TABLET | Freq: Two times a day (BID) | ORAL | Status: DC
  Administered 2022-04-07 – 2022-04-11 (×9): 10 mg
  Filled 2022-04-06 (×12): qty 1

## 2022-04-06 MED ORDER — HYDROXYZINE HCL 10 MG PO TABS
10.0000 mg | ORAL_TABLET | Freq: Two times a day (BID) | ORAL | Status: DC
  Filled 2022-04-06: qty 1

## 2022-04-06 MED ORDER — SODIUM CHLORIDE 0.9 % IV BOLUS
500.0000 mL | Freq: Once | INTRAVENOUS | Status: AC
  Administered 2022-04-06: 500 mL via INTRAVENOUS

## 2022-04-06 MED ORDER — QUETIAPINE FUMARATE 100 MG PO TABS: 100.0000 mg | ORAL_TABLET | Freq: Three times a day (TID) | ORAL | Status: DC

## 2022-04-06 MED ORDER — HALOPERIDOL LACTATE 5 MG/ML IJ SOLN
1.0000 mg | Freq: Once | INTRAMUSCULAR | Status: AC
  Administered 2022-04-06: 1 mg via INTRAVENOUS
  Filled 2022-04-06: qty 1

## 2022-04-06 MED ORDER — PROSOURCE TF20 ENFIT COMPATIBL EN LIQD: 60.0000 mL | Freq: Every day | ENTERAL | Status: DC

## 2022-04-06 MED ORDER — ORAL CARE MOUTH RINSE
15.0000 mL | OROMUCOSAL | Status: DC
  Administered 2022-04-06 – 2022-04-09 (×8): 15 mL via OROMUCOSAL

## 2022-04-06 MED ORDER — SODIUM CHLORIDE 0.9 % IV SOLN
100.0000 mg | Freq: Two times a day (BID) | INTRAVENOUS | Status: DC
  Administered 2022-04-06 – 2022-04-07 (×4): 100 mg via INTRAVENOUS
  Filled 2022-04-06 (×5): qty 100

## 2022-04-06 MED ORDER — OLANZAPINE 5 MG PO TBDP
10.0000 mg | ORAL_TABLET | Freq: Every day | ORAL | Status: DC
  Filled 2022-04-06 (×2): qty 2

## 2022-04-06 MED ORDER — QUETIAPINE FUMARATE 100 MG PO TABS
100.0000 mg | ORAL_TABLET | Freq: Three times a day (TID) | ORAL | Status: DC
  Administered 2022-04-07 (×3): 100 mg
  Filled 2022-04-06 (×3): qty 1

## 2022-04-06 MED ORDER — VITAL HIGH PROTEIN PO LIQD: 1000.0000 mL | ORAL | Status: DC

## 2022-04-06 MED ORDER — ORAL CARE MOUTH RINSE: 15.0000 mL | OROMUCOSAL | Status: DC | PRN

## 2022-04-06 MED ORDER — LORAZEPAM 2 MG/ML IJ SOLN
1.0000 mg | Freq: Once | INTRAMUSCULAR | Status: AC
  Administered 2022-04-06: 1 mg via INTRAVENOUS
  Filled 2022-04-06: qty 1

## 2022-04-06 MED ORDER — DOXYCYCLINE HYCLATE 100 MG PO TABS: 100.0000 mg | ORAL_TABLET | Freq: Two times a day (BID) | ORAL | Status: DC

## 2022-04-06 MED ORDER — POLYETHYLENE GLYCOL 3350 17 G PO PACK: 17.0000 g | PACK | Freq: Every day | ORAL | Status: DC | PRN

## 2022-04-06 NOTE — Progress Notes (Signed)
  Echocardiogram 2D Echocardiogram attempted at 1555. Pt is refusing exam at this time. RN is aware. Will attempt again when the pt is more agreeable.  Eartha Inch 04/06/2022, 3:59 PM

## 2022-04-06 NOTE — Progress Notes (Signed)
NAME:  Bradley Tran, MRN:  [id number], DOB:  [birth year], LOS: 3 ADMISSION DATE:  03/26/2022, CONSULTATION DATE:  2/23 REFERRING MD:  Dr. Francia Greaves, CHIEF COMPLAINT: Acute respiratory failure with hypoxia  History of Present Illness:  Patient is a 72 year old male with pertinent PMH COPD, bipolar, DMT2, hypothyroidism, PE in nov 2022 no longer on ac presents to [facility] ED on 2/23 with acute respiratory failure with hypoxia.  Patient is homeless.  On 2/23 patient came to [facility] long ED for difficulty breathing.  Patient was belligerent and cussing at staff.  Patient was unwilling to have workup done and was sent home.  Later today patient was witnessed by bystanders having some increased SOB.  EMS called.  Patient tachypneic with sats 60-70% on room air.  Distant breath sounds.  Given breathing treatments, mag, and IV steroids.   Transferred to [facility] ED.  Upon arrival to [facility] ED patient still in respiratory distress.  Patient somnolent but arousable.  States he feels slightly better with breathing treatments.  States that a week ago he was diagnosed with COVID.  He has productive cough with bodyaches.  Vital stable.  Tmax 99.3 F.  VBG showing 7.15, 58, 145, 20.5.  Patient placed on BiPAP.  COVID-positive.  Cultures obtained.  CXR with no significant findings. Patient had episode of agitation ripping off bipap and pulling out iv's. Given ativan. PCCM consulted for icu admission.  Pertinent ED labs: WBC 7.1, Hgb 9, ethanol WNL, UDS and UA pending, creat 6.57 (baseline 4), BUN 79, GFR 9, troponin 9, bnp 158.  Pertinent  Medical History   Past Medical History:  Diagnosis Date   Acute renal failure ([place])    Bipolar disorder, unspecified ([place])    Diabetes mellitus    Hyperlipidemia    Hypothyroidism     Significant Hospital Events: Including procedures, antibiotic start and stop dates in addition to other pertinent events   2/23 admitted to [facility] resp failure on bipap 2/24 Intubated 2/26 No acute events  overnight, remains on vent 40 FIO2, low min ventilation on SBT trial this am  2/27 extubated successfully yesterday, agitated overnight requiring precedex and haldol  Interim History / Subjective:  Awake, calm interactive this morning on nasal cannula  Objective   Blood pressure 119/71, pulse 61, temperature 98.2 F (36.8 C), temperature source Axillary, resp. rate 13, weight 54.5 kg, SpO2 100 %.    Vent Mode: PSV;CPAP   Intake/Output Summary (Last 24 hours) at 04/06/2022 0911 Last data filed at 04/06/2022 0600 Gross per 24 hour  Intake 954.51 ml  Output 1600 ml  Net -645.49 ml    Filed Weights   04/04/22 0412 04/05/22 0400 04/06/22 0407  Weight: 54.7 kg 55.1 kg 54.5 kg    General:  chronically and acutely ill-appearing M, awake and in no distress HEENT: MM pink/moist, sclera anicteric Neuro: awake, following commands, oriented to person and place, speech intermittently slurred  CV: s1s2 rrr, no m/r/g PULM:  mildly reduced air entry in the bilateral bases, no distress or wheezing on nasal cannula GI: soft, non-distended  Extremities: warm/dry, no edema  Skin: no rashes or lesions    Resolved Hospital Problem list   Prolonged qtc  Assessment & Plan:  Acute respiratory failure with hypoxia/hypercarbia COPD exacerbation COVID positive P: -extubated 2/26, doing well on nasal cannula -prednisone decreased to '60mg'$ , continue today and plan to taper again tomorrow -remdesivir stopped  -continue doxy and Airborne precautions   Bipolar Disorder with Acute Agitation Pulled out IV's overnight -  on precedex and required restraints overnight -start home Seroquel at reduced dose -prn Haldol and wean precedex as able  Type 2 diabetes  -Hgb1c 6.5 P: -Continue SSI' -CBG goal 140-180 -continue to hold home long acting   Hx PE 12/2020  -Self discontinued anticoagulation 03/2021. Unclear if provoked or unprovoked Risk for VTE in setting of COVID P: -has been on heparin gtt  since arrival as unable to obtain CTA 2/2 renal function -will obtain echo and venous dopplers, if no evidence of clot or heart strain will plan to d/c heparin  AKI on CKD 4  -Creatinine 4.01 with GFR 9 04/05/21, creatinine 6.47 with GFR 9 on admit  P: -creatinine stabilized around 5.8 -still has adequate UOP -Monitor urine output, Trend Bmet, Avoid nephrotoxins -Ensure adequate renal perfusion   Hx of HTN/HLD  P: -Continuous telemetry    Anemia -Appears chronic Thrombocytopenia  P: -Trend CBC  -Transfuse per protocol  -Hgb goal > 7  Hypothyroidism -TSH 75 FT4 0.45 P: -Continue IV Synthroid     Best Practice (right click and "Reselect all SmartList Selections" daily)   Diet/type: NPO w/ oral meds, speech to see today DVT prophylaxis: prophylactic heparin  GI prophylaxis: N/A Lines: N/A Foley:  N/A Code Status:  full code Last date of multidisciplinary goals of care discussion [no contact on file]   Critical care time:  CRITICAL CARE Performed by: Otilio Carpen Ellianne Gowen  Total critical care time: 33 minutes  Critical care time was exclusive of separately billable procedures and treating other patients.  Critical care was necessary to treat or prevent imminent or life-threatening deterioration.  Critical care was time spent personally by me on the following activities: development of treatment plan with patient and/or surrogate as well as nursing, discussions with consultants, evaluation of patient's response to treatment, examination of patient, obtaining history from patient or surrogate, ordering and performing treatments and interventions, ordering and review of laboratory studies, ordering and review of radiographic studies, pulse oximetry and re-evaluation of patient's condition.  Otilio Carpen Kadir Azucena, PA-C [facility] See Amion for pager If no response to pager , please call [phone] until 7pm After 7:00 pm call Elink  [phone]?[place]

## 2022-04-06 NOTE — Progress Notes (Signed)
Nutrition Follow-up  DOCUMENTATION CODES:   Not applicable  INTERVENTION:   Plan for Cortrak tube tomorrow  Tube Feeding Recommendations via Cortrak: Osmolite 1.2 at 65 ml/hr This provides 87 g of protein, 1872 kcals, 1264 mL of free water  Recommend considering IV hydration, until pt with enteral access, for free water administration.     NUTRITION DIAGNOSIS:   Inadequate oral intake related to inability to eat as evidenced by NPO status.  Being addressed  GOAL:   Patient will meet greater than or equal to 90% of their needs  Progressing  MONITOR:   Vent status, Labs, Weight trends, TF tolerance, I & O's  REASON FOR ASSESSMENT:   Ventilator, Consult Enteral/tube feeding initiation and management (trickle tube feeding)  ASSESSMENT:   72 year old male who presented to the ED on 2/23 with SOB, recent COVID diagnosis. PMH of COPD, bipolar disorder, T2DM, HLD, hypothyroidism, PE, homelessness. Pt admitted with acute respiratory failure, AKI.  2/23 - Admitted on BiPAP 2/24 - Intubated 2/25 - TF started via Adult TF protocol 2/26 - Extubated, TF discontinued  Attempting to wean off precedex today  SLP evaluation today with recommendation of continued NPO status.  Dietitian Consult received for TF initiation and management. Pt currently without enteral access. RN attempted NGT placement but unsuccessful, plan for Cortrak tomorrow  Current wt 54.5 kg; admit weight 55.9 kg.   Noted renal function, BUN >100, hypernatremic-sodium worse, Phos elevated. Currently no access for free water administration UOP 1.8 L in 24 hours. No significant edema  Labs: sodium 149 (H), phosphorus 8.5 (H), BUN 132, Creatinine 5.88 Meds: ss novolog, solumedrol    Diet Order:   Diet Order     None       EDUCATION NEEDS:   No education needs have been identified at this time  Skin:  Skin Assessment: Reviewed RN Assessment  Last BM:  no documented BM  Height:   Ht Readings  from Last 1 Encounters:  03/17/2022 '5\' 4"'$  (1.626 m)    Weight:   Wt Readings from Last 1 Encounters:  04/06/22 54.5 kg    Ideal Body Weight:  59.1 kg  BMI:  Body mass index is 20.62 kg/m.  Estimated Nutritional Needs:   Kcal:  1800-2000  Protein:  85-100 grams  Fluid:  1.8-2.0 L   Kerman Passey MS, RDN, LDN, CNSC Registered Dietitian 3 Clinical Nutrition RD Pager and On-Call Pager Number Located in [place]

## 2022-04-06 NOTE — Progress Notes (Signed)
[place] for IV Heparin Indication:  hx of PE, prior apixaban but has stopped; now with acute resp distress and COVID and unable to obtain CTA  Allergies  Allergen Reactions   Budesonide-Formoterol Fumarate Swelling    [facility]   Trifluoperazine Other (See Comments)    Muscle aches    Patient Measurements: Weight: 54.5 kg (120 lb 2.4 oz) Heparin Dosing Weight: 46 kg  Vital Signs: Temp: 98.2 F (36.8 C) (02/27 0726) Temp Source: Axillary (02/27 0726) BP: 119/71 (02/27 0800) Pulse Rate: 61 (02/27 0808)  Labs: Recent Labs    04/04/22 0101 04/04/22 0351 04/04/22 1636 04/04/22 1946 04/05/22 0705 04/06/22 0301  HGB 7.1* 7.5*  --   --  7.8* 7.9*  HCT 22.4* 22.0*  --   --  25.0* 25.3*  PLT 157  --   --   --  149* 138*  HEPARINUNFRC 0.33  --   --  0.52 0.65 0.73*  CREATININE 6.04*  --  5.89*  --  5.80* 5.88*     Estimated Creatinine Clearance: 9.1 mL/min (A) (by C-G formula based on SCr of 5.88 mg/dL (H)).   Medical History: Past Medical History:  Diagnosis Date   Acute renal failure ([name])    Bipolar disorder, unspecified ([place])    Diabetes mellitus    Hyperlipidemia    Hypothyroidism     Medications:  Medications Prior to Admission  Medication Sig Dispense Refill Last Dose   apixaban (ELIQUIS) 5 MG TABS tablet Take 1 tablet (5 mg total) by mouth 2 (two) times daily. (Patient not taking: Reported on 04/04/2021) 60 tablet 1    atorvastatin (LIPITOR) 80 MG tablet Take 40 mg by mouth daily. (Patient not taking: Reported on 04/04/2021)      calcitRIOL (ROCALTROL) 0.25 MCG capsule Take 1 capsule by mouth daily. (Patient not taking: Reported on 04/04/2021)      Cholecalciferol 25 MCG (1000 UT) tablet Take 1,000 Units by mouth every morning.      hydrOXYzine (ATARAX) 10 MG tablet Take 10 mg by mouth 2 (two) times daily.      insulin glargine (LANTUS) 100 UNIT/ML injection Inject 20 Units into the skin daily  before breakfast.      insulin glargine-yfgn (SEMGLEE) 100 UNIT/ML injection Inject 20 Units into the skin daily. (Patient not taking: Reported on 04/04/2021)      levothyroxine (SYNTHROID) 100 MCG tablet Take 1 tablet (100 mcg total) by mouth daily. 30 tablet 2    methocarbamol (ROBAXIN) 500 MG tablet Take 1 tablet (500 mg total) by mouth 2 (two) times daily. 20 tablet 0    QUEtiapine (SEROQUEL) 200 MG tablet Take 200 mg by mouth in the morning, at noon, and at bedtime.      Tiotropium Bromide-Olodaterol 2.5-2.5 MCG/ACT AERS Inhale 2 puffs into the lungs daily. (Patient not taking: Reported on 04/04/2021)       Assessment: 72 years of age male with a history of PE in Nov 2022 prior on Apixaban but has since stopped who now presents in respiratory distress with COVID + and unable to obtain CTA. Pharmacy consulted for IV Heparin therapy for potential PE. Team to obtain ECHO, Dopplers.   -heparin level above goal (0.73) on 950 units/hr  Goal of Therapy:  Heparin level 0.3-0.7 units/ml Monitor platelets by anticoagulation protocol: Yes   Plan:  Reduce heparin gtt to 850 units/hr -8hr HL at 1700  Daily heparin level and CBC while on therapy.  Wilson Singer, PharmD Clinical Pharmacist 04/06/2022 8:48 AM

## 2022-04-06 NOTE — Evaluation (Signed)
Physical Therapy Evaluation Patient Details Name: Bradley Tran MRN: [id number] DOB: 1951-01-21 Today's Date: 04/06/2022  History of Present Illness  Patient is a 72 year old male presented [facility] ED on 2/23 with acute respiratory failure with hypoxia after having been d/c'd from [facility] earlier in the day when he would not allow workup. COVID+ Pt required ETT 2/24-2/26.Marland Kitchen Pt with pertinent PMH COPD, bipolar, DMT2, hypothyroidism, PE in nov 2022 no longer on ac.  Clinical Impression  Pt admitted with above diagnosis. Hx of homelessness. Min guard for bed mobility and min assist for sit<>stand transfers for balance. Mod assist with step -pivot transfer and pre-gait activity due to impaired cognition and heavy preference for posterior lean. Efforts focused on improved awareness of COB, and midline correction with reaching and weight shifting activities. May benefit from short term AIR placement to improve to mod I level. Pending progression medically and functionally - will continue to update as appropriate. SpO2 87-99% on 5L supplemental O2 during session.  Pt currently with functional limitations due to the deficits listed below (see PT Problem List). Pt will benefit from skilled PT to increase their independence and safety with mobility to allow discharge to the venue listed below.          Recommendations for follow up therapy are one component of a multi-disciplinary discharge planning process, led by the attending physician.  Recommendations may be updated based on patient status, additional functional criteria and insurance authorization.  Follow Up Recommendations Acute inpatient rehab (3hours/day) (Will monitor progress, update as appropriate)      Assistance Recommended at Discharge Frequent or constant Supervision/Assistance  Patient can return home with the following  A lot of help with walking and/or transfers;A lot of help with bathing/dressing/bathroom;Assistance with cooking/housework;Direct  supervision/assist for medications management;Direct supervision/assist for financial management;Assist for transportation;Help with stairs or ramp for entrance    Equipment Recommendations Other (comment) (TBD)  Recommendations for Other Services  Rehab consult    Functional Status Assessment Patient has had a recent decline in their functional status and demonstrates the ability to make significant improvements in function in a reasonable and predictable amount of time.     Precautions / Restrictions Precautions Precautions: Fall Precaution Comments: Monitor O2 Restrictions Weight Bearing Restrictions: No      Mobility  Bed Mobility Overal bed mobility: Needs Assistance Bed Mobility: Supine to Sit, Sit to Supine     Supine to sit: Min guard Sit to supine: Min guard   General bed mobility comments: Min guard for safety, Slow to rise, able to bring LEs out of bed. VC for sequencing back into bed. Able to reposition once in bed with cues.    Transfers Overall transfer level: Needs assistance Equipment used: 1 person hand held assist Transfers: Sit to/from Stand, Bed to chair/wheelchair/BSC Sit to Stand: Min assist   Step pivot transfers: Mod assist       General transfer comment: Min assist for boost to stand with some posterior LOB. Practiced pre gait activities, followed by pivot steps to recliner and back towards bed. Needed mod assist assist for balance due to posterior lean, poor foot placement.    Ambulation/Gait             Pre-gait activities: Pre-gait activities with stationary march, weight shifting, forward and backwards stepping. Required constant mod assist for balance, leaning back and towards Rt.    Stairs            Wheelchair Mobility    Modified Rankin (Stroke  Patients Only)       Balance Overall balance assessment: Needs assistance Sitting-balance support: No upper extremity supported, Feet supported Sitting balance-Leahy Scale:  Fair Sitting balance - Comments: Sat EOB for several minutes, weight shifting, progressed to reaching and hands on lap.   Standing balance support: Single extremity supported, During functional activity Standing balance-Leahy Scale: Poor                               Pertinent Vitals/Pain Pain Assessment Pain Assessment: CPOT Facial Expression: Relaxed, neutral Body Movements: Absence of movements Muscle Tension: Relaxed Compliance with ventilator (intubated pts.): N/A Vocalization (extubated pts.): Talking in normal tone or no sound CPOT Total: 0 Pain Intervention(s): Monitored during session    Home Living Family/patient expects to be discharged to:: Unsure     Type of Home: Homeless             Additional Comments: limited information from pt this date.    Prior Function Prior Level of Function : Patient poor historian/Family not available                     Hand Dominance        Extremity/Trunk Assessment   Upper Extremity Assessment Upper Extremity Assessment: Defer to OT evaluation    Lower Extremity Assessment Lower Extremity Assessment: Generalized weakness;Difficult to assess due to impaired cognition       Communication   Communication: Other (comment) (Low voice today)  Cognition Arousal/Alertness: Lethargic, Suspect due to medications Behavior During Therapy: WFL for tasks assessed/performed Overall Cognitive Status: Difficult to assess                                 General Comments: Minimal response to questions. Will respond with 1 word statemens "Bathroom", but when asked if he needs to use the [facility] pt states "No".  Following commands inconsistently. Will stand when asked to stand, but attempts to move beyond requested tasks with decreased awareness of deficits and safety.        General Comments General comments (skin integrity, edema, etc.): SpO2 99% on 5L, down to 87% while standing however  questionable reading from sensory. HR 60, BP 121/69, RR 11.    Exercises     Assessment/Plan    PT Assessment Patient needs continued PT services  PT Problem List Decreased strength;Decreased activity tolerance;Decreased balance;Decreased mobility;Decreased coordination;Decreased cognition;Decreased knowledge of use of DME;Decreased safety awareness;Decreased knowledge of precautions;Cardiopulmonary status limiting activity       PT Treatment Interventions DME instruction;Gait training;Stair training;Functional mobility training;Therapeutic activities;Therapeutic exercise;Balance training;Neuromuscular re-education;Cognitive remediation;Patient/family education    PT Goals (Current goals can be found in the Care Plan section)  Acute Rehab PT Goals Patient Stated Goal: none stated PT Goal Formulation: Patient unable to participate in goal setting Time For Goal Achievement: 04/20/22 Potential to Achieve Goals: Good    Frequency Min 3X/week     Co-evaluation               AM-PAC PT "6 Clicks" Mobility  Outcome Measure Help needed turning from your back to your side while in a flat bed without using bedrails?: A Little Help needed moving from lying on your back to sitting on the side of a flat bed without using bedrails?: A Little Help needed moving to and from a bed to a chair (including a wheelchair)?:  A Lot Help needed standing up from a chair using your arms (e.g., wheelchair or bedside chair)?: A Lot Help needed to walk in hospital room?: A Lot Help needed climbing 3-5 steps with a railing? : A Lot 6 Click Score: 14    End of Session Equipment Utilized During Treatment: Gait belt;Oxygen Activity Tolerance: Patient tolerated treatment well Patient left: in bed;with call bell/phone within reach;with bed alarm set;Other (comment) (Restrains reapplied) Nurse Communication: Mobility status PT Visit Diagnosis: Unsteadiness on feet (R26.81);Other abnormalities of gait and  mobility (R26.89);Muscle weakness (generalized) (M62.81);Difficulty in walking, not elsewhere classified (R26.2)    Time: [id number] PT Time Calculation (min) (ACUTE ONLY): 22 min   Charges:   PT Evaluation $PT Eval Moderate Complexity: 1 Mod          Candie Mile, PT, DPT Physical Therapist Acute Rehabilitation Services [facility]   Ellouise Newer 04/06/2022, 1:32 PM

## 2022-04-06 NOTE — Progress Notes (Signed)
[place] for IV Heparin Indication:  hx of PE, prior apixaban but has stopped; now with acute resp distress and COVID and unable to obtain CTA  Allergies  Allergen Reactions   Budesonide-Formoterol Fumarate Swelling    [facility]   Trifluoperazine Other (See Comments)    Muscle aches    Patient Measurements: Weight: 54.5 kg (120 lb 2.4 oz) Heparin Dosing Weight: 46 kg  Vital Signs: Temp: 97.9 F (36.6 C) (02/27 1535) Temp Source: Axillary (02/27 1535) BP: 103/60 (02/27 1800) Pulse Rate: 54 (02/27 1800)  Labs: Recent Labs    04/04/22 0101 04/04/22 0351 04/04/22 1636 04/04/22 1946 04/05/22 0705 04/06/22 0301 04/06/22 1631  HGB 7.1* 7.5*  --   --  7.8* 7.9*  --   HCT 22.4* 22.0*  --   --  25.0* 25.3*  --   PLT 157  --   --   --  149* 138*  --   HEPARINUNFRC 0.33  --   --    < > 0.65 0.73* 0.54  CREATININE 6.04*  --  5.89*  --  5.80* 5.88*  --    < > = values in this interval not displayed.     Estimated Creatinine Clearance: 9.1 mL/min (A) (by C-G formula based on SCr of 5.88 mg/dL (H)).   Medical History: Past Medical History:  Diagnosis Date   Acute renal failure ([name])    Bipolar disorder, unspecified ([place])    Diabetes mellitus    Hyperlipidemia    Hypothyroidism     Medications:  Medications Prior to Admission  Medication Sig Dispense Refill Last Dose   apixaban (ELIQUIS) 5 MG TABS tablet Take 1 tablet (5 mg total) by mouth 2 (two) times daily. (Patient not taking: Reported on 04/04/2021) 60 tablet 1    atorvastatin (LIPITOR) 80 MG tablet Take 40 mg by mouth daily. (Patient not taking: Reported on 04/04/2021)      calcitRIOL (ROCALTROL) 0.25 MCG capsule Take 1 capsule by mouth daily. (Patient not taking: Reported on 04/04/2021)      Cholecalciferol 25 MCG (1000 UT) tablet Take 1,000 Units by mouth every morning.      hydrOXYzine (ATARAX) 10 MG tablet Take 10 mg by mouth 2 (two) times daily.       insulin glargine (LANTUS) 100 UNIT/ML injection Inject 20 Units into the skin daily before breakfast.      insulin glargine-yfgn (SEMGLEE) 100 UNIT/ML injection Inject 20 Units into the skin daily. (Patient not taking: Reported on 04/04/2021)      levothyroxine (SYNTHROID) 100 MCG tablet Take 1 tablet (100 mcg total) by mouth daily. 30 tablet 2    methocarbamol (ROBAXIN) 500 MG tablet Take 1 tablet (500 mg total) by mouth 2 (two) times daily. 20 tablet 0    QUEtiapine (SEROQUEL) 200 MG tablet Take 200 mg by mouth in the morning, at noon, and at bedtime.      Tiotropium Bromide-Olodaterol 2.5-2.5 MCG/ACT AERS Inhale 2 puffs into the lungs daily. (Patient not taking: Reported on 04/04/2021)       Assessment: 72 years of age male with a history of PE in Nov 2022 prior on Apixaban but has since stopped who now presents in respiratory distress with COVID + and unable to obtain CTA. Pharmacy consulted for IV Heparin therapy for potential PE. Team to obtain ECHO, Dopplers.   Repeat heparin level is therapeutic at 0.54.  Goal of Therapy:  Heparin level 0.3-0.7 units/ml Monitor  platelets by anticoagulation protocol: Yes   Plan:  Continue heparin 850 units/h Daily heparin level and CBC  Arrie Senate, PharmD, [place], [facility] Clinical Pharmacist [phone] Please check AMION for all [employer] numbers 04/06/2022

## 2022-04-06 NOTE — Progress Notes (Signed)
[place] Progress Note Patient Name: Bradley Tran DOB: 04-03-1950 MRN: [id number]   Date of Service  04/06/2022  HPI/Events of Note  Agitation - Patient pulled off EKG leads and pulled out IV. QTc interval = 0.45 seconds.   eICU Interventions  Plan: Increase ceiling on Precedex IV infusion to 1 mcg/kg/hour. Haldol 1 mg IV X 1. Bilateral soft wrist restraints X 3 hours.     Intervention Category Major Interventions: Delirium, psychosis, severe agitation - evaluation and management  Tenzin Pavon [place] 04/06/2022, 6:45 AM

## 2022-04-06 NOTE — Progress Notes (Signed)
Inpatient Rehab Admissions Coordinator:   Per therapy recommendations, patient was screened for CIR candidacy by Clemens Catholic, MS, CCC-SLP. At this time, Pt. does not appear to demonstrate medical necessity to justify in hospital rehabilitation/CIR. will not pursue a rehab consult for this Pt.  Additionally, insurance is unlikely to approve CIR for this dx and disposition is unclear as pt. Is unhoused.  Recommend other rehab venues to be pursued.  Please contact me with any questions.   Clemens Catholic, [place], [place] Admissions Coordinator  [phone] ([place]) [phone] (office)

## 2022-04-06 NOTE — Progress Notes (Signed)
Attending:    Subjective: Extubated overnight In restraints On precedex for agitation Respiratory status is OK Prn haldol given  Objective: Vitals:   04/06/22 0407 04/06/22 0726 04/06/22 0800 04/06/22 0808  BP:   119/71   Pulse:   66 61  Resp:   10 13  Temp:  98.2 F (36.8 C)    TempSrc:  Axillary    SpO2:   94% 100%  Weight: 54.5 kg      Vent Mode: PSV;CPAP FiO2 (%):  [40 %] 40 % Set Rate:  [28 bmp] 28 bmp Vt Set:  [470 mL] 470 mL PEEP:  [5 cmH20] 5 cmH20 Plateau Pressure:  [21 cmH20] 21 cmH20  Intake/Output Summary (Last 24 hours) at 04/06/2022 0842 Last data filed at 04/06/2022 0600 Gross per 24 hour  Intake 1060.07 ml  Output 1600 ml  Net -539.93 ml    General:  Resting comfortably in bed HENT: NCAT OP clear PULM: diminished breath sounds, few rhonchi left greater that right, normal effort CV: RRR, no mgr GI: BS+, soft, nontender MSK: normal bulk and tone Neuro: drowsy but wakes up and speaks to me clearly, MAEW    CBC    Component Value Date/Time   WBC 8.8 04/06/2022 0301   RBC 2.52 (L) 04/06/2022 0301   HGB 7.9 (L) 04/06/2022 0301   HCT 25.3 (L) 04/06/2022 0301   PLT 138 (L) 04/06/2022 0301   MCV 100.4 (H) 04/06/2022 0301   MCH 31.3 04/06/2022 0301   MCHC 31.2 04/06/2022 0301   RDW 14.5 04/06/2022 0301   LYMPHSABS 1.5 04/07/2022 2136   MONOABS 1.3 (H) 03/16/2022 2136   EOSABS 0.5 04/01/2022 2136   BASOSABS 0.0 03/31/2022 2136    BMET    Component Value Date/Time   NA 149 (H) 04/06/2022 0301   K 4.7 04/06/2022 0301   CL 111 04/06/2022 0301   CO2 19 (L) 04/06/2022 0301   GLUCOSE 105 (H) 04/06/2022 0301   BUN 132 (H) 04/06/2022 0301   CREATININE 5.88 (H) 04/06/2022 0301   CALCIUM 8.4 (L) 04/06/2022 0301   GFRNONAA 10 (L) 04/06/2022 0301   GFRAA 46 (L) 02/09/2011 0128    None new  Impression/Plan: Remains critically ill due to: ICU delirium> frequent orientation, wean off precedex, start home seroquel give prn haldol, lights on  daytime/off night Acute on chronic CKD, non-oliguric> Monitor BMET and UOP Replace electrolytes as needed COPD exacerbation from COVID > continue brovana/yupelri, wean down     prednisone this week Hypernatremia> encourage PO intake today Aspiration risk> start  Concern for pulmonary embolism on admission, history of PE> overall likelihood of PE seems low at this point, but we have not worked him up for clot yet.  Continue heparin infusion, check lower ext dopplers for now.    My cc time 31 minutes  Roselie Awkward, MD [place] [facility] Pager: [phone] Cell: [phone] After 7pm: [phone]

## 2022-04-06 NOTE — Plan of Care (Signed)
  Problem: Education: Goal: Knowledge of risk factors and measures for prevention of condition will improve Outcome: Not Progressing   Problem: Coping: Goal: Psychosocial and spiritual needs will be supported Outcome: Not Progressing   Problem: Respiratory: Goal: Will maintain a patent airway Outcome: Not Progressing Goal: Complications related to the disease process, condition or treatment will be avoided or minimized Outcome: Not Progressing   Problem: Education: Goal: Ability to describe self-care measures that may prevent or decrease complications (Diabetes Survival Skills Education) will improve Outcome: Not Progressing Goal: Individualized Educational Video(s) Outcome: Not Progressing

## 2022-04-06 NOTE — Progress Notes (Signed)
Bilateral lower extremity venous study completed.   Preliminary results relayed to Myriam Jacobson, RN  Please see CV Procedures for preliminary results.  Eliora Nienhuis, RVT  2:05 PM 04/06/22

## 2022-04-06 NOTE — Progress Notes (Addendum)
[place] for IV Heparin Indication:  hx of PE, prior apixaban but has stopped; now with acute resp distress and COVID and unable to obtain CTA  Allergies  Allergen Reactions   Budesonide-Formoterol Fumarate Swelling    [facility]   Trifluoperazine Other (See Comments)    Muscle aches    Patient Measurements: Weight: 54.5 kg (120 lb 2.4 oz) Heparin Dosing Weight: 46 kg  Vital Signs: Temp: 98.4 F (36.9 C) (02/27 0349) Temp Source: Oral (02/27 0349) BP: 117/74 (02/27 0100) Pulse Rate: 67 (02/27 0100)  Labs: Recent Labs    04/04/22 0101 04/04/22 0351 04/04/22 1636 04/04/22 1946 04/05/22 0705 04/06/22 0301  HGB 7.1* 7.5*  --   --  7.8* 7.9*  HCT 22.4* 22.0*  --   --  25.0* 25.3*  PLT 157  --   --   --  149* 138*  HEPARINUNFRC 0.33  --   --  0.52 0.65 0.73*  CREATININE 6.04*  --  5.89*  --  5.80* 5.88*     Estimated Creatinine Clearance: 9.1 mL/min (A) (by C-G formula based on SCr of 5.88 mg/dL (H)).   Medical History: Past Medical History:  Diagnosis Date   Acute renal failure ([name])    Bipolar disorder, unspecified ([place])    Diabetes mellitus    Hyperlipidemia    Hypothyroidism     Medications:  Medications Prior to Admission  Medication Sig Dispense Refill Last Dose   apixaban (ELIQUIS) 5 MG TABS tablet Take 1 tablet (5 mg total) by mouth 2 (two) times daily. (Patient not taking: Reported on 04/04/2021) 60 tablet 1    atorvastatin (LIPITOR) 80 MG tablet Take 40 mg by mouth daily. (Patient not taking: Reported on 04/04/2021)      calcitRIOL (ROCALTROL) 0.25 MCG capsule Take 1 capsule by mouth daily. (Patient not taking: Reported on 04/04/2021)      Cholecalciferol 25 MCG (1000 UT) tablet Take 1,000 Units by mouth every morning.      hydrOXYzine (ATARAX) 10 MG tablet Take 10 mg by mouth 2 (two) times daily.      insulin glargine (LANTUS) 100 UNIT/ML injection Inject 20 Units into the skin daily before  breakfast.      insulin glargine-yfgn (SEMGLEE) 100 UNIT/ML injection Inject 20 Units into the skin daily. (Patient not taking: Reported on 04/04/2021)      levothyroxine (SYNTHROID) 100 MCG tablet Take 1 tablet (100 mcg total) by mouth daily. 30 tablet 2    methocarbamol (ROBAXIN) 500 MG tablet Take 1 tablet (500 mg total) by mouth 2 (two) times daily. 20 tablet 0    QUEtiapine (SEROQUEL) 200 MG tablet Take 200 mg by mouth in the morning, at noon, and at bedtime.      Tiotropium Bromide-Olodaterol 2.5-2.5 MCG/ACT AERS Inhale 2 puffs into the lungs daily. (Patient not taking: Reported on 04/04/2021)       Assessment: 72 years of age male with a history of PE in Nov 2022 prior on Apixaban but has since stopped who now presents in respiratory distress with COVID + and unable to obtain CTA. Pharmacy consulted for IV Heparin therapy for potential PE.   -heparin level above goal (0.73) on 950 units/hr  Goal of Therapy:  Heparin level 0.3-0.7 units/ml Monitor platelets by anticoagulation protocol: Yes   Plan:  Reduce heparin gtt to 850 units/hr -pending anticoag plans for level planning Daily heparin level and CBC while on therapy.   Wilson Singer,  PharmD Clinical Pharmacist 04/06/2022 7:25 AM

## 2022-04-06 NOTE — Evaluation (Signed)
Clinical/Bedside Swallow Evaluation Patient Details  Name: Bradley Tran MRN: [id number] Date of Birth: 1950/08/29  Today's Date: 04/06/2022 Time: SLP Start Time (ACUTE ONLY): 1002 SLP Stop Time (ACUTE ONLY): 1014 SLP Time Calculation (min) (ACUTE ONLY): 12 min  Past Medical History:  Past Medical History:  Diagnosis Date   Acute renal failure ([place])    Bipolar disorder, unspecified ([place])    Diabetes mellitus    Hyperlipidemia    Hypothyroidism    Past Surgical History:  Past Surgical History:  Procedure Laterality Date   OTHER SURGICAL HISTORY     lymph node removed from right arm   HPI:  Patient is a 72 year old male presented to [facility] ED on 2/23 with acute respiratory failure with hypoxia after having been [facility] earlier in the day when he would not allow workup. COVID+  CXR 2/23 and 2/24 without acute findings.  Pt required ETT 2/24-2/26, just under 48 hours. Pt with pertinent PMH COPD, bipolar, DMT2, hypothyroidism, PE in nov 2022 no longer on ac.    Assessment / Plan / Recommendation  Clinical Impression  Pt presents with a moderate oral dysphagia and clinical indicators of pharyngeal dysphagia.  Pt was drowsy today and unable to follow all directions for OME.  Pt did not vocalize today.  Pt had difficulty maintaining tongue within oral cavity, ?macroglossia, edema, or intattention.  Unable to lateralize tongue to instruction. With tactile cuing/assistance, pt withdrew tongue back into oral cavity.  Pt with very poor attention to bolus trials.  There was no oral manipulation of ice chip after initial acceptance and active retrieval from spoon.  There was anterior loss and no pharyngeal swallow reflex was palpated.  There was complete anterior loss of initial trial of thin liquid by spoon.  Pt tolerated 2nd trial by spoon with no overt s/s of aspiration.  Pt was unable to siphon from straw.  With cup sip of thin liquid, there were multiple swallows and immediate, weak, wet cough  which persisted.  Pt did not exhibit oral response to puree and bolus was removed via suction.   Recommend pt remain NPO at this time with alternate means of nutrition, hydration, and medication. Pt may have small amounts of water by teaspoon in moderation, after good oral care, when fully awake/alert, with upright positioning and 1:1 assistance.  SLP Visit Diagnosis: Dysphagia, unspecified (R13.10)    Aspiration Risk  Moderate aspiration risk    Diet Recommendation NPO;Alternative means - temporary   Medication Administration: Via alternative means    Other  Recommendations Oral Care Recommendations: Oral care QID;Oral care prior to ice chip/H20    Recommendations for follow up therapy are one component of a multi-disciplinary discharge planning process, led by the attending physician.  Recommendations may be updated based on patient status, additional functional criteria and insurance authorization.  Follow up Recommendations  (TBD)      Assistance Recommended at Discharge  N/A  Functional Status Assessment Patient has had a recent decline in their functional status and demonstrates the ability to make significant improvements in function in a reasonable and predictable amount of time.  Frequency and Duration min 2x/week  2 weeks       Prognosis Prognosis for improved oropharyngeal function: Good      Swallow Study   General Date of Onset: 03/31/2022 HPI: Patient is a 72 year old male presented to [facility] ED on 2/23 with acute respiratory failure with hypoxia after having been [facility] earlier in the day when he  would not allow workup. COVID+  CXR 2/23 and 2/24 without acute findings.  Pt required ETT 2/24-2/26, just under 48 hours. Pt with pertinent PMH COPD, bipolar, DMT2, hypothyroidism, PE in nov 2022 no longer on ac. Type of Study: Bedside Swallow Evaluation Previous Swallow Assessment: None Diet Prior to this Study: NPO Temperature Spikes Noted: No Respiratory Status:  Nasal cannula History of Recent Intubation: Yes Total duration of intubation (days): 2 days Date extubated: 04/05/22 Behavior/Cognition: Lethargic/Drowsy;Doesn't follow directions Oral Cavity Assessment: Dry Oral Care Completed by SLP: No Self-Feeding Abilities: Total assist Patient Positioning: Upright in bed Baseline Vocal Quality: Not observed Volitional Cough: Cognitively unable to elicit Volitional Swallow: Unable to elicit    Oral/Motor/Sensory Function Overall Oral Motor/Sensory Function: Mild impairment Facial ROM: Within Functional Limits Facial Symmetry: Within Functional Limits Lingual ROM: Reduced right;Reduced left Lingual Symmetry: Within Functional Limits Lingual Strength: Reduced Velum: Within Functional Limits Mandible: Within Functional Limits   Ice Chips Ice chips: Impaired Oral Phase Impairments: Poor awareness of bolus Oral Phase Functional Implications: Oral holding;Left anterior spillage   Thin Liquid Thin Liquid: Impaired Presentation: Cup;Spoon Oral Phase Functional Implications: Left anterior spillage;Right anterior spillage Pharyngeal  Phase Impairments: Cough - Immediate;Wet Vocal Quality    Nectar Thick Nectar Thick Liquid: Not tested   Honey Thick Honey Thick Liquid: Not tested   Puree Puree: Impaired Oral Phase Impairments: Poor awareness of bolus Oral Phase Functional Implications: Oral holding   Solid     Solid: Not tested      Celedonio Savage, MA, [place] Office: [phone] 04/06/2022,10:37 AM

## 2022-04-07 ENCOUNTER — Inpatient Hospital Stay (HOSPITAL_COMMUNITY): Payer: Medicare Other

## 2022-04-07 DIAGNOSIS — J9601 Acute respiratory failure with hypoxia: Secondary | ICD-10-CM

## 2022-04-07 LAB — GLUCOSE, CAPILLARY
Glucose-Capillary: 113 mg/dL — ABNORMAL HIGH (ref 70–99)
Glucose-Capillary: 97 mg/dL (ref 70–99)
Glucose-Capillary: 141 mg/dL — ABNORMAL HIGH (ref 70–99)
Glucose-Capillary: 123 mg/dL — ABNORMAL HIGH (ref 70–99)
Glucose-Capillary: 116 mg/dL — ABNORMAL HIGH (ref 70–99)
Glucose-Capillary: 177 mg/dL — ABNORMAL HIGH (ref 70–99)

## 2022-04-07 LAB — ECHOCARDIOGRAM COMPLETE
Height: 64 in
S' Lateral: 2.8 cm
Weight: 1894.19 oz

## 2022-04-07 MED ORDER — HEPARIN SODIUM (PORCINE) 5000 UNIT/ML IJ SOLN
5000.0000 [IU] | Freq: Three times a day (TID) | INTRAMUSCULAR | Status: DC
  Administered 2022-04-07 – 2022-04-11 (×12): 5000 [IU] via SUBCUTANEOUS
  Filled 2022-04-07 (×12): qty 1

## 2022-04-07 MED ORDER — POLYETHYLENE GLYCOL 3350 17 G PO PACK
17.0000 g | PACK | Freq: Every day | ORAL | Status: DC
  Filled 2022-04-07: qty 1

## 2022-04-07 MED ORDER — METHYLPREDNISOLONE SODIUM SUCC 40 MG IJ SOLR: 20.0000 mg | Freq: Every day | INTRAMUSCULAR | Status: DC

## 2022-04-07 MED ORDER — THIAMINE MONONITRATE 100 MG PO TABS
100.0000 mg | ORAL_TABLET | Freq: Every day | ORAL | Status: AC
  Administered 2022-04-07 – 2022-04-11 (×5): 100 mg
  Filled 2022-04-07 (×5): qty 1

## 2022-04-07 MED ORDER — METHYLPREDNISOLONE SODIUM SUCC 40 MG IJ SOLR
40.0000 mg | Freq: Every day | INTRAMUSCULAR | Status: DC
  Filled 2022-04-07: qty 1

## 2022-04-07 MED ORDER — METHYLPREDNISOLONE SODIUM SUCC 40 MG IJ SOLR: 40.0000 mg | Freq: Every day | INTRAMUSCULAR | Status: DC

## 2022-04-07 MED ORDER — SENNA 8.6 MG PO TABS
1.0000 | ORAL_TABLET | Freq: Every day | ORAL | Status: DC
  Administered 2022-04-07 – 2022-04-09 (×3): 8.6 mg
  Filled 2022-04-07 (×3): qty 1

## 2022-04-07 MED ORDER — POLYETHYLENE GLYCOL 3350 17 G PO PACK
17.0000 g | PACK | Freq: Every day | ORAL | Status: DC
  Administered 2022-04-07 – 2022-04-09 (×3): 17 g via ORAL
  Filled 2022-04-07 (×2): qty 1

## 2022-04-07 MED ORDER — OSMOLITE 1.2 CAL PO LIQD
1000.0000 mL | ORAL | Status: DC
  Administered 2022-04-07 – 2022-04-10 (×4): 1000 mL
  Filled 2022-04-07 (×5): qty 1000
  Filled 2022-04-07: qty 1185
  Filled 2022-04-07 (×2): qty 1000

## 2022-04-07 NOTE — Progress Notes (Signed)
Pt becoming more violent kicking at staff.  Called elink to advise.  Per MD, ok to increase precedex gtt with low HR.

## 2022-04-07 NOTE — Progress Notes (Signed)
Nutrition Follow-up  DOCUMENTATION CODES:   Severe malnutrition in context of chronic illness  INTERVENTION:  Initiate tube feeds via Cortrak: -Initiate Osmolite 1.2 at 30 mL/hour and advance by 10 mL/hour every 8 hours to goal rate of 65 mL/hour (1560 mL goal daily volume) -Provides: 1872 kcal, 87 grams of protein, 1279 mL H2O daily  Monitor magnesium, potassium, and phosphorus BID for at least 4 occurrences, MD to replete as needed, as pt is at risk for refeeding syndrome.  Provide thiamine 100 mg per tube daily x 5 days in setting of risk for refeeding syndrome.  NUTRITION DIAGNOSIS:   Severe Malnutrition related to chronic illness (COPD, also suspect component of social/environmental circumstances impacting nutrition status in setting of homelessness prior to admission) as evidenced by severe fat depletion, severe muscle depletion.  New nutrition diagnosis.  GOAL:   Patient will meet greater than or equal to 90% of their needs  Progressing with resumption of tube feeds today.  MONITOR:   PO intake, Diet advancement, Labs, Weight trends, TF tolerance, I & O's  REASON FOR ASSESSMENT:   Ventilator, Consult Enteral/tube feeding initiation and management (trickle tube feeding)  ASSESSMENT:   72 year old male who presented to the ED on 2/23 with SOB, recent COVID diagnosis. PMH of COPD, bipolar disorder, T2DM, HLD, hypothyroidism, PE, homelessness. Pt admitted with acute respiratory failure, AKI.  2/23 - Admitted on BiPAP 2/24 - Intubated 2/25 - TF started via Adult TF protocol 2/26 - Extubated, TF discontinued 2/28 - s/p SLP evaluation and diet advanced to dysphagia 1 with thin liquids; Cortrak tube placed  Plan for Cortrak tube placement today. Diet was advanced to dysphagia 1 with thin liquids following SLP evaluation today. Plan is still to place Cortrak tube for initiation of enteral nutrition in setting of patient's nutritional status. Findings on NFPE completed  today are indicative of severe malnutrition. Suspect may be related to both chronic illness (COPD) and also possibly social/environmental circumstances as pt with homelessness PTA. Suspect pt may be at risk for refeeding syndrome with initiation of tube feeds. Will plan to initiate at 30 mL/hour and advance towards goal.  Admission wt 55.9 kg (123.24 lbs). Current wt 53.7 kg (118.39 lbs). Will continue to monitor trends.  Enteral Access: 10 Fr. Cortrak tube placed 2/28; 70 cm in right nare secured with bridle; terminates in distal stomach per abdominal x-[place] 2/28  Medications reviewed and include: Novolog 0-15 units Q4hrs, levothyroxine, Miralax, senna 1 tablet daily, Precedex gtt, doxycycline  Labs reviewed: CBG 97-141, Phosphorus 8.5, Magnesium 2.5 No BMP checked today  UOP: 1850 mL (1.4 mL/kg/hr)  I/O: +1569 mL since admission  Discussed with RN.   NUTRITION - FOCUSED PHYSICAL EXAM:  Flowsheet Row Most Recent Value  Orbital Region Moderate depletion  Upper Arm Region Severe depletion  Thoracic and Lumbar Region Severe depletion  Buccal Region Moderate depletion  Temple Region Moderate depletion  Clavicle Bone Region Severe depletion  Clavicle and Acromion Bone Region Severe depletion  Scapular Bone Region Severe depletion  Dorsal Hand Unable to assess  [mitts and restraints]  Patellar Region Severe depletion  Anterior Thigh Region Severe depletion  Posterior Calf Region Severe depletion  Edema (RD Assessment) None  Hair Reviewed  Eyes Reviewed  Mouth Unable to assess  [patient combative]  Skin Reviewed  Nails Reviewed      Nutrition-focused physical exam was completed by RD placing Cortrak tube today.  Diet Order:   Diet Order  DIET - DYS 1 Room service appropriate? No; Fluid consistency: Thin  Diet effective now                  EDUCATION NEEDS:   Not appropriate for education at this time  Skin:  Skin Assessment: Reviewed RN  Assessment  Last BM:  PTA  Height:   Ht Readings from Last 1 Encounters:  03/23/2022 '5\' 4"'$  (1.626 m)   Weight:   Wt Readings from Last 1 Encounters:  04/07/22 53.7 kg   Ideal Body Weight:  59.1 kg  BMI:  Body mass index is 20.32 kg/m.  Estimated Nutritional Needs:   Kcal:  1800-2000  Protein:  85-100 grams  Fluid:  1.8-2.0 L  Bradley Sotelo Magda Paganini, MS, RD, LDN, CNSC Pager number available on Amion
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
## 2022-04-07 NOTE — Procedures (Signed)
Cortrak  Person Inserting Tube:  Idelia Salm, RD Tube Type:  Cortrak - 43 inches Tube Size:  10 Tube Location:  Right nare Secured by: Bridle Technique Used to Measure Tube Placement:  Marking at nare/corner of mouth Cortrak Secured At:  70 cm   Cortrak Tube Team Note:  Consult received to place a Cortrak feeding tube.   X-Lungren is required, abdominal x-Vaden has been ordered by the Cortrak team. Please confirm tube placement before using the Cortrak tube.   If the tube becomes dislodged please keep the tube and contact the Cortrak team at [email or web address] for replacement.  If after hours and replacement cannot be delayed, place a NG tube and confirm placement with an abdominal x-[place].    Gustavus Bryant, MS, RD, LDN Inpatient Clinical Dietitian Please see AMiON for contact information.

## 2022-04-07 NOTE — Progress Notes (Signed)
Attending:    Subjective: Severe agitation Failed SLP evaluation  Needs cor trak Remains on precedex  Objective: Vitals:   04/07/22 0700 04/07/22 0722 04/07/22 0730 04/07/22 0805  BP: (!) 148/78   122/69  Pulse: 69  (!) 58 69  Resp: '14  14 16  '$ Temp:  97.9 F (36.6 C)    TempSrc:  Axillary    SpO2: 100%  100% 100%  Weight:          Intake/Output Summary (Last 24 hours) at 04/07/2022 1001 Last data filed at 04/07/2022 0730 Gross per 24 hour  Intake 1217.01 ml  Output 2125 ml  Net -907.99 ml    General:  Resting comfortably in bed HENT: NCAT OP clear PULM: CTA B, normal effort CV: RRR, no mgr GI: BS+, soft, nontender MSK: normal bulk and tone Neuro: awake, alert, no distress, MAEW    CBC    Component Value Date/Time   WBC 8.8 04/06/2022 0301   RBC 2.52 (L) 04/06/2022 0301   HGB 7.9 (L) 04/06/2022 0301   HCT 25.3 (L) 04/06/2022 0301   PLT 138 (L) 04/06/2022 0301   MCV 100.4 (H) 04/06/2022 0301   MCH 31.3 04/06/2022 0301   MCHC 31.2 04/06/2022 0301   RDW 14.5 04/06/2022 0301   LYMPHSABS 1.5 03/12/2022 2136   MONOABS 1.3 (H) 03/11/2022 2136   EOSABS 0.5 03/31/2022 2136   BASOSABS 0.0 03/23/2022 2136    BMET    Component Value Date/Time   NA 149 (H) 04/06/2022 0301   K 4.7 04/06/2022 0301   CL 111 04/06/2022 0301   CO2 19 (L) 04/06/2022 0301   GLUCOSE 105 (H) 04/06/2022 0301   BUN 132 (H) 04/06/2022 0301   CREATININE 5.88 (H) 04/06/2022 0301   CALCIUM 8.4 (L) 04/06/2022 0301   GFRNONAA 10 (L) 04/06/2022 0301   GFRAA 46 (L) 02/09/2011 0128     Impression/Plan: Remains critically ill due to: Acute metabolic encephalopathy: improving> stop zyprexa, change to seroquel '100mg'$  po tid, could increase to '200mg'$  tid as behavior changes, wean off precedex Bipolar > seroquel as above COVID induced COPD exacerbation > d/c steroids, continue brovana/yupelri; f/u with [facility] pulmonary (has seen them before) Emphysema> he'll need Anoro on discharge, prn albuterol;  will change duoneb to prn here Synthroid > synthroid  If off precedex can move out of ICU  My cc time 35 minutes  Roselie Awkward, MD [place] [facility] Pager: [phone] Cell: [phone] After 7pm: [phone]

## 2022-04-07 NOTE — Progress Notes (Signed)
Speech Language Pathology Treatment: Dysphagia  Patient Details Name: Bradley Tran MRN: [id number] DOB: 01/08/1951 Today's Date: 04/07/2022 Time: [id number] SLP Time Calculation (min) (ACUTE ONLY): 31 min  Assessment / Plan / Recommendation Clinical Impression  Bradley Tran was more alert and interactive today.  He was oriented to place/situation; followed commands to cough, hold the cup, allow suctioning with verbal cues. He was positioned upright and oral care was provided, removing thick, dry secretions from tongue and palate.  He demonstrated recognition of bolus material today, accepting bites of pudding and sips of thin and honey-thick liquids with palpable swallow. There was intermittent coughing, particularly initially, as he loosened then expelled more mucous. Frequency of coughing decreased as he drank more liquids and was able to clear his pharynx.  Recommend starting dysphagia 1, thin liquids for today.  His risk for aspiration and adverse consequences should decrease with ongoing improvements in MS and time since extubation. If he is coughing frequently while eating, hold tray.  SLP will follow for safety/diet advancement. D/W RN.    HPI HPI: Patient is a 72 year old male presented to [facility] ED on 2/23 with acute respiratory failure with hypoxia after having [facility] in [facility] day when he would not allow workup. COVID+  CXR 2/23 and 2/24 without acute findings.  Pt required ETT 2/24-2/26, just under 48 hours. Pt with pertinent PMH COPD, bipolar, DMT2, hypothyroidism, PE in nov 2022 no longer on ac.      SLP Plan  Continue with current plan of care      Recommendations for follow up therapy are one component of a multi-disciplinary discharge planning process, led by the attending physician.  Recommendations may be updated based on patient status, additional functional criteria and insurance authorization.    Recommendations  Diet recommendations: Dysphagia 1 (puree);Thin  liquid Liquids provided via: Cup;Straw Medication Administration: Whole meds with puree Supervision: Staff to assist with self feeding Compensations: Minimize environmental distractions Postural Changes and/or Swallow Maneuvers: Seated upright 90 degrees                Oral Care Recommendations: Oral care BID Follow Up Recommendations: No SLP follow up SLP Visit Diagnosis: Dysphagia, unspecified (R13.10) Plan: Continue with current plan of care         Bradley Tran L. Bradley Ringer, MA CCC/SLP Clinical Specialist - Acute Care SLP Acute Rehabilitation Services Office number [phone]   Bradley Tran  04/07/2022, 10:01 AM

## 2022-04-07 NOTE — Progress Notes (Signed)
Echocardiogram 2D Echocardiogram has been performed.  Bradley Tran 04/07/2022, 8:36 AM

## 2022-04-07 NOTE — Progress Notes (Signed)
PT Cancellation Note  Patient Details Name: Bradley Tran MRN: [id number] DOB: 21-Jan-1951   Cancelled Treatment:    Reason Eval/Treat Not Completed: Medical issues which prohibited therapy. Pt remains agitated, yelling obscenities at this PT upon entrance into room. Pt tells this PT to "get the fuck out" when asked if there is anything that can be done for him. Pt does not top to listen to PT education on PT role or importance. PT will attempt to follow up when the pt is less agitated.   Zenaida Niece 04/07/2022, 3:10 PM

## 2022-04-07 NOTE — Progress Notes (Cosign Needed)
NAME:  Bradley Tran, MRN:  [id number], DOB:  03/14/1950, LOS: 4 ADMISSION DATE:  03/24/2022, CONSULTATION DATE:  2/23 REFERRING MD:  Dr. Francia Greaves, CHIEF COMPLAINT: Acute respiratory failure with hypoxia  History of Present Illness:  Patient is a 72 year old male with pertinent PMH COPD, bipolar, DMT2, hypothyroidism, PE in nov 2022 no longer on ac presents to [facility] ED on 2/23 with acute respiratory failure with hypoxia.  Patient is homeless.  On 2/23 patient came to [facility] long ED for difficulty breathing.  Patient was belligerent and cussing at staff.  Patient was unwilling to have workup done and was sent home.  Later today patient was witnessed by bystanders having some increased SOB.  EMS called.  Patient tachypneic with sats 60-70% on room air.  Distant breath sounds.  Given breathing treatments, mag, and IV steroids.   Transferred to [facility] ED.  Upon arrival to [facility] ED patient still in respiratory distress.  Patient somnolent but arousable.  States he feels slightly better with breathing treatments.  States that a week ago he was diagnosed with COVID.  He has productive cough with bodyaches.  Vital stable.  Tmax 99.3 F.  VBG showing 7.15, 58, 145, 20.5.  Patient placed on BiPAP.  COVID-positive.  Cultures obtained.  CXR with no significant findings. Patient had episode of agitation ripping off bipap and pulling out iv's. Given ativan. PCCM consulted for icu admission.  Pertinent ED labs: WBC 7.1, Hgb 9, ethanol WNL, UDS and UA pending, creat 6.57 (baseline 4), BUN 79, GFR 9, troponin 9, bnp 158.  Pertinent  Medical History   Past Medical History:  Diagnosis Date   Acute renal failure ([place])    Bipolar disorder, unspecified ([place])    Diabetes mellitus    Hyperlipidemia    Hypothyroidism     Significant Hospital Events: Including procedures, antibiotic start and stop dates in addition to other pertinent events   2/23 admitted to [facility] resp failure on bipap 2/24 Intubated 2/26 No acute events  overnight, remains on vent 40 FIO2, low min ventilation on SBT trial this am  2/27 extubated successfully yesterday, agitated overnight requiring precedex and haldol 2/28 still on precedex this morning, passed swallow  Interim History / Subjective:  Agitated overnight and remains on precedex  Objective   Blood pressure 122/69, pulse 69, temperature 97.9 F (36.6 C), temperature source Axillary, resp. rate 16, weight 53.7 kg, SpO2 100 %.        Intake/Output Summary (Last 24 hours) at 04/07/2022 0844 Last data filed at 04/07/2022 0730 Gross per 24 hour  Intake 1270.6 ml  Output 2125 ml  Net -854.4 ml    Filed Weights   04/05/22 0400 04/06/22 0407 04/07/22 0417  Weight: 55.1 kg 54.5 kg 53.7 kg    General:  chronically and acutely ill-appearing M, awake, currently calm HEENT: MM pink/moist, sclera anicteric Neuro: awake, following commands, oriented to person and place, speech difficult to understand CV: s1s2 rrr, no m/r/g PULM:  mildly reduced air entry in the bilateral bases, no distress or wheezing on nasal cannula GI: soft, non-distended  Extremities: warm/dry, no edema  Skin: no rashes or lesions    Resolved Hospital Problem list   Prolonged qtc  Assessment & Plan:    Acute respiratory failure with hypoxia/hypercarbia COPD exacerbation COVID positive -extubated 2/26, doing well on nasal cannula -no wheezing, stop prednisone today as may be contributing to agitation -remdesivir stopped  -continue yupelri, brovana and duonebs -continue doxy and Airborne precautions   Bipolar  Disorder with Acute Agitation Have been unable to resume seroquel as did not pass swallow screen -cortrak or repeat swallow today, attempt to d/c precedex -on precedex and required restraints overnight -prn Haldol  Type 2 diabetes  -Hgb1c 6.5 P: -Continue SSI' -CBG goal 140-180 -continue to hold home long acting   Hx PE 12/2020  -Self discontinued anticoagulation 03/2021. Unclear if  provoked or unprovoked Risk for VTE in setting of COVID P: -has been on heparin gtt since arrival as unable to obtain CTA 2/2 renal function -no dvt, echo pending, stop heparin gtt today  AKI on CKD 4  -Creatinine 4.01 with GFR 9 04/05/21, creatinine 6.47 with GFR 9 on admit  P: -Monitor urine output, Trend Bmet, Avoid nephrotoxins -Ensure adequate renal perfusion   Hx of HTN/HLD  P: -Continuous telemetry    Anemia -Appears chronic Thrombocytopenia  P: -Trend CBC  -Transfuse per protocol  -Hgb goal > 7  Hypothyroidism -TSH 75 FT4 0.45 P: -Continue IV Synthroid     Best Practice (right click and "Reselect all SmartList Selections" daily)   Diet/type: clears, passed swallow DVT prophylaxis: prophylactic heparin  GI prophylaxis: N/A Lines: N/A Foley:  N/A Code Status:  full code Last date of multidisciplinary goals of care discussion [no contact on file]   Critical care time:  CRITICAL CARE Performed by: Otilio Carpen Malyna Budney  Total critical care time: 30 minutes  Critical care time was exclusive of separately billable procedures and treating other patients.  Critical care was necessary to treat or prevent imminent or life-threatening deterioration.  Critical care was time spent personally by me on the following activities: development of treatment plan with patient and/or surrogate as well as nursing, discussions with consultants, evaluation of patient's response to treatment, examination of patient, obtaining history from patient or surrogate, ordering and performing treatments and interventions, ordering and review of laboratory studies, ordering and review of radiographic studies, pulse oximetry and re-evaluation of patient's condition.  Otilio Carpen Shaneya Taketa, PA-C [facility] See Amion for pager If no response to pager , please call [phone] until 7pm After 7:00 pm call Elink  [phone]?[place]

## 2022-04-08 DIAGNOSIS — J9601 Acute respiratory failure with hypoxia: Secondary | ICD-10-CM | POA: Diagnosis not present

## 2022-04-08 DIAGNOSIS — E43 Unspecified severe protein-calorie malnutrition: Secondary | ICD-10-CM | POA: Insufficient documentation

## 2022-04-08 LAB — HEPATIC FUNCTION PANEL
ALT: 15 U/L (ref 0–44)
AST: 14 U/L — ABNORMAL LOW (ref 15–41)
Albumin: 3.1 g/dL — ABNORMAL LOW (ref 3.5–5.0)
Alkaline Phosphatase: 63 U/L (ref 38–126)
Bilirubin, Direct: <0.1 mg/dL (ref 0.0–0.2)
Total Bilirubin: 0.7 mg/dL (ref 0.3–1.2)
Total Protein: 6.1 g/dL — ABNORMAL LOW (ref 6.5–8.1)

## 2022-04-08 LAB — BASIC METABOLIC PANEL
Anion gap: 12 (ref 5–15)
BUN: 142 mg/dL — ABNORMAL HIGH (ref 8–23)
CO2: 23 mmol/L (ref 22–32)
Calcium: 8.7 mg/dL — ABNORMAL LOW (ref 8.9–10.3)
Chloride: 118 mmol/L — ABNORMAL HIGH (ref 98–111)
Creatinine, Ser: 5.48 mg/dL — ABNORMAL HIGH (ref 0.61–1.24)
GFR, Estimated: 11 mL/min — ABNORMAL LOW (ref >60)
Glucose, Bld: 111 mg/dL — ABNORMAL HIGH (ref 70–99)
Potassium: 4.7 mmol/L (ref 3.5–5.1)
Sodium: 153 mmol/L — ABNORMAL HIGH (ref 135–145)

## 2022-04-08 LAB — CULTURE, BLOOD (ROUTINE X 2)
Culture: NO GROWTH
Culture: NO GROWTH
Special Requests: ADEQUATE

## 2022-04-08 LAB — MAGNESIUM
Magnesium: 2.6 mg/dL — ABNORMAL HIGH (ref 1.7–2.4)
Magnesium: 2.6 mg/dL — ABNORMAL HIGH (ref 1.7–2.4)

## 2022-04-08 LAB — AMMONIA: Ammonia: <10 umol/L (ref 9–35)

## 2022-04-08 LAB — CBC
HCT: 24.9 % — ABNORMAL LOW (ref 39.0–52.0)
Hemoglobin: 7.4 g/dL — ABNORMAL LOW (ref 13.0–17.0)
MCH: 30.6 pg (ref 26.0–34.0)
MCHC: 29.7 g/dL — ABNORMAL LOW (ref 30.0–36.0)
MCV: 102.9 fL — ABNORMAL HIGH (ref 80.0–100.0)
Platelets: 126 10*3/uL — ABNORMAL LOW (ref 150–400)
RBC: 2.42 MIL/uL — ABNORMAL LOW (ref 4.22–5.81)
RDW: 14.5 % (ref 11.5–15.5)
WBC: 10.7 10*3/uL — ABNORMAL HIGH (ref 4.0–10.5)
nRBC: 0 % (ref 0.0–0.2)

## 2022-04-08 LAB — GLUCOSE, CAPILLARY
Glucose-Capillary: 92 mg/dL (ref 70–99)
Glucose-Capillary: 79 mg/dL (ref 70–99)
Glucose-Capillary: 103 mg/dL — ABNORMAL HIGH (ref 70–99)
Glucose-Capillary: 131 mg/dL — ABNORMAL HIGH (ref 70–99)
Glucose-Capillary: 141 mg/dL — ABNORMAL HIGH (ref 70–99)
Glucose-Capillary: 145 mg/dL — ABNORMAL HIGH (ref 70–99)

## 2022-04-08 LAB — PHOSPHORUS
Phosphorus: 8.2 mg/dL — ABNORMAL HIGH (ref 2.5–4.6)
Phosphorus: 8.3 mg/dL — ABNORMAL HIGH (ref 2.5–4.6)

## 2022-04-08 MED ORDER — FREE WATER
100.0000 mL | Freq: Four times a day (QID) | Status: DC
  Administered 2022-04-08 (×2): 100 mL

## 2022-04-08 MED ORDER — ZIPRASIDONE MESYLATE 20 MG IM SOLR
10.0000 mg | INTRAMUSCULAR | Status: DC | PRN
  Administered 2022-04-08 – 2022-04-10 (×3): 10 mg via INTRAMUSCULAR
  Filled 2022-04-08 (×6): qty 20

## 2022-04-08 MED ORDER — FREE WATER
200.0000 mL | Status: DC
  Administered 2022-04-08 – 2022-04-09 (×4): 200 mL

## 2022-04-08 MED ORDER — QUETIAPINE FUMARATE 100 MG PO TABS
200.0000 mg | ORAL_TABLET | Freq: Three times a day (TID) | ORAL | Status: DC
  Administered 2022-04-08 – 2022-04-11 (×10): 200 mg
  Filled 2022-04-08 (×10): qty 2

## 2022-04-08 MED ORDER — DOXYCYCLINE HYCLATE 100 MG PO TABS
100.0000 mg | ORAL_TABLET | Freq: Two times a day (BID) | ORAL | Status: AC
  Administered 2022-04-08 – 2022-04-09 (×4): 100 mg
  Filled 2022-04-08 (×4): qty 1

## 2022-04-08 NOTE — Progress Notes (Signed)
NAME:  Bradley Tran, MRN:  [id number], DOB:  02/10/1950, LOS: 5 ADMISSION DATE:  03/21/2022, CONSULTATION DATE:  2/23 REFERRING MD:  Dr. Francia Greaves, CHIEF COMPLAINT: Acute respiratory failure with hypoxia  History of Present Illness:  Patient is a 72 year old male with pertinent PMH COPD, bipolar, DMT2, hypothyroidism, PE in nov 2022 no longer on ac presents to [facility] ED on 2/23 with acute respiratory failure with hypoxia.  Patient is homeless.  On 2/23 patient came to [facility] long ED for difficulty breathing.  Patient was belligerent and cussing at staff.  Patient was unwilling to have workup done and was sent home.  Later today patient was witnessed by bystanders having some increased SOB.  EMS called.  Patient tachypneic with sats 60-70% on room air.  Distant breath sounds.  Given breathing treatments, mag, and IV steroids.   Transferred to [facility] ED.  Upon arrival to [facility] ED patient still in respiratory distress.  Patient somnolent but arousable.  States he feels slightly better with breathing treatments.  States that a week ago he was diagnosed with COVID.  He has productive cough with bodyaches.  Vital stable.  Tmax 99.3 F.  VBG showing 7.15, 58, 145, 20.5.  Patient placed on BiPAP.  COVID-positive.  Cultures obtained.  CXR with no significant findings. Patient had episode of agitation ripping off bipap and pulling out iv's. Given ativan. PCCM consulted for icu admission.  Pertinent ED labs: WBC 7.1, Hgb 9, ethanol WNL, UDS and UA pending, creat 6.57 (baseline 4), BUN 79, GFR 9, troponin 9, bnp 158.  Pertinent  Medical History   Past Medical History:  Diagnosis Date   Acute renal failure ([place])    Bipolar disorder, unspecified ([place])    Diabetes mellitus    Hyperlipidemia    Hypothyroidism     Significant Hospital Events: Including procedures, antibiotic start and stop dates in addition to other pertinent events   2/23 admitted to [facility] resp failure on bipap 2/24 Intubated 2/26 No acute events  overnight, remains on vent 40 FIO2, low min ventilation on SBT trial this am  2/27 extubated successfully yesterday, agitated overnight requiring precedex and haldol 2/28 still on precedex this morning, passed swallow 2/29 unable to wean off precedex  Interim History / Subjective:  Yelling obscenities at everyone who enters Clinically stable  Objective   Blood pressure 128/80, pulse 76, temperature (!) 97.5 F (36.4 C), temperature source Axillary, resp. rate 15, weight 53.8 kg, SpO2 93 %.        Intake/Output Summary (Last 24 hours) at 04/08/2022 0814 Last data filed at 04/08/2022 0700 Gross per 24 hour  Intake 396.03 ml  Output 1340 ml  Net -943.97 ml    Filed Weights   04/06/22 0407 04/07/22 0417 04/08/22 0353  Weight: 54.5 kg 53.7 kg 53.8 kg    General:  chronically and acutely ill-appearing M, awake, angry yelling obscenities  HEENT: MM pink/moist, sclera anicteric Neuro: awake, angry, will not answer questions CV: s1s2 rrr, no m/r/g PULM:  no distress and clear lungs on nasal cannula GI: soft, non-distended  Extremities: warm/dry, no edema  Skin: no rashes or lesions    Resolved Hospital Problem list   Prolonged qtc  Assessment & Plan:    Acute respiratory failure with hypoxia/hypercarbia COPD exacerbation COVID positive -extubated 2/26, doing well on nasal cannula -no wheezing, stopped prednisone and remdesevir -continue yupelri, brovana and duonebs -continue doxy and Airborne precautions   Bipolar Disorder with Acute Agitation Have been unable to resume seroquel  as did not pass swallow screen -has  cortrak -his agitation is increased today, had resumed a lower dose of seroquel than his high home dose, will resume 200 tid and see if this helps -stop precedex, change prn haldol to geodon, monitor qtc -labs pending today, check LFT's and ammonia, may need inpatient psych consult  Type 2 diabetes  -Hgb1c 6.5 P: -Continue SSI' -CBG goal  140-180 -continue to hold home long acting   Hx PE 12/2020  -Self discontinued anticoagulation 03/2021. Unclear if provoked or unprovoked Risk for VTE in setting of COVID P: -has been on heparin gtt since arrival as unable to obtain CTA 2/2 renal function -no dvt, echo reassuring no R heart strain, heparin stopped yesterday  AKI on CKD 4  -Creatinine 4.01 with GFR 9 04/05/21, creatinine 6.47 with GFR 9 on admit  P: -awaiting labs today, consider nephrology consult, making urine -Monitor urine output, Trend Bmet, Avoid nephrotoxins -Ensure adequate renal perfusion   Hx of HTN/HLD  P: -Continuous telemetry    Anemia -Appears chronic Thrombocytopenia  P: -Trend CBC  -Transfuse per protocol  -Hgb goal > 7  Hypothyroidism -TSH 75 FT4 0.45 P: -Continue IV Synthroid     Best Practice (right click and "Reselect all SmartList Selections" daily)   Diet/type: clears, passed swallow,  DVT prophylaxis: prophylactic heparin  GI prophylaxis: N/A Lines: N/A Foley:  N/A Code Status:  full code Last date of multidisciplinary goals of care discussion [no contact on file]   Critical care time:  CRITICAL CARE Performed by: Otilio Carpen Captola Teschner  Total critical care time: 32 minutes  Critical care time was exclusive of separately billable procedures and treating other patients.  Critical care was necessary to treat or prevent imminent or life-threatening deterioration.  Critical care was time spent personally by me on the following activities: development of treatment plan with patient and/or surrogate as well as nursing, discussions with consultants, evaluation of patient's response to treatment, examination of patient, obtaining history from patient or surrogate, ordering and performing treatments and interventions, ordering and review of laboratory studies, ordering and review of radiographic studies, pulse oximetry and re-evaluation of patient's condition.  Otilio Carpen Kadarrius Yanke,  PA-C [facility] See Amion for pager If no response to pager , please call [phone] until 7pm After 7:00 pm call Elink  [phone]?[place]

## 2022-04-08 NOTE — Progress Notes (Signed)
PT Cancellation Note  Patient Details Name: Bogar Dopson MRN: [id number] DOB: 1950-05-27   Cancelled Treatment:    Reason Eval/Treat Not Completed: Medical issues which prohibited therapy - continued agitation per chart review, PT to check back.  Stacie Glaze, PT DPT Acute Rehabilitation Services Pager [phone]  Office [phone]    Louis Matte 04/08/2022, 2:05 PM

## 2022-04-08 NOTE — Progress Notes (Signed)
[facility] Progress Note Patient Name: Reuven Hildebrand DOB: [birth year] MRN: [id number]   Date of Service  04/08/2022  HPI/Events of Note  Patient needs restraints order renewed, he is a fall risk.  eICU Interventions  Restraints order renewed.        Frederik Pear 04/08/2022, 9:53 PM

## 2022-04-08 NOTE — Consult Note (Signed)
[facility] Renal Consultation Note  Requesting MD: McQuaid Indication for Consultation: ckd-  A on CRF  HPI:  Bradley Tran is a 72 y.o. male with bipolar d/o, DM, COPD.  He is also homeless.  He also appears to have pretty advanced CKD that is longstanding-  crt over 3 in 2011- had kind of remained in the high2's and 3's-  but then creeping into the 4's in early 2023. He was admitted on 2/23 with c/o SOB-  had had covid.   Treated with nebs/steroids.   Presenting crt was 6.5-  inc to 7.2 but then since has been decreasing slowly -  is 5.48 today -  also BUN though is climbing-  is 140 and sodium is high today as well.  Of note, pt seems to be having some kind of a psychotic break- is yelling and cursing at staff-  has been very uncooperative with really anything-  what I did was look at pt from afar as he has been abusive to staff and he is currently resting-  has eyes closed, is restless, pale.  Has a coretrack in place getting free water   Creatinine, Ser  Date/Time Value Ref Range Status  04/08/2022 08:33 AM 5.48 (H) 0.61 - 1.24 mg/dL Final  04/06/2022 03:01 AM 5.88 (H) 0.61 - 1.24 mg/dL Final  04/05/2022 07:05 AM 5.80 (H) 0.61 - 1.24 mg/dL Final  04/04/2022 04:36 PM 5.89 (H) 0.61 - 1.24 mg/dL Final  04/04/2022 01:01 AM 6.04 (H) 0.61 - 1.24 mg/dL Final  04/03/2022 08:03 PM 6.10 (H) 0.61 - 1.24 mg/dL Final  04/03/2022 05:38 AM 6.47 (H) 0.61 - 1.24 mg/dL Final  04/03/2022 01:42 AM 6.56 (H) 0.61 - 1.24 mg/dL Final  04/06/2022 09:51 PM 7.20 (H) 0.61 - 1.24 mg/dL Final  03/23/2022 09:36 PM 6.57 (H) 0.61 - 1.24 mg/dL Final  04/05/2021 12:51 AM 4.01 (H) 0.61 - 1.24 mg/dL Final  04/04/2021 05:01 AM 3.97 (H) 0.61 - 1.24 mg/dL Final  04/03/2021 11:58 PM 3.91 (H) 0.61 - 1.24 mg/dL Final  12/29/2020 05:00 AM 4.65 (H) 0.61 - 1.24 mg/dL Final  12/28/2020 10:25 AM 4.77 (H) 0.61 - 1.24 mg/dL Final  02/09/2011 01:28 AM 1.80 (H) 0.50 - 1.35 mg/dL Final  01/13/2010 05:45 AM 2.95 (H) 0.4 -  1.5 mg/dL Final  01/12/2010 05:25 AM 3.06 (H) 0.4 - 1.5 mg/dL Final  01/11/2010 04:55 AM 3.01 (H) 0.4 - 1.5 mg/dL Final  01/10/2010 04:30 AM 3.16 (H) 0.4 - 1.5 mg/dL Final  01/09/2010 03:47 AM 3.04 (H) 0.4 - 1.5 mg/dL Final  01/08/2010 03:59 AM 3.41 (H) 0.4 - 1.5 mg/dL Final  01/07/2010 03:35 AM 3.01 (H) 0.4 - 1.5 mg/dL Final  01/06/2010 03:19 AM 2.68 (H) 0.4 - 1.5 mg/dL Final  01/05/2010 03:20 AM 2.60 (H) 0.4 - 1.5 mg/dL Final  01/04/2010 03:54 AM 2.73 (H) 0.4 - 1.5 mg/dL Final  01/02/2010 03:01 AM 2.85 (H) 0.4 - 1.5 mg/dL Final  01/01/2010 04:23 AM 3.24 (H) 0.4 - 1.5 mg/dL Final  12/31/2009 03:25 AM 3.54 (H) 0.4 - 1.5 mg/dL Final  12/30/2009 03:15 AM 3.82 (H) 0.4 - 1.5 mg/dL Final  12/29/2009 12:20 PM 3.83 (H) 0.4 - 1.5 mg/dL Final  12/28/2009 01:34 PM 3.34 (H) 0.4 - 1.5 mg/dL Final  12/28/2009 03:33 AM 3.29 (H) 0.4 - 1.5 mg/dL Final  12/27/2009 06:40 PM 3.22 (H) 0.4 - 1.5 mg/dL Final  09/16/2007 12:10 PM 1.6 (H)  Final     PMHx:   Past Medical History:  Diagnosis Date   Acute renal failure ([name])    Bipolar disorder, unspecified ([place])    Diabetes mellitus    Hyperlipidemia    Hypothyroidism     Past Surgical History:  Procedure Laterality Date   OTHER SURGICAL HISTORY     lymph node removed from right arm    Family Hx: No family history on file.  Social History:  reports that he quit smoking about 13 years ago. His smoking use included cigarettes. He has a 120.00 pack-year smoking history. He does not have any smokeless tobacco history on file. He reports that he does not currently use alcohol. He reports that he does not currently use drugs.  Allergies:  Allergies  Allergen Reactions   Budesonide-Formoterol Fumarate Swelling    Ph[facility] by [facility]   Trifluoperazine Other (See Comments)    Muscle aches    Medications: Prior to Admission medications   Medication Sig Start Date End Date Taking? Authorizing Provider  apixaban (ELIQUIS) 5 MG TABS  tablet Take 1 tablet (5 mg total) by mouth 2 (two) times daily. Patient not taking: Reported on 04/04/2021 01/06/21   [name], MD  atorvastatin (LIPITOR) 80 MG tablet Take 40 mg by mouth daily. Patient not taking: Reported on 04/04/2021 12/09/20   [provider]  calcitRIOL (ROCALTROL) 0.25 MCG capsule Take 1 capsule by mouth daily. Patient not taking: Reported on 04/04/2021 12/09/20   [provider]  Cholecalciferol 25 MCG (1000 UT) tablet Take 1,000 Units by mouth every morning. 12/09/20   [provider]  hydrOXYzine (ATARAX) 10 MG tablet Take 10 mg by mouth 2 (two) times daily.    [provider]  insulin glargine (LANTUS) 100 UNIT/ML injection Inject 20 Units into the skin daily before breakfast.    [provider]  insulin glargine-yfgn (SEMGLEE) 100 UNIT/ML injection Inject 20 Units into the skin daily. Patient not taking: Reported on 04/04/2021 12/09/20   [provider]  levothyroxine (SYNTHROID) 100 MCG tablet Take 1 tablet (100 mcg total) by mouth daily. 04/06/21   [name], MD  methocarbamol (ROBAXIN) 500 MG tablet Take 1 tablet (500 mg total) by mouth 2 (two) times daily. 01/20/22   Garald Balding, PA-C  QUEtiapine (SEROQUEL) 200 MG tablet Take 200 mg by mouth in the morning, at noon, and at bedtime. 05/05/20   [provider]  Tiotropium Bromide-Olodaterol 2.5-2.5 MCG/ACT AERS Inhale 2 puffs into the lungs daily. Patient not taking: Reported on 04/04/2021 12/09/20   [provider]    I have reviewed the patient's current medications.  Labs:  Results for orders placed or performed during the hospital encounter of 04/04/2022 (from the past 48 hour(s))  Glucose, capillary     Status: Abnormal   Collection Time: 04/06/22  3:29 PM  Result Value Ref Range   Glucose-Capillary 129 (H) 70 - 99 mg/dL    Comment: Glucose reference range applies only to samples taken after fasting for at least 8 hours.   Heparin level (unfractionated)     Status: None   Collection Time: 04/06/22  4:31 PM  Result Value Ref Range   Heparin Unfractionated 0.54 0.30 - 0.70 IU/mL    Comment: (NOTE) The clinical reportable range upper limit is being lowered to >1.10 to align with the FDA approved guidance for the current laboratory assay.  If heparin results are below expected values, and patient dosage has  been confirmed, suggest follow up testing of antithrombin III levels. Performed at  [facility], [place] [address] [place], [place] [address]   Magnesium     Status: Abnormal   Collection Time: 04/06/22  4:31 PM  Result Value Ref Range   Magnesium 2.5 (H) 1.7 - 2.4 mg/dL    Comment: Performed at [facility] [address] [place], [place] [address]  Phosphorus     Status: Abnormal   Collection Time: 04/06/22  4:31 PM  Result Value Ref Range   Phosphorus 8.5 (H) 2.5 - 4.6 mg/dL    Comment: Performed at [facility] [address], [place], [place] [address]  Glucose, capillary     Status: Abnormal   Collection Time: 04/06/22  7:32 PM  Result Value Ref Range   Glucose-Capillary 120 (H) 70 - 99 mg/dL    Comment: Glucose reference range applies only to samples taken after fasting for at least 8 hours.  Glucose, capillary     Status: Abnormal   Collection Time: 04/06/22 11:27 PM  Result Value Ref Range   Glucose-Capillary 114 (H) 70 - 99 mg/dL    Comment: Glucose reference range applies only to samples taken after fasting for at least 8 hours.  Glucose, capillary     Status: Abnormal   Collection Time: 04/07/22  3:16 AM  Result Value Ref Range   Glucose-Capillary 113 (H) 70 - 99 mg/dL    Comment: Glucose reference range applies only to samples taken after fasting for at least 8 hours.  Glucose, capillary     Status: None   Collection Time: 04/07/22  7:18 AM  Result Value Ref Range   Glucose-Capillary 97 70 - 99 mg/dL    Comment: Glucose reference range applies only to samples  taken after fasting for at least 8 hours.  Glucose, capillary     Status: Abnormal   Collection Time: 04/07/22 12:19 PM  Result Value Ref Range   Glucose-Capillary 141 (H) 70 - 99 mg/dL    Comment: Glucose reference range applies only to samples taken after fasting for at least 8 hours.  Glucose, capillary     Status: Abnormal   Collection Time: 04/07/22  3:18 PM  Result Value Ref Range   Glucose-Capillary 123 (H) 70 - 99 mg/dL    Comment: Glucose reference range applies only to samples taken after fasting for at least 8 hours.  Glucose, capillary     Status: Abnormal   Collection Time: 04/07/22  7:20 PM  Result Value Ref Range   Glucose-Capillary 116 (H) 70 - 99 mg/dL    Comment: Glucose reference range applies only to samples taken after fasting for at least 8 hours.  Glucose, capillary     Status: Abnormal   Collection Time: 04/07/22 11:18 PM  Result Value Ref Range   Glucose-Capillary 177 (H) 70 - 99 mg/dL    Comment: Glucose reference range applies only to samples taken after fasting for at least 8 hours.  Glucose, capillary     Status: None   Collection Time: 04/08/22  3:24 AM  Result Value Ref Range   Glucose-Capillary 92 70 - 99 mg/dL    Comment: Glucose reference range applies only to samples taken after fasting for at least 8 hours.  Glucose, capillary     Status: None   Collection Time: 04/08/22  7:31 AM  Result Value Ref Range   Glucose-Capillary 79 70 - 99 mg/dL    Comment: Glucose reference range applies only to samples taken after fasting for at least 8  hours.  Hepatic function panel     Status: Abnormal   Collection Time: 04/08/22  8:26 AM  Result Value Ref Range   Total Protein 6.1 (L) 6.5 - 8.1 g/dL   Albumin 3.1 (L) 3.5 - 5.0 g/dL   AST 14 (L) 15 - 41 U/L   ALT 15 0 - 44 U/L   Alkaline Phosphatase 63 38 - 126 U/L   Total Bilirubin 0.7 0.3 - 1.2 mg/dL   Bilirubin, Direct <0.1 0.0 - 0.2 mg/dL   Indirect Bilirubin NOT CALCULATED 0.3 - 0.9 mg/dL    Comment:  Performed at [facility] [address], [place], [place] [address]  CBC     Status: Abnormal   Collection Time: 04/08/22  8:33 AM  Result Value Ref Range   WBC 10.7 (H) 4.0 - 10.5 K/uL   RBC 2.42 (L) 4.22 - 5.81 MIL/uL   Hemoglobin 7.4 (L) 13.0 - 17.0 g/dL   HCT 24.9 (L) 39.0 - 52.0 %   MCV 102.9 (H) 80.0 - 100.0 fL   MCH 30.6 26.0 - 34.0 pg   MCHC 29.7 (L) 30.0 - 36.0 g/dL   RDW 14.5 11.5 - 15.5 %   Platelets 126 (L) 150 - 400 K/uL   nRBC 0.0 0.0 - 0.2 %    Comment: Performed at [facility], [place] [address] [place], [place] [address]  Magnesium     Status: Abnormal   Collection Time: 04/08/22  8:33 AM  Result Value Ref Range   Magnesium 2.6 (H) 1.7 - 2.4 mg/dL    Comment: Performed at [facility] [address] [place], [place] [address]  Phosphorus     Status: Abnormal   Collection Time: 04/08/22  8:33 AM  Result Value Ref Range   Phosphorus 8.2 (H) 2.5 - 4.6 mg/dL    Comment: Performed at [facility] [address] [place], [place] [id number]  Basic metabolic panel     Status: Abnormal   Collection Time: 04/08/22  8:33 AM  Result Value Ref Range   Sodium 153 (H) 135 - 145 mmol/L   Potassium 4.7 3.5 - 5.1 mmol/L   Chloride 118 (H) 98 - 111 mmol/L   CO2 23 22 - 32 mmol/L   Glucose, Bld 111 (H) 70 - 99 mg/dL    Comment: Glucose reference range applies only to samples taken after fasting for at least 8 hours.   BUN 142 (H) 8 - 23 mg/dL   Creatinine, Ser 5.48 (H) 0.61 - 1.24 mg/dL   Calcium 8.7 (L) 8.9 - 10.3 mg/dL   GFR, Estimated 11 (L) >60 mL/min    Comment: (NOTE) Calculated using the CKD-EPI Creatinine Equation (2021)    Anion gap 12 5 - 15    Comment: Performed at [facility] [address], [place], [place] [address]  Ammonia     Status: None   Collection Time: 04/08/22 10:58 AM  Result Value Ref Range   Ammonia <10 9 - 35 umol/L    Comment: Performed at [facility], [place] [address], [place], [place] [address]   Glucose, capillary     Status: Abnormal   Collection Time: 04/08/22 11:19 AM  Result Value Ref Range   Glucose-Capillary 103 (H) 70 - 99 mg/dL    Comment: Glucose reference range applies only to samples taken after fasting for at least 8 hours.     ROS:  Review of systems not obtained  due to patient factors.  Physical Exam: Vitals:   04/08/22 0900 04/08/22 1122  BP: 121/74   Pulse: 76   Resp: 14   Temp:  (!) 96.4 F (35.8 C)  SpO2: 93%      General: pale, thin-  tied down- on precedex- slightly restless-  per staff yells and is not cooperative when awake-  has coretrack in place Extremities: per nursing no edema present   Assessment/Plan: 72 year old WM with psych d/o- as well as chronic medical illnesses, homelessness so not controlled.  He has advanced CKD 1.Renal- advanced CKD and now with A on CRF that is correcting slowly but with high BUN -  the high BUN could be from previous steroids or he could be volume depleted since his sodium is high as well. I agree with the coretrack to give free water-  will increase dose of free water.  There is no absolute indication for dialysis -  I really do not think we would even physically be able to do dialysis in this gentleman given his inability to cooperate-  his homelessness is another factor.  In my opinion he is not a candidate for dialysis in this state.  If by some miracle he becomes more cooperative we MIGHT reconsider-  right now-  hydrate with free water to normalize sodium  2. Hypertension/volume  - volume depleted and free water deficit-  being addressed  3. Anemia  - hgb very low-  I'm sure he would not take kindly to ESA injections-  supportive care for now  4. Hypernatremia-  free water deficit-  being repleted    Louis Meckel 04/08/2022, 11:49 AM

## 2022-04-08 NOTE — Progress Notes (Signed)
Attending:    Subjective: Remains angry, verbally abusive to staff Has been on precedex  Objective: Vitals:   04/08/22 0700 04/08/22 0734 04/08/22 0831 04/08/22 0832  BP: 128/80     Pulse: 76     Resp: 15     Temp:  (!) 97.5 F (36.4 C)    TempSrc:  Axillary    SpO2: 93%  99% 99%  Weight:          Intake/Output Summary (Last 24 hours) at 04/08/2022 0900 Last data filed at 04/08/2022 0700 Gross per 24 hour  Intake 1089.17 ml  Output 1540 ml  Net -450.83 ml    General:  Resting comfortably in bed HENT: NCAT OP clear PULM: CTA B, normal effort CV: RRR, no mgr GI: BS+, soft, nontender MSK: normal bulk and tone Neuro: awake, moving all four ext Psyche: yelling obscenities at me.   CBC    Component Value Date/Time   WBC 8.8 04/06/2022 0301   RBC 2.52 (L) 04/06/2022 0301   HGB 7.9 (L) 04/06/2022 0301   HCT 25.3 (L) 04/06/2022 0301   PLT 138 (L) 04/06/2022 0301   MCV 100.4 (H) 04/06/2022 0301   MCH 31.3 04/06/2022 0301   MCHC 31.2 04/06/2022 0301   RDW 14.5 04/06/2022 0301   LYMPHSABS 1.5 04/04/2022 2136   MONOABS 1.3 (H) 03/28/2022 2136   EOSABS 0.5 03/27/2022 2136   BASOSABS 0.0 03/20/2022 2136    BMET    Component Value Date/Time   NA 149 (H) 04/06/2022 0301   K 4.7 04/06/2022 0301   CL 111 04/06/2022 0301   CO2 19 (L) 04/06/2022 0301   GLUCOSE 105 (H) 04/06/2022 0301   BUN 132 (H) 04/06/2022 0301   CREATININE 5.88 (H) 04/06/2022 0301   CALCIUM 8.4 (L) 04/06/2022 0301   GFRNONAA 10 (L) 04/06/2022 0301   GFRAA 46 (L) 02/09/2011 0128     Impression/Plan: Remains critically  Bipolar disorder with behavioral disturbance: ongoing threat to self/others by pulling out necessary tubes/lines > increase seroquel to home dose, add geodon prn severe agitation/threatening to self or others, stop haldol, wean off precedex; will likely need psyche but we need to check labs to ensure there isn't a metabolic disturbance CKD/AKI> will discuss with nursing today,  need to check CMET, ammonia as well given behavioral disturbances above COPD exac > continue doxy, bronchodilators, holding steroids with behavioral disturbance Hypothyroid> continue synthroid  My cc time 32 minutes  Roselie Awkward, MD [facility] Pager: [phone] Cell: [phone] After 7pm: [phone]

## 2022-04-09 DIAGNOSIS — J9600 Acute respiratory failure, unspecified whether with hypoxia or hypercapnia: Secondary | ICD-10-CM | POA: Diagnosis not present

## 2022-04-09 DIAGNOSIS — U071 COVID-19: Secondary | ICD-10-CM | POA: Diagnosis not present

## 2022-04-09 DIAGNOSIS — J441 Chronic obstructive pulmonary disease with (acute) exacerbation: Secondary | ICD-10-CM | POA: Diagnosis not present

## 2022-04-09 DIAGNOSIS — J9601 Acute respiratory failure with hypoxia: Secondary | ICD-10-CM | POA: Diagnosis not present

## 2022-04-09 LAB — FERRITIN: Ferritin: 330 ng/mL (ref 24–336)

## 2022-04-09 LAB — BASIC METABOLIC PANEL
Anion gap: 13 (ref 5–15)
BUN: 144 mg/dL — ABNORMAL HIGH (ref 8–23)
CO2: 25 mmol/L (ref 22–32)
Calcium: 8.4 mg/dL — ABNORMAL LOW (ref 8.9–10.3)
Chloride: 118 mmol/L — ABNORMAL HIGH (ref 98–111)
Creatinine, Ser: 5.6 mg/dL — ABNORMAL HIGH (ref 0.61–1.24)
GFR, Estimated: 10 mL/min — ABNORMAL LOW (ref >60)
Glucose, Bld: 153 mg/dL — ABNORMAL HIGH (ref 70–99)
Potassium: 5 mmol/L (ref 3.5–5.1)
Sodium: 156 mmol/L — ABNORMAL HIGH (ref 135–145)

## 2022-04-09 LAB — CBC
HCT: 24.3 % — ABNORMAL LOW (ref 39.0–52.0)
Hemoglobin: 7.3 g/dL — ABNORMAL LOW (ref 13.0–17.0)
MCH: 30.8 pg (ref 26.0–34.0)
MCHC: 30 g/dL (ref 30.0–36.0)
MCV: 102.5 fL — ABNORMAL HIGH (ref 80.0–100.0)
Platelets: 101 10*3/uL — ABNORMAL LOW (ref 150–400)
RBC: 2.37 MIL/uL — ABNORMAL LOW (ref 4.22–5.81)
RDW: 14.6 % (ref 11.5–15.5)
WBC: 8.7 10*3/uL (ref 4.0–10.5)
nRBC: 0 % (ref 0.0–0.2)

## 2022-04-09 LAB — SODIUM, URINE, RANDOM: Sodium, Ur: 57 mmol/L

## 2022-04-09 LAB — IRON AND TIBC
Iron: 104 ug/dL (ref 45–182)
Saturation Ratios: 68 % — ABNORMAL HIGH (ref 17.9–39.5)
TIBC: 153 ug/dL — ABNORMAL LOW (ref 250–450)
UIBC: 49 ug/dL

## 2022-04-09 LAB — VITAMIN B12: Vitamin B-12: 1171 pg/mL — ABNORMAL HIGH (ref 180–914)

## 2022-04-09 LAB — GLUCOSE, CAPILLARY
Glucose-Capillary: 156 mg/dL — ABNORMAL HIGH (ref 70–99)
Glucose-Capillary: 128 mg/dL — ABNORMAL HIGH (ref 70–99)
Glucose-Capillary: 140 mg/dL — ABNORMAL HIGH (ref 70–99)
Glucose-Capillary: 120 mg/dL — ABNORMAL HIGH (ref 70–99)
Glucose-Capillary: 196 mg/dL — ABNORMAL HIGH (ref 70–99)
Glucose-Capillary: 115 mg/dL — ABNORMAL HIGH (ref 70–99)

## 2022-04-09 LAB — MAGNESIUM: Magnesium: 2.4 mg/dL (ref 1.7–2.4)

## 2022-04-09 LAB — PHOSPHORUS: Phosphorus: 7.6 mg/dL — ABNORMAL HIGH (ref 2.5–4.6)

## 2022-04-09 LAB — FOLATE: Folate: 8.9 ng/mL (ref >5.9)

## 2022-04-09 LAB — OSMOLALITY, URINE: Osmolality, Ur: 399 mOsm/kg (ref 300–900)

## 2022-04-09 MED ORDER — BETHANECHOL CHLORIDE 10 MG PO TABS
10.0000 mg | ORAL_TABLET | Freq: Three times a day (TID) | ORAL | Status: AC
  Administered 2022-04-09 – 2022-04-10 (×3): 10 mg
  Filled 2022-04-09 (×4): qty 1

## 2022-04-09 MED ORDER — STERILE WATER FOR INJECTION IJ SOLN
INTRAMUSCULAR | Status: AC
  Filled 2022-04-09: qty 10

## 2022-04-09 MED ORDER — POLYETHYLENE GLYCOL 3350 17 G PO PACK
17.0000 g | PACK | Freq: Two times a day (BID) | ORAL | Status: DC
  Administered 2022-04-09 – 2022-04-11 (×4): 17 g
  Filled 2022-04-09 (×4): qty 1

## 2022-04-09 MED ORDER — SENNA 8.6 MG PO TABS
1.0000 | ORAL_TABLET | Freq: Two times a day (BID) | ORAL | Status: DC
  Administered 2022-04-09 – 2022-04-11 (×4): 8.6 mg
  Filled 2022-04-09 (×4): qty 1

## 2022-04-09 MED ORDER — DIVALPROEX SODIUM 125 MG PO CSDR
125.0000 mg | DELAYED_RELEASE_CAPSULE | Freq: Two times a day (BID) | ORAL | Status: DC
  Administered 2022-04-09 – 2022-04-11 (×5): 125 mg via ORAL
  Filled 2022-04-09 (×7): qty 1

## 2022-04-09 MED ORDER — DEXTROSE 5 % IV SOLN: INTRAVENOUS | Status: DC

## 2022-04-09 MED ORDER — POLYETHYLENE GLYCOL 3350 17 G PO PACK: 17.0000 g | PACK | Freq: Every day | ORAL | Status: DC

## 2022-04-09 MED ORDER — FREE WATER
200.0000 mL | Status: DC
  Administered 2022-04-09 – 2022-04-11 (×24): 200 mL

## 2022-04-09 NOTE — Consult Note (Cosign Needed Addendum)
[place] Psychiatry Consult   Reason for Consult:  Bradley Tran  Referring Physician:  Medication management for agitation/delirium with hx fo [place] Patient Identification: Bradley Tran:  [id number] Principal Diagnosis: Acute respiratory failure with hypoxia [facility]) Diagnosis:  Principal Problem:   Acute respiratory failure with hypoxia ([place]) Active Problems:   DM (diabetes mellitus), type 2 with renal complications ([name])   CKD (chronic kidney disease) stage 4, GFR 15-29 ml/min ([name])   Bipolar disorder, unspecified ([name])   Gastroesophageal reflux disease   Hypothyroidism   COVID-19   Protein-calorie malnutrition, severe   Total Time spent with patient: 15 minutes  Subjective:   Bradley Tran is a 72 y.o. male seen and evaluated face-to-face by this provider.  Patient presents irritable and irate throughout this assessment.  Patient can be difficult to understand  due to poor detention.  patient went on a tangent related to  " I know you are not going to evaluated me because my my brother is a Control and instrumentation engineer."  Reported " I don't want to talk to you nigger."  Patient requested that this provider leave his room. Per nursing staff patient has been patient has been calmer after this morning's medication pass.  During evaluation Bradley Tran is sitting in bed, isolation precautions due to COVID results.  Bradley Tran is alert/oriented x 2; irritable and uncooperative; patient presented with mood lability and verbally aggressive. Patient is speaking in a clear tone at moderate volume, and is slightly pressured pace; with fair eye contact. His thought process is  can be incoherent and irrelevant tangents related to his brother and the reason for his admission.  There is no indication that he is currently responding to internal/external stimuli or experiencing delusional thought content.  Patient denies suicidal/self-harm/homicidal ideation. Patient was agitated and demanded that this provider leave his  room.  Case staffed with attending psychiatrist MD Dwyane Dee.  Will initiate Depakote 125 mg p.o. twice daily for mood stabilization.  Orders placed for valproic acid 2022-05-01.  Psychiatry will continue to follow.   Chart review patient has a history of bipolar disorder with acute agitation, generalized anxiety disorder, and delirium. Patient is currently prescribed Seroquel 200 mg p.o. 3 times daily-continue to monitor EKG. Last 2/29- 452/458 Qtc  interval.  Continue to monitor EKG QTc   Hepatic Function Panel:  AST/ALT 14/15, Magnesium 2.6, Creatinine 5.48, Vitamin B-12 1,171   Head CT resutls 01/11/2022  "IMPRESSION 1. No acute intracranial abnormality. No calvarial fracture.2. No acute fracture or listhesis of the cervical spine.3. 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  HPI: per admission assessment note:  "Patient is a 72 year old male with pertinent PMH COPD, bipolar, DMT2, hypothyroidism, PE in nov 2022 no longer on ac presents to [facility] ED on 2/23 with acute respiratory failure with hypoxia."   Past Psychiatric History:   Risk to Self:   Risk to Others:   Prior Inpatient Therapy:   Prior Outpatient Therapy:    Past Medical History:  Past Medical History:  Diagnosis Date   Acute renal failure ([place])    Bipolar disorder, unspecified ([place])    Diabetes mellitus    Hyperlipidemia    Hypothyroidism     Past Surgical History:  Procedure Laterality Date   OTHER SURGICAL HISTORY     lymph node removed from right arm   Family History: No family history on file. Family Psychiatric  History:  Social History:  Social History  Substance and Sexual Activity  Alcohol Use Not Currently   Comment: last night. daily     Social History   Substance and Sexual Activity  Drug Use Not Currently    Social History   Socioeconomic History   Marital status: Single     Spouse name: Not on file   Number of children: Not on file   Years of education: Not on file   Highest education level: Not on file  Occupational History   Occupation: retired Cabin crew  Tobacco Use   Smoking status: Former    Packs/day: 4.00    Years: 30.00    Total pack years: 120.00    Types: Cigarettes    Quit date: 06/11/2008    Years since quitting: 13.8   Smokeless tobacco: Not on file  Substance and Sexual Activity   Alcohol use: Not Currently    Comment: last night. daily   Drug use: Not Currently   Sexual activity: Not Currently  Other Topics Concern   Not on file  Social History Narrative   Not on file   Social Determinants of Health   Financial Resource Strain: Not on file  Food Insecurity: Not on file  Transportation Needs: Not on file  Physical Activity: Not on file  Stress: Not on file  Social Connections: Not on file   Additional Social History:    Allergies:   Allergies  Allergen Reactions   Budesonide-Formoterol Fumarate Swelling    [facility]   Trifluoperazine Other (See Comments)    Muscle aches    Labs:  Results for orders placed or performed during the hospital encounter of 03/17/2022 (from the past 48 hour(s))  Glucose, capillary     Status: Abnormal   Collection Time: 04/07/22  3:18 PM  Result Value Ref Range   Glucose-Capillary 123 (H) 70 - 99 mg/dL    Comment: Glucose reference range applies only to samples taken after fasting for at least 8 hours.  Glucose, capillary     Status: Abnormal   Collection Time: 04/07/22  7:20 PM  Result Value Ref Range   Glucose-Capillary 116 (H) 70 - 99 mg/dL    Comment: Glucose reference range applies only to samples taken after fasting for at least 8 hours.  Glucose, capillary     Status: Abnormal   Collection Time: 04/07/22 11:18 PM  Result Value Ref Range   Glucose-Capillary 177 (H) 70 - 99 mg/dL    Comment: Glucose reference range applies only to samples taken after  fasting for at least 8 hours.  Glucose, capillary     Status: None   Collection Time: 04/08/22  3:24 AM  Result Value Ref Range   Glucose-Capillary 92 70 - 99 mg/dL    Comment: Glucose reference range applies only to samples taken after fasting for at least 8 hours.  Glucose, capillary     Status: None   Collection Time: 04/08/22  7:31 AM  Result Value Ref Range   Glucose-Capillary 79 70 - 99 mg/dL    Comment: Glucose reference range applies only to samples taken after fasting for at least 8 hours.  Hepatic function panel     Status: Abnormal   Collection Time: 04/08/22  8:26 AM  Result Value Ref Range   Total Protein 6.1 (L) 6.5 - 8.1 g/dL   Albumin 3.1 (L) 3.5 - 5.0 g/dL   AST 14 (L) 15 - 41 U/L   ALT 15 0 - 44 U/L   Alkaline  Phosphatase 63 38 - 126 U/L   Total Bilirubin 0.7 0.3 - 1.2 mg/dL   Bilirubin, Direct <0.1 0.0 - 0.2 mg/dL   Indirect Bilirubin NOT CALCULATED 0.3 - 0.9 mg/dL    Comment: Performed at [facility] [address] [place], [place] [address]  CBC     Status: Abnormal   Collection Time: 04/08/22  8:33 AM  Result Value Ref Range   WBC 10.7 (H) 4.0 - 10.5 K/uL   RBC 2.42 (L) 4.22 - 5.81 MIL/uL   Hemoglobin 7.4 (L) 13.0 - 17.0 g/dL   HCT 24.9 (L) 39.0 - 52.0 %   MCV 102.9 (H) 80.0 - 100.0 fL   MCH 30.6 26.0 - 34.0 pg   MCHC 29.7 (L) 30.0 - 36.0 g/dL   RDW 14.5 11.5 - 15.5 %   Platelets 126 (L) 150 - 400 K/uL   nRBC 0.0 0.0 - 0.2 %    Comment: Performed at [facility], [place] [address] [place], [place] [address]  Magnesium     Status: Abnormal   Collection Time: 04/08/22  8:33 AM  Result Value Ref Range   Magnesium 2.6 (H) 1.7 - 2.4 mg/dL    Comment: Performed at [facility] [address] [place], [place] [address]  Phosphorus     Status: Abnormal   Collection Time: 04/08/22  8:33 AM  Result Value Ref Range   Phosphorus 8.2 (H) 2.5 - 4.6 mg/dL    Comment: Performed at [facility] [address] [place], [place] [id number]   Basic metabolic panel     Status: Abnormal   Collection Time: 04/08/22  8:33 AM  Result Value Ref Range   Sodium 153 (H) 135 - 145 mmol/L   Potassium 4.7 3.5 - 5.1 mmol/L   Chloride 118 (H) 98 - 111 mmol/L   CO2 23 22 - 32 mmol/L   Glucose, Bld 111 (H) 70 - 99 mg/dL    Comment: Glucose reference range applies only to samples taken after fasting for at least 8 hours.   BUN 142 (H) 8 - 23 mg/dL   Creatinine, Ser 5.48 (H) 0.61 - 1.24 mg/dL   Calcium 8.7 (L) 8.9 - 10.3 mg/dL   GFR, Estimated 11 (L) >60 mL/min    Comment: (NOTE) Calculated using the CKD-EPI Creatinine Equation (2021)    Anion gap 12 5 - 15    Comment: Performed at [facility] [address], [place], [place] [address]  Ammonia     Status: None   Collection Time: 04/08/22 10:58 AM  Result Value Ref Range   Ammonia <10 9 - 35 umol/L    Comment: Performed at [facility], [place] [address], [place], [place] [address]  Glucose, capillary     Status: Abnormal   Collection Time: 04/08/22 11:19 AM  Result Value Ref Range   Glucose-Capillary 103 (H) 70 - 99 mg/dL    Comment: Glucose reference range applies only to samples taken after fasting for at least 8 hours.  Glucose, capillary     Status: Abnormal   Collection Time: 04/08/22  3:37 PM  Result Value Ref Range   Glucose-Capillary 131 (H) 70 - 99 mg/dL    Comment: Glucose reference range applies only to samples taken after fasting for at least 8 hours.  Magnesium     Status: Abnormal   Collection Time: 04/08/22  6:36 PM  Result Value Ref Range   Magnesium 2.6 (  H) 1.7 - 2.4 mg/dL    Comment: Performed at [facility], [place] [address], [place], [place] [address]  Phosphorus     Status: Abnormal   Collection Time: 04/08/22  6:36 PM  Result Value Ref Range   Phosphorus 8.3 (H) 2.5 - 4.6 mg/dL    Comment: Performed at [facility] [address] [place], [place] [address]  Glucose, capillary     Status: Abnormal   Collection Time: 04/08/22  7:48  PM  Result Value Ref Range   Glucose-Capillary 141 (H) 70 - 99 mg/dL    Comment: Glucose reference range applies only to samples taken after fasting for at least 8 hours.  Glucose, capillary     Status: Abnormal   Collection Time: 04/08/22 11:24 PM  Result Value Ref Range   Glucose-Capillary 145 (H) 70 - 99 mg/dL    Comment: Glucose reference range applies only to samples taken after fasting for at least 8 hours.  Glucose, capillary     Status: Abnormal   Collection Time: 04/09/22  3:22 AM  Result Value Ref Range   Glucose-Capillary 156 (H) 70 - 99 mg/dL    Comment: Glucose reference range applies only to samples taken after fasting for at least 8 hours.  Glucose, capillary     Status: Abnormal   Collection Time: 04/09/22  7:47 AM  Result Value Ref Range   Glucose-Capillary 128 (H) 70 - 99 mg/dL    Comment: Glucose reference range applies only to samples taken after fasting for at least 8 hours.  Magnesium     Status: None   Collection Time: 04/09/22  9:32 AM  Result Value Ref Range   Magnesium 2.4 1.7 - 2.4 mg/dL    Comment: Performed at [facility], [place] [address], [place], [place] [address]  Phosphorus     Status: Abnormal   Collection Time: 04/09/22  9:32 AM  Result Value Ref Range   Phosphorus 7.6 (H) 2.5 - 4.6 mg/dL    Comment: Performed at [facility] [address], [place], [place] [id number]  Basic metabolic panel     Status: Abnormal   Collection Time: 04/09/22  9:32 AM  Result Value Ref Range   Sodium 156 (H) 135 - 145 mmol/L   Potassium 5.0 3.5 - 5.1 mmol/L   Chloride 118 (H) 98 - 111 mmol/L   CO2 25 22 - 32 mmol/L   Glucose, Bld 153 (H) 70 - 99 mg/dL    Comment: Glucose reference range applies only to samples taken after fasting for at least 8 hours.   BUN 144 (H) 8 - 23 mg/dL   Creatinine, Ser 5.60 (H) 0.61 - 1.24 mg/dL   Calcium 8.4 (L) 8.9 - 10.3 mg/dL   GFR, Estimated 10 (L) >60 mL/min    Comment: (NOTE) Calculated using the CKD-EPI  Creatinine Equation (2021)    Anion gap 13 5 - 15    Comment: Performed at [facility] [address] [place], [place] [address]  CBC     Status: Abnormal   Collection Time: 04/09/22  9:32 AM  Result Value Ref Range   WBC 8.7 4.0 - 10.5 K/uL   RBC 2.37 (L) 4.22 - 5.81 MIL/uL   Hemoglobin 7.3 (L) 13.0 - 17.0 g/dL   HCT 24.3 (L) 39.0 - 52.0 %   MCV 102.5 (H) 80.0 - 100.0 fL   MCH 30.8 26.0 - 34.0 pg   MCHC 30.0  30.0 - 36.0 g/dL   RDW 14.6 11.5 - 15.5 %   Platelets 101 (L) 150 - 400 K/uL    Comment: Immature Platelet Fraction may be clinically indicated, consider ordering this additional test [id number] REPEATED TO VERIFY    nRBC 0.0 0.0 - 0.2 %    Comment: Performed at [facility], [place] [address] [place], [place] [address]  Vitamin B12     Status: Abnormal   Collection Time: 04/09/22  9:32 AM  Result Value Ref Range   Vitamin B-12 1,171 (H) 180 - 914 pg/mL    Comment: (NOTE) This assay is not validated for testing neonatal or myeloproliferative syndrome specimens for Vitamin B12 levels. Performed at [facility], [place] [address], [place], [place] [address]   Folate     Status: None   Collection Time: 04/09/22  9:32 AM  Result Value Ref Range   Folate 8.9 >5.9 ng/mL    Comment: Performed at [facility] [address] [place], [place] [address]  Iron and TIBC     Status: Abnormal   Collection Time: 04/09/22 10:18 AM  Result Value Ref Range   Iron 104 45 - 182 ug/dL   TIBC 153 (L) 250 - 450 ug/dL   Saturation Ratios 68 (H) 17.9 - 39.5 %   UIBC 49 ug/dL    Comment: Performed at [facility] [address] [place], [place] [address]  Ferritin     Status: None   Collection Time: 04/09/22 10:18 AM  Result Value Ref Range   Ferritin 330 24 - 336 ng/mL    Comment: Performed at [facility] [address], [place], [place] [address]  Glucose, capillary     Status: Abnormal   Collection Time: 04/09/22 11:22 AM  Result Value Ref  Range   Glucose-Capillary 140 (H) 70 - 99 mg/dL    Comment: Glucose reference range applies only to samples taken after fasting for at least 8 hours.    Current Facility-Administered Medications  Medication Dose Route Frequency Provider Last Rate Last Admin   arformoterol (BROVANA) nebulizer solution 15 mcg  15 mcg Nebulization BID Simonne Maffucci B, MD   15 mcg at 04/09/22 1137   bethanechol (URECHOLINE) tablet 10 mg  10 mg Per Tube TID Bradley Tran D, NP       Chlorhexidine Gluconate Cloth 2 % PADS 6 each  6 each Topical Daily Collier Bullock, MD   6 each at 04/08/22 1610   doxycycline (VIBRA-TABS) tablet 100 mg  100 mg Per Tube Q12H Wilson Singer I, RPH   100 mg at 04/09/22 [id number]   feeding supplement (OSMOLITE 1.2 CAL) liquid 1,000 mL  1,000 mL Per Tube Continuous Juanito Doom, MD 65 mL/hr at 04/09/22 0600 Infusion Verify at 04/09/22 0600   free water 200 mL  200 mL Per Tube Q2H Harris, Whitney D, NP   200 mL at 04/09/22 1213   heparin injection 5,000 Units  5,000 Units Subcutaneous Q8H Gleason, Otilio Carpen, PA-C   5,000 Units at 04/09/22 [id number]   hydrOXYzine (ATARAX) tablet 10 mg  10 mg Per Tube BID Wilson Singer I, RPH   10 mg at 04/09/22 [id number]   insulin aspart (novoLOG) injection 0-15 Units  0-15 Units Subcutaneous Q4H Simonne Maffucci B, MD   2 Units at 04/09/22 1221   ipratropium-albuterol (DUONEB) 0.5-2.5 (3) MG/3ML nebulizer solution 3 mL  3 mL Nebulization Q6H PRN Juanito Doom, MD  levothyroxine (SYNTHROID) tablet 100 mcg  100 mcg Per Tube Q0600 Wilson Singer I, RPH   100 mcg at 04/09/22 [id number]   Oral care mouth rinse  15 mL Mouth Rinse PRN Juanito Doom, MD       Oral care mouth rinse  15 mL Mouth Rinse PRN Juanito Doom, MD       polyethylene glycol (MIRALAX / GLYCOLAX) packet 17 g  17 g Per Tube Daily PRN Wilson Singer I, RPH       polyethylene glycol (MIRALAX / GLYCOLAX) packet 17 g  17 g Per Tube BID Bradley Tran D, NP        QUEtiapine (SEROQUEL) tablet 200 mg  200 mg Per Tube TID Gleason, Otilio Carpen, PA-C   200 mg at 04/09/22 [id number]   revefenacin (YUPELRI) nebulizer solution 175 mcg  175 mcg Nebulization Daily Simonne Maffucci B, MD   175 mcg at 04/09/22 1136   senna (SENOKOT) tablet 8.6 mg  1 tablet Per Tube BID Bradley Tran D, NP       sterile water (preservative free) injection            thiamine (VITAMIN B1) tablet 100 mg  100 mg Per Tube Daily Simonne Maffucci B, MD   100 mg at 04/09/22 [id number]   ziprasidone (GEODON) injection 10 mg  10 mg Intramuscular Q2H PRN Juanito Doom, MD   10 mg at 04/08/22 1005    Musculoskeletal:   Psychiatric Specialty Exam:  Presentation  General Appearance: Disheveled  Eye Contact:Good  Speech:Clear and Coherent; Pressured  Speech Volume:Decreased  Handedness:Right   Mood and Affect  Mood:Irritable; Labile  Affect:Labile; Inappropriate   Thought Process  Thought Processes:Linear  Descriptions of Associations:Loose  Orientation:Full (Time, Place and Person)  Thought Content:Rumination  History of Schizophrenia/Schizoaffective disorder: Unable to assess Duration of Psychotic Symptoms: Unable to assess Hallucinations:Hallucinations: Other (comment)  Ideas of Reference:None  Suicidal Thoughts:Suicidal Thoughts: No  Homicidal Thoughts:Homicidal Thoughts: No   Sensorium  Memory:Immediate Poor; Recent Poor; Remote Poor  Judgment:Poor  Insight:Poor   Executive Functions  Concentration:Poor  Attention Span:Poor  Recall:Poor  Fund of Knowledge:Poor  Language:Poor   Psychomotor Activity  Psychomotor Activity:Psychomotor Activity: Other (comment); Psychomotor Retardation (resting in bed)   Assets  Assets:Other (comment)   Sleep  Sleep:Sleep: Poor   Physical Exam: Physical Exam Vitals and nursing note reviewed.  Psychiatric:        Mood and Affect: Mood is anxious.        Behavior: Behavior is agitated.    Review of Systems   Psychiatric/Behavioral:  Negative for suicidal ideas. The patient is nervous/anxious and has insomnia.    Blood pressure 95/77, pulse (!) 108, temperature 97.7 F (36.5 C), temperature source Axillary, resp. rate (!) 27, weight 53.7 kg, SpO2 92 %. Body mass index is 20.32 kg/m.  Treatment Plan Summary: Daily contact with patient to assess and evaluate symptoms and progress in treatment and Medication management  Initiated Depakote sprinkles 125 mg p.o. twice daily Valproic acid to be collected 2022-04-13 Continue Seroquel 200 mg p.o. 3 times daily Continue Atarax 10 mg p.o. twice daily as needed  Disposition:  Chart reviewed:  Hepatic Function Panel:  AST/ALT 14/15, Magnesium 2.6, Creatinine 5.48, Vitamin B-12 1,171  Psychiatry to continue to follow  [place]ill [place], NP 04/09/2022 12:48 PM

## 2022-04-09 NOTE — Progress Notes (Signed)
Speech Language Pathology Treatment: Dysphagia  Patient Details Name: Bradley Tran MRN: [id number] DOB: April 24, 1950 Today's Date: 04/09/2022 Time: [id number] SLP Time Calculation (min) (ACUTE ONLY): 14 min  Assessment / Plan / Recommendation Clinical Impression  Pt seen for ongoing dysphagia management.  Pt awake, alert, pleasant.  SLP brought morning meal tray.  SLP provided oral care prior to administration of PO trials.  Pt with clean, moist, pink, oral mucosa. Pt consumed puree without any clinical s/s of aspiration.  With cup sip of water pt required 2 swallows and there was immediate throat clear.  Pt stated he drank to fast.  Straw was given for pt to control bolus flow.  There was intermittent wet cough noted after straw sips throughout session.  RN reports frequent cough with water as well.  Recommend instrumental swallow study to further assess pharyngeal swallow function.  MBSS tentatively planned for later this date pending radiology availability (currently with outage in one room).    Recommend continuing puree diet.  Pt may have small sips of water and ice chips only pending MBSS.    HPI HPI: Patient is a 72 year old male presented to [facility] ED on 2/23 with acute respiratory failure with hypoxia after having been d/c'd from [facility] earlier in the day when he would not allow workup. COVID+  CXR 2/23 and 2/24 without acute findings.  Pt required ETT 2/24-2/26, just under 48 hours. Pt with pertinent PMH COPD, bipolar, DMT2, hypothyroidism, PE in nov 2022 no longer on ac.      SLP Plan  MBS      Recommendations for follow up therapy are one component of a multi-disciplinary discharge planning process, led by the attending physician.  Recommendations may be updated based on patient status, additional functional criteria and insurance authorization.    Recommendations  Diet recommendations: Dysphagia 1 (puree) Medication Administration: Whole meds with puree Supervision: Staff to assist with  self feeding Compensations: Minimize environmental distractions Postural Changes and/or Swallow Maneuvers: Seated upright 90 degrees                Oral Care Recommendations: Oral care BID SLP Visit Diagnosis: Dysphagia, unspecified (R13.10) Plan: [place], [place], [place] Office: [phone] 04/09/2022, 10:28 AM

## 2022-04-09 NOTE — Progress Notes (Signed)
PT Cancellation Note  Patient Details Name: Bradley Tran MRN: [id number] DOB: [birth year]   Cancelled Treatment:    Reason Eval/Treat Not Completed: Patient declined, no reason specified - pt restless and awaiting medications later this afternoon, advised to check back tomorrow.  Stacie Glaze, PT DPT Acute Rehabilitation Services Pager [phone]  Office [phone]    [place] 04/09/2022, 3:00 PM

## 2022-04-09 NOTE — Progress Notes (Addendum)
Bradley Tran PROGRESS NOTE  Assessment/ Plan: Pt is a 72 y.o. yo male with a history of bipolar mood disorder, DM, COPD, homelessness, advanced longstanding CKD who was presented with acute respiratory failure with hypoxia seen as a consultation for CKD and electrolytes abnormalities.  # Acute kidney injury on CKD stage IV with azotemia: He has longstanding advanced CKD with creatinine level around 3-4.  AKI presumably hemodynamically mediated with dehydration.  He also has hypernatremia.  High BUN can also be contributed by recent steroid use.  He is receiving free water from the tube and had around 2 L of urine output.  Awaiting the lab results from this morning.  Given his comorbidities, agitation, he may not be a good candidate for long-term dialysis unless significant change in his condition gradually.  We will continue to follow.  Continue daily lab and strict ins and out.  # Hypernatremia with free water deficit: He looks dry on exam.  Currently on free water from the tube.  It would be nice if he can drink water.  Follow lab.  # Anemia: Check iron level.  Monitor hemoglobin.  # CKD-MBD-hyperphosphatemia: Check PTH level, start sevelamer when he takes orally.  # Hypotension: Blood pressure running low.  Not on antihypertensive.    # Bipolar disorder with acute agitation: He was calmer this morning however really not cooperating with exam and not able to get history from him.  Seems like the mental status is gradually improving with his antipsych medications.  Addendum:  Labs reviewed, worsen kidney function and Na level. Will start hypotonic fluid, hopefully he can get free water. Rec: palliative care discussion for GOC.  Subjective: The patient was seen and examined in ICU.  Urine output is recorded around 2 L.  No lab available from this morning to review.  Reportedly he is more calmer today than yesterday.  Not able to get history from him. Objective Vital  signs in last 24 hours: Vitals:   04/09/22 0630 04/09/22 0750 04/09/22 0800 04/09/22 0809  BP: 98/75  (!) 87/67   Pulse: 95  96   Resp: 17  19   Temp:  98.5 F (36.9 C)    TempSrc:  Axillary    SpO2: 96%  93%   Weight:    53.7 kg   Weight change:   Intake/Output Summary (Last 24 hours) at 04/09/2022 0920 Last data filed at 04/09/2022 0600 Gross per 24 hour  Intake 1514.89 ml  Output 2000 ml  Net -485.11 ml       Labs: RENAL PANEL Recent Labs  Lab 04/06/2022 2136 03/21/2022 2150 04/03/22 0538 04/03/22 1208 04/04/22 0101 04/04/22 0351 04/04/22 1107 04/04/22 1636 04/05/22 0705 04/06/22 0301 04/06/22 1116 04/06/22 1631 04/08/22 0826 04/08/22 0833 04/08/22 1836  NA 143   < > 142   < > 144 145  --  145 146* 149*  --   --   --  153*  --   K 3.8   < > 4.0   < > 3.8 4.6  --  4.5 3.9 4.7  --   --   --  4.7  --   CL 111   < > 111   < > 110  --   --  106 110 111  --   --   --  118*  --   CO2 19*  --  16*   < > 18*  --   --  19* 21* 19*  --   --   --  23  --   GLUCOSE 137*   < > 181*   < > 133*  --   --  234* 115* 105*  --   --   --  111*  --   BUN 79*   < > 83*   < > 86*  --   --  96* 114* 132*  --   --   --  142*  --   CREATININE 6.57*   < > 6.47*   < > 6.04*  --   --  5.89* 5.80* 5.88*  --   --   --  5.48*  --   CALCIUM 8.4*  --  8.2*   < > 8.1*  --   --  8.2* 8.1* 8.4*  --   --   --  8.7*  --   MG  --    < >  --   --   --   --    < > 1.8 1.9 2.6* 2.5* 2.5*  --  2.6* 2.6*  PHOS  --   --   --   --   --   --    < > 4.9* 5.6* 8.1* 8.5* 8.5*  --  8.2* 8.3*  ALBUMIN 3.9  --  3.6  --   --   --   --   --   --  2.9*  --   --  3.1*  --   --    < > = values in this interval not displayed.    Liver Function Tests: Recent Labs  Lab 04/03/22 0538 04/06/22 0301 04/08/22 0826  AST 12* 13* 14*  ALT '18 14 15  '$ ALKPHOS 74 58 63  BILITOT 0.6 0.6 0.7  PROT 7.1 5.8* 6.1*  ALBUMIN 3.6 2.9* 3.1*   No results for input(s): "LIPASE", "AMYLASE" in the last 168 hours. Recent Labs  Lab  04/08/22 1058  AMMONIA <10   CBC: Recent Labs    04/04/22 0101 04/04/22 0351 04/05/22 0705 04/06/22 0301 04/08/22 0833  HGB 7.1* 7.5* 7.8* 7.9* 7.4*  MCV 97.0  --  97.3 100.4* 102.9*    Cardiac Enzymes: No results for input(s): "CKTOTAL", "CKMB", "CKMBINDEX", "TROPONINI" in the last 168 hours. CBG: Recent Labs  Lab 04/08/22 1537 04/08/22 1948 04/08/22 2324 04/09/22 0322 04/09/22 0747  GLUCAP 131* 141* 145* 156* 128*    Iron Studies: No results for input(s): "IRON", "TIBC", "TRANSFERRIN", "FERRITIN" in the last 72 hours. Studies/Results: DG Abd Portable 1V  Result Date: 04/07/2022 CLINICAL DATA:  Encounter for feeding tube placement. EXAM: PORTABLE ABDOMEN - 1 VIEW COMPARISON:  None Available. FINDINGS: Tip of the weighted enteric tube is in the right abdomen in the region of the distal stomach. There is gaseous gastric distension. No small bowel dilatation. IMPRESSION: Tip of the weighted enteric tube in the right abdomen in the region of the distal stomach. Gaseous gastric distension. Electronically Signed   By: Keith Rake M.D.   On: 04/07/2022 15:19    Medications: Infusions:  dexmedetomidine (PRECEDEX) IV infusion Stopped (04/09/22 0407)   feeding supplement (OSMOLITE 1.2 CAL) 65 mL/hr at 04/09/22 0600    Scheduled Medications:  arformoterol  15 mcg Nebulization BID   Chlorhexidine Gluconate Cloth  6 each Topical Daily   doxycycline  100 mg Per Tube Q12H   free water  200 mL Per Tube Q4H   heparin injection (subcutaneous)  5,000 Units Subcutaneous Q8H   hydrOXYzine  10 mg Per Tube BID  insulin aspart  0-15 Units Subcutaneous Q4H   levothyroxine  100 mcg Per Tube Q0600   mouth rinse  15 mL Mouth Rinse 4 times per day   polyethylene glycol  17 g Oral Daily   QUEtiapine  200 mg Per Tube TID   revefenacin  175 mcg Nebulization Daily   senna  1 tablet Per Tube Daily   sterile water (preservative free)       thiamine  100 mg Per Tube Daily    have reviewed  scheduled and prn medications.  Physical Exam: General: Elderly looking male, has feeding tube and confused, dry oral mucous membrane Heart:RRR, s1s2 nl Lungs:clear b/l, no crackle Abdomen:soft, Non-tender, non-distended Extremities:No edema Neurology: Confused male  [place] 04/09/2022,9:20 AM  LOS: 6 days

## 2022-04-09 NOTE — Progress Notes (Addendum)
NAME:  Bradley Tran, MRN:  [id number], DOB:  [birth year], LOS: 6 ADMISSION DATE:  03/14/2022, CONSULTATION DATE:  2/23 REFERRING MD:  Dr. Francia Greaves, CHIEF COMPLAINT: Acute respiratory failure with hypoxia  History of Present Illness:  Patient is a 72 year old male with pertinent PMH COPD, bipolar, DMT2, hypothyroidism, PE in nov 2022 no longer on ac presents to [facility] ED on 2/23 with acute respiratory failure with hypoxia.  Patient is homeless.  On 2/23 patient came to [facility] long ED for difficulty breathing.  Patient was belligerent and cussing at staff.  Patient was unwilling to have workup done and was sent home.  Later today patient was witnessed by bystanders having some increased SOB.  EMS called.  Patient tachypneic with sats 60-70% on room air.  Distant breath sounds.  Given breathing treatments, mag, and IV steroids.   Transferred to [facility] ED.  Upon arrival to [facility] ED patient still in respiratory distress.  Patient somnolent but arousable.  States he feels slightly better with breathing treatments.  States that a week ago he was diagnosed with COVID.  He has productive cough with bodyaches.  Vital stable.  Tmax 99.3 F.  VBG showing 7.15, 58, 145, 20.5.  Patient placed on BiPAP.  COVID-positive.  Cultures obtained.  CXR with no significant findings. Patient had episode of agitation ripping off bipap and pulling out iv's. Given ativan. PCCM consulted for icu admission.  Pertinent ED labs: WBC 7.1, Hgb 9, ethanol WNL, UDS and UA pending, creat 6.57 (baseline 4), BUN 79, GFR 9, troponin 9, bnp 158.  Pertinent  Medical History   Past Medical History:  Diagnosis Date   Acute renal failure ([place])    Bipolar disorder, unspecified ([place])    Diabetes mellitus    Hyperlipidemia    Hypothyroidism     Significant Hospital Events: Including procedures, antibiotic start and stop dates in addition to other pertinent events   2/23 admitted to [facility] resp failure on bipap 2/24 Intubated 2/26 No acute events  overnight, remains on vent 40 FIO2, low min ventilation on SBT trial this am  2/27 extubated successfully yesterday, agitated overnight requiring precedex and haldol 2/28 still on precedex this morning, passed swallow 2/29 unable to wean off precedex 3/1 off precedex gtt, verbally and physically aggressive, psych consult   Interim History / Subjective:  As above   Objective   Blood pressure 98/75, pulse 95, temperature 98.5 F (36.9 C), temperature source Axillary, resp. rate 17, weight 53.8 kg, SpO2 96 %.        Intake/Output Summary (Last 24 hours) at 04/09/2022 0753 Last data filed at 04/09/2022 0600 Gross per 24 hour  Intake 1551.52 ml  Output 2000 ml  Net -448.48 ml    Filed Weights   04/06/22 0407 04/07/22 0417 04/08/22 0353  Weight: 54.5 kg 53.7 kg 53.8 kg    General:  Chronically ill male lying in bed  HEENT: [place], EOM intact, PERRLA intact, Pink MM  Neuro: Alert and orientation questionable, pt refusing to answer questions CV: s1 and s2 auscultated, RRR, no m/r/g PULM: CTA BL, diminished in lower bases  GI: BS active, cortrak  Extremities: Moves all extremities with ease Skin: dry, no lesions   Resolved Hospital Problem list   Prolonged qtc  Assessment & Plan:   Acute respiratory failure with hypoxia/hypercarbia COPD exacerbation COVID positive -extubated 2/26, still requiring [place] 2 Liters  P: Continue to wean O2  Continue yupelri, brovana and duonebs Continue doxy stop tonight, total of 7 days  Airborne precautions   Bipolar Disorder with Acute Agitation -Ammonia <10 On seroquel '200mg'$  TID at baseline  P: Continue to utilize PRN geodon if needed Continue to monitor qTCs  Consult psych  On home dose Seroquel as of 2/29  Type 2 diabetes  -Hgb1c 6.5 P: Continue SSI for CBG goal 140-180 Continue to hold home long acting   Hx PE 12/2020  -Self discontinued anticoagulation 03/2021.  Risk for VTE in setting of COVID -Stopped heparin gtt 2/29- unable to  obtain CTA 2/2 renal function no dvt, echo reassuring no R heart strain, heparin stopped yesterday P: Continue Sbq heparin, no benefit seen to resuming DOAC Continue SCDS   AKI on CKD 4  -Hypernatremia  -Creatinine 5.48 from 5.88  P: Continue Monitor urine output, Trend Bmet, Avoid nephrotoxins Continue  adequate renal perfusion  May need nephro consult  Continue free water, consider increasing to Q2   Urinary retention -Coude foley placed 2/24 P: Start Urecholine 3/1 Plan to start voiding trial 3/2  Hx of HTN/HLD  -Remains borderline hypotensive to normotensive  P: Continue to follow VS and Continuous telemetry  Hold statin for now   Anemia Thrombocytopenia  -Hgb 7.9 from 7.8  -Plts 138 from 149  P: Continue to monitor CBC Transfuse if hgb <7  Monitor for bleeding   Hypothyroidism P: Continue PO Synthroid   Constipation  P: Increase bowel regiment    Medication noncompliance  -Refusing medications, interventions in setting of underlying psych hx and acute illness  P: Psych consult  Continue to provide medical interventions and medications as pt will allow   Best Practice (right click and "Reselect all SmartList Selections" daily)   Diet/type: clears, passed swallow,  DVT prophylaxis: prophylactic heparin  GI prophylaxis: N/A Lines: N/A Foley:  N/A Code Status:  full code Last date of multidisciplinary goals of care discussion [no contact on file]  Critical care time:  CRITICAL CARE Performed by: Kristalyn Bergstresser D. Harris  Total critical care time: 35 minutes  Critical care time was exclusive of separately billable procedures and treating other patients.  Critical care was necessary to treat or prevent imminent or life-threatening deterioration.  Critical care was time spent personally by me on the following activities: development of treatment plan with patient and/or surrogate as well as nursing, discussions with consultants, evaluation of patient's  response to treatment, examination of patient, obtaining history from patient or surrogate, ordering and performing treatments and interventions, ordering and review of laboratory studies, ordering and review of radiographic studies, pulse oximetry and re-evaluation of patient's condition.  Retta Pitcher D. Harris, NP-C [facility] Personal contact information can be found on Amion  If no contact or response made please call [phone] 04/09/2022, 12:11 PM \

## 2022-04-09 DEATH — deceased

## 2022-04-10 ENCOUNTER — Inpatient Hospital Stay (HOSPITAL_COMMUNITY): Payer: Medicare Other

## 2022-04-10 DIAGNOSIS — E1122 Type 2 diabetes mellitus with diabetic chronic kidney disease: Secondary | ICD-10-CM

## 2022-04-10 DIAGNOSIS — R451 Restlessness and agitation: Secondary | ICD-10-CM | POA: Insufficient documentation

## 2022-04-10 DIAGNOSIS — E039 Hypothyroidism, unspecified: Secondary | ICD-10-CM

## 2022-04-10 DIAGNOSIS — D638 Anemia in other chronic diseases classified elsewhere: Secondary | ICD-10-CM | POA: Insufficient documentation

## 2022-04-10 DIAGNOSIS — K5900 Constipation, unspecified: Secondary | ICD-10-CM | POA: Insufficient documentation

## 2022-04-10 DIAGNOSIS — N179 Acute kidney failure, unspecified: Secondary | ICD-10-CM

## 2022-04-10 DIAGNOSIS — E43 Unspecified severe protein-calorie malnutrition: Secondary | ICD-10-CM

## 2022-04-10 DIAGNOSIS — Z794 Long term (current) use of insulin: Secondary | ICD-10-CM

## 2022-04-10 DIAGNOSIS — J441 Chronic obstructive pulmonary disease with (acute) exacerbation: Secondary | ICD-10-CM | POA: Diagnosis not present

## 2022-04-10 DIAGNOSIS — J9601 Acute respiratory failure with hypoxia: Secondary | ICD-10-CM | POA: Diagnosis not present

## 2022-04-10 DIAGNOSIS — K219 Gastro-esophageal reflux disease without esophagitis: Secondary | ICD-10-CM

## 2022-04-10 DIAGNOSIS — N184 Chronic kidney disease, stage 4 (severe): Secondary | ICD-10-CM

## 2022-04-10 DIAGNOSIS — Z91199 Patient's noncompliance with other medical treatment and regimen due to unspecified reason: Secondary | ICD-10-CM

## 2022-04-10 DIAGNOSIS — R7989 Other specified abnormal findings of blood chemistry: Secondary | ICD-10-CM | POA: Insufficient documentation

## 2022-04-10 DIAGNOSIS — F3160 Bipolar disorder, current episode mixed, unspecified: Secondary | ICD-10-CM

## 2022-04-10 LAB — RENAL FUNCTION PANEL
Albumin: 2.9 g/dL — ABNORMAL LOW (ref 3.5–5.0)
Anion gap: 13 (ref 5–15)
BUN: 146 mg/dL — ABNORMAL HIGH (ref 8–23)
CO2: 26 mmol/L (ref 22–32)
Calcium: 8.2 mg/dL — ABNORMAL LOW (ref 8.9–10.3)
Chloride: 112 mmol/L — ABNORMAL HIGH (ref 98–111)
Creatinine, Ser: 5.44 mg/dL — ABNORMAL HIGH (ref 0.61–1.24)
GFR, Estimated: 11 mL/min — ABNORMAL LOW (ref >60)
Glucose, Bld: 157 mg/dL — ABNORMAL HIGH (ref 70–99)
Phosphorus: 7.2 mg/dL — ABNORMAL HIGH (ref 2.5–4.6)
Potassium: 4.7 mmol/L (ref 3.5–5.1)
Sodium: 151 mmol/L — ABNORMAL HIGH (ref 135–145)

## 2022-04-10 LAB — GLUCOSE, CAPILLARY
Glucose-Capillary: 164 mg/dL — ABNORMAL HIGH (ref 70–99)
Glucose-Capillary: 217 mg/dL — ABNORMAL HIGH (ref 70–99)
Glucose-Capillary: 134 mg/dL — ABNORMAL HIGH (ref 70–99)
Glucose-Capillary: 179 mg/dL — ABNORMAL HIGH (ref 70–99)
Glucose-Capillary: 166 mg/dL — ABNORMAL HIGH (ref 70–99)

## 2022-04-10 MED ORDER — METOCLOPRAMIDE HCL 5 MG/5ML PO SOLN
5.0000 mg | Freq: Three times a day (TID) | ORAL | Status: DC
  Filled 2022-04-10: qty 10

## 2022-04-10 MED ORDER — SEVELAMER CARBONATE 800 MG PO TABS: 800.0000 mg | ORAL_TABLET | Freq: Three times a day (TID) | ORAL | Status: DC

## 2022-04-10 MED ORDER — OSMOLITE 1.2 CAL PO LIQD
1000.0000 mL | ORAL | Status: DC
  Administered 2022-04-10: 1000 mL
  Filled 2022-04-10 (×2): qty 1000

## 2022-04-10 MED ORDER — SEVELAMER CARBONATE 800 MG PO TABS
800.0000 mg | ORAL_TABLET | Freq: Three times a day (TID) | ORAL | Status: DC
  Administered 2022-04-11: 800 mg
  Filled 2022-04-10: qty 1

## 2022-04-10 MED ORDER — STERILE WATER FOR INJECTION IJ SOLN
INTRAMUSCULAR | Status: AC
  Administered 2022-04-10: 10 mL
  Filled 2022-04-10: qty 10

## 2022-04-10 MED ORDER — DARBEPOETIN ALFA 60 MCG/0.3ML IJ SOSY
60.0000 ug | PREFILLED_SYRINGE | Freq: Once | INTRAMUSCULAR | Status: AC
  Administered 2022-04-10: 60 ug via SUBCUTANEOUS
  Filled 2022-04-10: qty 0.3

## 2022-04-10 MED ORDER — METOCLOPRAMIDE HCL 5 MG PO TABS
5.0000 mg | ORAL_TABLET | Freq: Three times a day (TID) | ORAL | Status: DC
  Administered 2022-04-10 – 2022-04-11 (×3): 5 mg
  Filled 2022-04-10 (×7): qty 1

## 2022-04-10 NOTE — Progress Notes (Signed)
Patient being verbally and physically aggressive. Elink made aware and patient is now in four point restraints. Awaiting Geodon from pharmacy.

## 2022-04-10 NOTE — Progress Notes (Signed)
PT Cancellation Note  Patient Details Name: Bradley Tran MRN: [id number] DOB: 1950-11-19   Cancelled Treatment:    Reason Eval/Treat Not Completed: Medical issues which prohibited therapy  Patient remains agitated and in 4 point restraints. Will follow.   [place]  Office [phone]   Rexanne Mano 04/10/2022, 11:52 AM

## 2022-04-10 NOTE — Plan of Care (Signed)
  Problem: Education: Goal: Knowledge of risk factors and measures for prevention of condition will improve 04/10/2022 2016 by Guinevere Scarlet, RN Outcome: Not Progressing 04/10/2022 2016 by Guinevere Scarlet, RN Outcome: Progressing   Problem: Coping: Goal: Psychosocial and spiritual needs will be supported 04/10/2022 2016 by Guinevere Scarlet, RN Outcome: Not Progressing 04/10/2022 2016 by Guinevere Scarlet, RN Outcome: Progressing   Problem: Respiratory: Goal: Will maintain a patent airway 04/10/2022 2016 by Guinevere Scarlet, RN Outcome: Not Progressing 04/10/2022 2016 by Guinevere Scarlet, RN Outcome: Progressing  Patient is confused

## 2022-04-10 NOTE — Progress Notes (Signed)
[facility] NEPHROLOGY PROGRESS NOTE  Assessment/ Plan: Pt is a 72 y.o. yo male with a history of bipolar mood disorder, DM, COPD, homelessness, advanced longstanding CKD who was presented with acute respiratory failure with hypoxia seen as a consultation for CKD and electrolytes abnormalities.  # Acute kidney injury on CKD stage IV with azotemia: He has longstanding advanced CKD with creatinine level around 3-4.  AKI presumably hemodynamically mediated with dehydration.  He also has hypernatremia.  High BUN can also be contributed by recent steroid use. Receiving hypotonic fluid and free water with downtrending of creatinine and sodium level.  I am not sure how much he is able to cooperate for fluid intake.  Continue D5W and monitor lab. Given his comorbidities, agitation, he may not be a good candidate for long-term dialysis unless significant change in his condition gradually.  We will continue to follow.  Continue daily lab and strict ins and out.  # Hypernatremia with free water deficit: He looks dry on exam.  Currently on free water from the tube and on D5W.  It would be nice if he can drink water.  Follow lab.  # Anemia: Iron saturation 68%, order a dose of Aranesp. Monitor hemoglobin.  # CKD-MBD-hyperphosphatemia: Check PTH level, start sevelamer when he takes orally.  # Hypotension: Blood pressure better today, on IV fluid.  Not on antihypertensive.    # Bipolar disorder with acute agitation: He was calmer this morning however really not cooperating with exam and not able to get history from him.  Seems like the mental status is gradually improving with his antipsych medications.  Seen by psychiatrist.  Subjective: The patient was seen and examined in ICU.  Started IV fluid yesterday.  The urine output increased to 1.5 L.  He was calm and quite somnolent this morning when I saw him.  No major health event overnight except episodes of agitation. Objective Vital signs in  last 24 hours: Vitals:   04/10/22 0739 04/10/22 0751 04/10/22 0752 04/10/22 0800  BP:    115/72  Pulse:    94  Resp:    (!) 24  Temp: 98.9 F (37.2 C)     TempSrc: Axillary     SpO2:  100% 100% 100%  Weight:       Weight change:   Intake/Output Summary (Last 24 hours) at 04/10/2022 0937 Last data filed at 04/10/2022 0800 Gross per 24 hour  Intake 5953.04 ml  Output 1650 ml  Net 4303.04 ml        Labs: RENAL PANEL Recent Labs  Lab 04/05/22 0705 04/06/22 0301 04/06/22 1116 04/06/22 1631 04/08/22 0826 04/08/22 0833 04/08/22 1836 04/09/22 0932 04/10/22 0219  NA 146* 149*  --   --   --  153*  --  156* 151*  K 3.9 4.7  --   --   --  4.7  --  5.0 4.7  CL 110 111  --   --   --  118*  --  118* 112*  CO2 21* 19*  --   --   --  23  --  25 26  GLUCOSE 115* 105*  --   --   --  111*  --  153* 157*  BUN 114* 132*  --   --   --  142*  --  144* 146*  CREATININE 5.80* 5.88*  --   --   --  5.48*  --  5.60* 5.44*  CALCIUM 8.1* 8.4*  --   --   --  8.7*  --  8.4* 8.2*  MG 1.9 2.6* 2.5* 2.5*  --  2.6* 2.6* 2.4  --   PHOS 5.6* 8.1* 8.5* 8.5*  --  8.2* 8.3* 7.6* 7.2*  ALBUMIN  --  2.9*  --   --  3.1*  --   --   --  2.9*     Liver Function Tests: Recent Labs  Lab 04/06/22 0301 04/08/22 0826 04/10/22 0219  AST 13* 14*  --   ALT 14 15  --   ALKPHOS 58 63  --   BILITOT 0.6 0.7  --   PROT 5.8* 6.1*  --   ALBUMIN 2.9* 3.1* 2.9*    No results for input(s): "LIPASE", "AMYLASE" in the last 168 hours. Recent Labs  Lab 04/08/22 1058  AMMONIA <10    CBC: Recent Labs    04/04/22 0351 04/05/22 0705 04/06/22 0301 04/08/22 0833 04/09/22 0932 04/09/22 1018  HGB 7.5* 7.8* 7.9* 7.4* 7.3*  --   MCV  --  97.3 100.4* 102.9* 102.5*  --   VITAMINB12  --   --   --   --  1,171*  --   FOLATE  --   --   --   --  8.9  --   FERRITIN  --   --   --   --   --  330  TIBC  --   --   --   --   --  153*  IRON  --   --   --   --   --  104     Cardiac Enzymes: No results for input(s):  "CKTOTAL", "CKMB", "CKMBINDEX", "TROPONINI" in the last 168 hours. CBG: Recent Labs  Lab 04/09/22 1534 04/09/22 1926 04/09/22 2322 04/10/22 0354 04/10/22 0735  GLUCAP 120* 196* 115* 164* 217*     Iron Studies:  Recent Labs    04/09/22 1018  IRON 104  TIBC 153*  FERRITIN 330   Studies/Results: No results found.  Medications: Infusions:  dextrose 100 mL/hr at 04/10/22 0700   feeding supplement (OSMOLITE 1.2 CAL) 65 mL/hr at 04/10/22 0700    Scheduled Medications:  arformoterol  15 mcg Nebulization BID   bethanechol  10 mg Per Tube TID   Chlorhexidine Gluconate Cloth  6 each Topical Daily   divalproex  125 mg Oral Q12H   free water  200 mL Per Tube Q2H   heparin injection (subcutaneous)  5,000 Units Subcutaneous Q8H   hydrOXYzine  10 mg Per Tube BID   insulin aspart  0-15 Units Subcutaneous Q4H   levothyroxine  100 mcg Per Tube Q0600   polyethylene glycol  17 g Per Tube BID   QUEtiapine  200 mg Per Tube TID   revefenacin  175 mcg Nebulization Daily   senna  1 tablet Per Tube BID   thiamine  100 mg Per Tube Daily    have reviewed scheduled and prn medications.  Physical Exam: General: Elderly looking male, somnolent. Heart:RRR, s1s2 nl Lungs:clear b/l, no crackle Abdomen:soft, Non-tender, non-distended Extremities:No edema Neurology: Confused male  [place] 04/10/2022,9:37 AM  LOS: 7 days

## 2022-04-10 NOTE — Progress Notes (Signed)
PROGRESS NOTE  Bradley Tran [id number] DOB: 08/26/1950   PCP: [facility]  Patient is from: Homeless  DOA: 03/23/2022 LOS: 7  Chief complaints Chief Complaint  Patient presents with   Shortness of Breath     Brief Narrative / Interim history: 72 year old M with PMH of COPD, bipolar disorder, DM-2, hypothyroidism, PE in 12/2020 no longer on [facility], homelessness and noncompliance presenting with shortness of breath and admitted to ICU for acute respiratory failure with hypoxia in the setting of COPD exacerbation and COVID-19 infection, and severe agitation.  Reportedly tachypneic with saturation in 60s and 70s on room air.  He remained in respiratory distress despite  steroid, magnesium and nebulizers and  en route to ED. Cr 6.57 (baseline 4).  BUN 79.  BNP 158.  VBG 7.15/58/145/20.5.  Ethanol and UDS negative.  CXR without acute finding.  Patient was initially placed on BiPAP, then intubated on 2/24.  Patient was extubated on 2/26.  Came off Precedex on 2/29.  Psych consulted for ongoing agitation and started medications.  Patient was transferred to [facility] hospitalist service on 04/10/2022.  Subjective: Seen and examined earlier this morning.  Patient was agitated and required 4 point restraints overnight.  He remains agitated this morning.  He complains about the restraint.  Saturating at 100% on 4 L.  Objective: Vitals:   04/10/22 0751 04/10/22 0752 04/10/22 0800 04/10/22 1116  BP:   115/72   Pulse:   94   Resp:   (!) 24   Temp:    98.9 F (37.2 C)  TempSrc:    Axillary  SpO2: 100% 100% 100%   Weight:        Examination:  GENERAL: No apparent distress.  Nontoxic. HEENT: MMM.  Vision and hearing grossly intact.  NECK: Supple.  No apparent JVD.  RESP:  No IWOB.  Fair aeration bilaterally. CVS:  RRR. Heart sounds normal.  ABD/GI/GU: BS+. Abd soft, NTND.  MSK/EXT:  Moves extremities.  Significant muscle mass and subcu fat loss.  Restraints SKIN: no apparent skin  lesion or wound NEURO: Awake, alert and oriented appropriately.  No apparent focal neuro deficit. PSYCH: Somewhat upset and agitated.  Procedures:  2/24-2/26-intubation and mechanical ventilation.  Microbiology summarized: 2/23-COVID-19 PCR positive 2/24-MRSA PCR screen negative 2/24-blood cultures NGTD  Assessment and plan: Principal Problem:   Acute respiratory failure with hypoxia and hypercapnia ([name]) Active Problems:   AKI (acute kidney injury) ([place])   DM (diabetes mellitus), type 2 with renal complications ([name])   COPD exacerbation ([name])   CKD (chronic kidney disease) stage 4, GFR 15-29 ml/min ([name])   Bipolar disorder, unspecified ([name])   Gastroesophageal reflux disease   Hypothyroidism   COVID-19   Protein-calorie malnutrition, severe   Agitation   Anemia of chronic disease   Noncompliance   Constipation   Azotemia  Acute respiratory failure with hypoxia/hypercarbia COPD exacerbation COVID positive -Intubation and mechanical ventilation from 2/24-2/26. -Wean oxygen as able -Continue bronchodilators and pulmonary toilet.    Bipolar Disorder with Acute Agitation: Required restraints overnight. He is awake and alert but remains agitated.  Came off Precedex on 2/29.  BUN elevated to 146. -Psychiatry following-started Depakote, continue Seroquel and Atarax. -Check Depakote level on 3/3. -Monitor QTc -Optimize electrolytes  AKI on CKD-4/azotemia/hypernatremia: Could be contributing to his mental status change although he is agitated not somnolent.  Hypernatremia improved. Recent Labs    03/19/2022 2151 04/03/22 0142 04/03/22 [id number] 04/03/22 2003 04/04/22 0101 04/04/22 1636 04/05/22 [id number] 04/06/22 0301 04/08/22 [id number]  04/09/22 0932 04/10/22 0219  BUN 90*  --  83* 85* 86* 96* 114* 132* 142* 144* 146*  CREATININE 7.20* 6.56* 6.47* 6.10* 6.04* 5.89* 5.80* 5.88* 5.48* 5.60* 5.44*  -Nephrology following.  Concern about candidacy for long-term HD given his  comorbidities and agitation. -Continue D5 water and monitor labs per nephrology -Avoid nephrotoxic meds.  Controlled IDDM-2 with hyperglycemia and CKD-4: A1c 6.5%. Recent Labs  Lab 04/09/22 1926 04/09/22 2322 04/10/22 0354 04/10/22 0735 04/10/22 1114  GLUCAP 196* 115* 164* 217* 134*  -Continue current insulin regimen.   Hx PE 12/2020: No longer on anticoagulation.  Lower extremity venous Doppler negative -Subcu heparin for prophylaxis  Acute urinary retention: -Coude foley placed 2/24-continue while using restraints.   Essential hypertension: Normotensive.   Anemia of chronic disease/thrombocytopenia: Thrombocytopenia is chronic.  H&H relatively stable. Recent Labs    04/03/22 0538 04/03/22 1208 04/03/22 1433 04/03/22 2134 04/04/22 0101 04/04/22 0351 04/05/22 0705 04/06/22 0301 04/08/22 0833 04/09/22 0932  HGB 9.1* 8.5* 8.2* 7.5* 7.1* 7.5* 7.8* 7.9* 7.4* 7.3*  -Continue monitoring  Hypothyroidism -Continue PO Synthroid    Constipation: LBM? -KUB -Scheduled MiraLAX and senna twice daily until he has bowel movement   Medication noncompliance: Due to underlying psych issue? -Will counsel once able to comprehend  Severe malnutrition Body mass index is 20.4 kg/m. Nutrition Problem: Severe Malnutrition Etiology: chronic illness (COPD, also suspect component of social/environmental circumstances impacting nutrition status in setting of homelessness prior to admission) Signs/Symptoms: severe fat depletion, severe muscle depletion Interventions: Refer to RD note for recommendations   DVT prophylaxis:  heparin injection 5,000 Units Start: 04/07/22 1400  Code Status: Full code Family Communication: No family member listed Level of care: Telemetry Medical Status is: Inpatient Remains inpatient appropriate because: Respiratory failure, AKI and agitation   Final disposition:  Consultants:  Pulmonology admitted patient Nephrology Psychiatry  55 minutes with  more than 50% spent in reviewing records, counseling patient/family and coordinating care.   Sch Meds:  Scheduled Meds:  arformoterol  15 mcg Nebulization BID   Chlorhexidine Gluconate Cloth  6 each Topical Daily   darbepoetin (ARANESP) injection - NON-DIALYSIS  60 mcg Subcutaneous Once   divalproex  125 mg Oral Q12H   free water  200 mL Per Tube Q2H   heparin injection (subcutaneous)  5,000 Units Subcutaneous Q8H   hydrOXYzine  10 mg Per Tube BID   insulin aspart  0-15 Units Subcutaneous Q4H   levothyroxine  100 mcg Per Tube Q0600   polyethylene glycol  17 g Per Tube BID   QUEtiapine  200 mg Per Tube TID   revefenacin  175 mcg Nebulization Daily   senna  1 tablet Per Tube BID   sevelamer carbonate  800 mg Oral TID WC   thiamine  100 mg Per Tube Daily   Continuous Infusions:  dextrose 100 mL/hr at 04/10/22 0700   feeding supplement (OSMOLITE 1.2 CAL) 65 mL/hr at 04/10/22 0700   PRN Meds:.ipratropium-albuterol, mouth rinse, mouth rinse, ziprasidone  Antimicrobials: Anti-infectives (From admission, onward)    Start     Dose/Rate Route Frequency Ordered Stop   04/08/22 0831  doxycycline (VIBRA-TABS) tablet 100 mg        100 mg Per Tube Every 12 hours 04/08/22 0831 04/09/22 2038   04/06/22 1200  doxycycline (VIBRA-TABS) tablet 100 mg  Status:  Discontinued        100 mg Per Tube Every 12 hours 04/06/22 1017 04/06/22 1058   04/06/22 1200  doxycycline (VIBRAMYCIN) 100  mg in sodium chloride 0.9 % 250 mL IVPB  Status:  Discontinued        100 mg 125 mL/hr over 120 Minutes Intravenous Every 12 hours 04/06/22 1058 04/08/22 0831   04/06/22 1000  doxycycline (VIBRA-TABS) tablet 100 mg  Status:  Discontinued        100 mg Per Tube Every 12 hours 04/05/22 1352 04/06/22 0747   04/06/22 1000  doxycycline (VIBRA-TABS) tablet 100 mg  Status:  Discontinued        100 mg Oral Every 12 hours 04/06/22 0747 04/06/22 1017   04/05/22 1445  doxycycline (VIBRAMYCIN) 100 mg in sodium chloride 0.9 % 250  mL IVPB        100 mg 125 mL/hr over 120 Minutes Intravenous  Once 04/05/22 1352 04/05/22 1627   04/05/22 1400  doxycycline (VIBRA-TABS) tablet 100 mg  Status:  Discontinued        100 mg Per Tube Every 12 hours 04/05/22 1023 04/05/22 1352   04/04/22 1000  remdesivir 100 mg in sodium chloride 0.9 % 100 mL IVPB  Status:  Discontinued       See Hyperspace for full Linked Orders Report.   100 mg 200 mL/hr over 30 Minutes Intravenous Daily 04/03/22 0132 04/05/22 1154   04/03/22 1300  doxycycline (VIBRAMYCIN) 100 mg in sodium chloride 0.9 % 250 mL IVPB  Status:  Discontinued        100 mg 125 mL/hr over 120 Minutes Intravenous Every 12 hours 04/03/22 1207 04/05/22 1023   04/03/22 1245  doxycycline (VIBRA-TABS) tablet 100 mg  Status:  Discontinued        100 mg Oral Every 12 hours 04/03/22 1159 04/03/22 1206   04/03/22 0230  remdesivir 200 mg in sodium chloride 0.9% 250 mL IVPB       See Hyperspace for full Linked Orders Report.   200 mg 580 mL/hr over 30 Minutes Intravenous Once 04/03/22 0132 04/03/22 0437        I have personally reviewed the following labs and images: CBC: Recent Labs  Lab 04/04/22 0101 04/04/22 0351 04/05/22 0705 04/06/22 0301 04/08/22 0833 04/09/22 0932  WBC 6.5  --  7.3 8.8 10.7* 8.7  HGB 7.1* 7.5* 7.8* 7.9* 7.4* 7.3*  HCT 22.4* 22.0* 25.0* 25.3* 24.9* 24.3*  MCV 97.0  --  97.3 100.4* 102.9* 102.5*  PLT 157  --  149* 138* 126* 101*   BMP &GFR Recent Labs  Lab 04/05/22 0705 04/06/22 0301 04/06/22 1116 04/06/22 1631 04/08/22 0833 04/08/22 1836 04/09/22 0932 04/10/22 0219  NA 146* 149*  --   --  153*  --  156* 151*  K 3.9 4.7  --   --  4.7  --  5.0 4.7  CL 110 111  --   --  118*  --  118* 112*  CO2 21* 19*  --   --  23  --  25 26  GLUCOSE 115* 105*  --   --  111*  --  153* 157*  BUN 114* 132*  --   --  142*  --  144* 146*  CREATININE 5.80* 5.88*  --   --  5.48*  --  5.60* 5.44*  CALCIUM 8.1* 8.4*  --   --  8.7*  --  8.4* 8.2*  MG 1.9 2.6* 2.5*  2.5* 2.6* 2.6* 2.4  --   PHOS 5.6* 8.1* 8.5* 8.5* 8.2* 8.3* 7.6* 7.2*   Estimated Creatinine Clearance: 9.8 mL/min (A) (by C-G formula based  on SCr of 5.44 mg/dL (H)). Liver & Pancreas: Recent Labs  Lab 04/06/22 0301 04/08/22 0826 04/10/22 0219  AST 13* 14*  --   ALT 14 15  --   ALKPHOS 58 63  --   BILITOT 0.6 0.7  --   PROT 5.8* 6.1*  --   ALBUMIN 2.9* 3.1* 2.9*   No results for input(s): "LIPASE", "AMYLASE" in the last 168 hours. Recent Labs  Lab 04/08/22 1058  AMMONIA <10   Diabetic: No results for input(s): "HGBA1C" in the last 72 hours. Recent Labs  Lab 04/09/22 1926 04/09/22 2322 04/10/22 0354 04/10/22 0735 04/10/22 1114  GLUCAP 196* 115* 164* 217* 134*   Cardiac Enzymes: No results for input(s): "CKTOTAL", "CKMB", "CKMBINDEX", "TROPONINI" in the last 168 hours. No results for input(s): "PROBNP" in the last 8760 hours. Coagulation Profile: No results for input(s): "INR", "PROTIME" in the last 168 hours. Thyroid Function Tests: No results for input(s): "TSH", "T4TOTAL", "FREET4", "T3FREE", "THYROIDAB" in the last 72 hours. Lipid Profile: No results for input(s): "CHOL", "HDL", "LDLCALC", "TRIG", "CHOLHDL", "LDLDIRECT" in the last 72 hours. Anemia Panel: Recent Labs    04/09/22 0932 04/09/22 1018  VITAMINB12 1,171*  --   FOLATE 8.9  --   FERRITIN  --  330  TIBC  --  153*  IRON  --  104   Urine analysis:    Component Value Date/Time   COLORURINE STRAW (A) 04/04/2022 0409   APPEARANCEUR CLEAR 04/04/2022 0409   LABSPEC 1.010 04/04/2022 0409   PHURINE 5.0 04/04/2022 0409   GLUCOSEU NEGATIVE 04/04/2022 0409   HGBUR NEGATIVE 04/04/2022 0409   BILIRUBINUR NEGATIVE 04/04/2022 0409   KETONESUR NEGATIVE 04/04/2022 0409   PROTEINUR 30 (A) 04/04/2022 0409   UROBILINOGEN 0.2 01/13/2010 0944   NITRITE NEGATIVE 04/04/2022 0409   LEUKOCYTESUR TRACE (A) 04/04/2022 0409   Sepsis Labs: Invalid input(s): "PROCALCITONIN", "LACTICIDVEN"  Microbiology: Recent  Results (from the past 240 hour(s))  Resp panel by RT-PCR (RSV, Flu A&B, Covid) Anterior Nasal Swab     Status: Abnormal   Collection Time: 03/24/2022  9:36 PM   Specimen: Anterior Nasal Swab  Result Value Ref Range Status   SARS Coronavirus 2 by RT PCR POSITIVE (A) NEGATIVE Final   Influenza A by PCR NEGATIVE NEGATIVE Final   Influenza B by PCR NEGATIVE NEGATIVE Final    Comment: (NOTE) The Xpert Xpress SARS-CoV-2/FLU/RSV plus assay is intended as an aid in the diagnosis of influenza from Nasopharyngeal swab specimens and should not be used as a sole basis for treatment. Nasal washings and aspirates are unacceptable for Xpert Xpress SARS-CoV-2/FLU/RSV testing.  Fact Sheet for Patients: EntrepreneurPulse.com.au  Fact Sheet for Healthcare Providers: IncredibleEmployment.be  This test is not yet approved or cleared by the [place] FDA and has been authorized for detection and/or diagnosis of SARS-CoV-2 by FDA under an Emergency Use Authorization (EUA). This EUA will remain in effect (meaning this test can be used) for the duration of the COVID-19 declaration under Section 564(b)(1) of the Act, 21 U.S.C. section 360bbb-3(b)(1), unless the authorization is terminated or revoked.     Resp Syncytial Virus by PCR NEGATIVE NEGATIVE Final    Comment: (NOTE) Fact Sheet for Patients: EntrepreneurPulse.com.au  Fact Sheet for Healthcare Providers: IncredibleEmployment.be  This test is not yet approved or cleared by the [place] FDA and has been authorized for detection and/or diagnosis of SARS-CoV-2 by FDA under an Emergency Use Authorization (EUA). This EUA will remain in effect (meaning this test can  be used) for the duration of the COVID-19 declaration under Section 564(b)(1) of the Act, 21 U.S.C. section 360bbb-3(b)(1), unless the authorization is terminated or revoked.  Performed at [facility], [place] [address], [place], [place] [address]   Culture, blood (routine x 2)     Status: None   Collection Time: 04/03/22  1:36 AM   Specimen: BLOOD  Result Value Ref Range Status   Specimen Description BLOOD LEFT ANTECUBITAL  Final   Special Requests   Final    BOTTLES DRAWN AEROBIC ONLY Blood Culture results may not be optimal due to an inadequate volume of blood received in culture bottles   Culture   Final    NO GROWTH 5 DAYS Performed at [facility], [place] [address], [place], [place] [address]    Report Status 04/08/2022 FINAL  Final  Culture, blood (routine x 2)     Status: None   Collection Time: 04/03/22  1:42 AM   Specimen: BLOOD LEFT FOREARM  Result Value Ref Range Status   Specimen Description BLOOD LEFT FOREARM  Final   Special Requests   Final    BOTTLES DRAWN AEROBIC AND ANAEROBIC Blood Culture adequate volume   Culture   Final    NO GROWTH 5 DAYS Performed at [facility], [place] [address], [place], [place] [address]    Report Status 04/08/2022 FINAL  Final  MRSA Next Gen by PCR, Nasal     Status: None   Collection Time: 04/03/22  5:43 AM   Specimen: Nasal Mucosa; Nasal Swab  Result Value Ref Range Status   MRSA by PCR Next Gen NOT DETECTED NOT DETECTED Final    Comment: (NOTE) The GeneXpert MRSA Assay (FDA approved for NASAL specimens only), is one component of a comprehensive MRSA colonization surveillance program. It is not intended to diagnose MRSA infection nor to guide or monitor treatment for MRSA infections. Test performance is not FDA approved in patients less than 63 years old. Performed at [facility], [place] [address], [place], [place] [address]     Radiology Studies: No results found.    Nizhoni Parlow T. [place]  If 7PM-7AM, please contact night-coverage [email or web address] 04/10/2022, 11:48 AM

## 2022-04-11 ENCOUNTER — Inpatient Hospital Stay (HOSPITAL_COMMUNITY): Payer: Medicare Other

## 2022-04-11 DIAGNOSIS — J9602 Acute respiratory failure with hypercapnia: Secondary | ICD-10-CM | POA: Diagnosis not present

## 2022-04-11 DIAGNOSIS — I469 Cardiac arrest, cause unspecified: Secondary | ICD-10-CM

## 2022-04-11 DIAGNOSIS — J9601 Acute respiratory failure with hypoxia: Secondary | ICD-10-CM | POA: Diagnosis not present

## 2022-04-11 DIAGNOSIS — N179 Acute kidney failure, unspecified: Secondary | ICD-10-CM | POA: Diagnosis not present

## 2022-04-11 DIAGNOSIS — J441 Chronic obstructive pulmonary disease with (acute) exacerbation: Secondary | ICD-10-CM | POA: Diagnosis not present

## 2022-04-11 DIAGNOSIS — R451 Restlessness and agitation: Secondary | ICD-10-CM | POA: Diagnosis not present

## 2022-04-11 LAB — PREPARE RBC (CROSSMATCH)

## 2022-04-11 LAB — RENAL FUNCTION PANEL
Albumin: 2.5 g/dL — ABNORMAL LOW (ref 3.5–5.0)
Anion gap: 14 (ref 5–15)
BUN: 143 mg/dL — ABNORMAL HIGH (ref 8–23)
CO2: 27 mmol/L (ref 22–32)
Calcium: 8.2 mg/dL — ABNORMAL LOW (ref 8.9–10.3)
Chloride: 101 mmol/L (ref 98–111)
Creatinine, Ser: 4.81 mg/dL — ABNORMAL HIGH (ref 0.61–1.24)
GFR, Estimated: 12 mL/min — ABNORMAL LOW (ref >60)
Glucose, Bld: 158 mg/dL — ABNORMAL HIGH (ref 70–99)
Phosphorus: 6.8 mg/dL — ABNORMAL HIGH (ref 2.5–4.6)
Potassium: 5.2 mmol/L — ABNORMAL HIGH (ref 3.5–5.1)
Sodium: 142 mmol/L (ref 135–145)

## 2022-04-11 LAB — COMPREHENSIVE METABOLIC PANEL
ALT: 29 U/L (ref 0–44)
AST: 40 U/L (ref 15–41)
Albumin: 2.1 g/dL — ABNORMAL LOW (ref 3.5–5.0)
Alkaline Phosphatase: 47 U/L (ref 38–126)
Anion gap: 13 (ref 5–15)
BUN: 149 mg/dL — ABNORMAL HIGH (ref 8–23)
CO2: 25 mmol/L (ref 22–32)
Calcium: 9.5 mg/dL (ref 8.9–10.3)
Chloride: 101 mmol/L (ref 98–111)
Creatinine, Ser: 4.86 mg/dL — ABNORMAL HIGH (ref 0.61–1.24)
GFR, Estimated: 12 mL/min — ABNORMAL LOW (ref >60)
Glucose, Bld: 208 mg/dL — ABNORMAL HIGH (ref 70–99)
Potassium: 6.1 mmol/L — ABNORMAL HIGH (ref 3.5–5.1)
Sodium: 139 mmol/L (ref 135–145)
Total Bilirubin: 0.5 mg/dL (ref 0.3–1.2)
Total Protein: 4.5 g/dL — ABNORMAL LOW (ref 6.5–8.1)

## 2022-04-11 LAB — CBC
HCT: 20.4 % — ABNORMAL LOW (ref 39.0–52.0)
HCT: 21.2 % — ABNORMAL LOW (ref 39.0–52.0)
Hemoglobin: 6.5 g/dL — CL (ref 13.0–17.0)
Hemoglobin: 6.5 g/dL — CL (ref 13.0–17.0)
MCH: 31.6 pg (ref 26.0–34.0)
MCH: 31.3 pg (ref 26.0–34.0)
MCHC: 31.9 g/dL (ref 30.0–36.0)
MCHC: 30.7 g/dL (ref 30.0–36.0)
MCV: 99 fL (ref 80.0–100.0)
MCV: 101.9 fL — ABNORMAL HIGH (ref 80.0–100.0)
Platelets: 64 10*3/uL — ABNORMAL LOW (ref 150–400)
Platelets: 50 10*3/uL — ABNORMAL LOW (ref 150–400)
RBC: 2.06 MIL/uL — ABNORMAL LOW (ref 4.22–5.81)
RBC: 2.08 MIL/uL — ABNORMAL LOW (ref 4.22–5.81)
RDW: 13.8 % (ref 11.5–15.5)
RDW: 14.5 % (ref 11.5–15.5)
WBC: 13.4 10*3/uL — ABNORMAL HIGH (ref 4.0–10.5)
WBC: 10.8 10*3/uL — ABNORMAL HIGH (ref 4.0–10.5)
nRBC: 0 % (ref 0.0–0.2)
nRBC: 0.3 % — ABNORMAL HIGH (ref 0.0–0.2)

## 2022-04-11 LAB — MAGNESIUM
Magnesium: 2.2 mg/dL (ref 1.7–2.4)
Magnesium: 2.4 mg/dL (ref 1.7–2.4)

## 2022-04-11 LAB — VITAMIN B12: Vitamin B-12: 906 pg/mL (ref 180–914)

## 2022-04-11 LAB — GLUCOSE, CAPILLARY
Glucose-Capillary: 138 mg/dL — ABNORMAL HIGH (ref 70–99)
Glucose-Capillary: 152 mg/dL — ABNORMAL HIGH (ref 70–99)
Glucose-Capillary: 157 mg/dL — ABNORMAL HIGH (ref 70–99)
Glucose-Capillary: 159 mg/dL — ABNORMAL HIGH (ref 70–99)
Glucose-Capillary: 239 mg/dL — ABNORMAL HIGH (ref 70–99)

## 2022-04-11 LAB — TSH: TSH: 28.942 u[IU]/mL — ABNORMAL HIGH (ref 0.350–4.500)

## 2022-04-11 LAB — VALPROIC ACID LEVEL: Valproic Acid Lvl: 15 ug/mL — ABNORMAL LOW (ref 50.0–100.0)

## 2022-04-11 LAB — IRON AND TIBC
Iron: 81 ug/dL (ref 45–182)
Saturation Ratios: 58 % — ABNORMAL HIGH (ref 17.9–39.5)
TIBC: 139 ug/dL — ABNORMAL LOW (ref 250–450)
UIBC: 58 ug/dL

## 2022-04-11 LAB — LACTIC ACID, PLASMA: Lactic Acid, Venous: 5 mmol/L (ref 0.5–1.9)

## 2022-04-11 LAB — RETICULOCYTES
Immature Retic Fract: 4.3 % (ref 2.3–15.9)
RBC.: 2.06 MIL/uL — ABNORMAL LOW (ref 4.22–5.81)
Retic Count, Absolute: 15.5 10*3/uL — ABNORMAL LOW (ref 19.0–186.0)
Retic Ct Pct: 0.8 % (ref 0.4–3.1)

## 2022-04-11 LAB — FERRITIN: Ferritin: 348 ng/mL — ABNORMAL HIGH (ref 24–336)

## 2022-04-11 LAB — FOLATE: Folate: 5.7 ng/mL — ABNORMAL LOW (ref >5.9)

## 2022-04-11 LAB — TROPONIN I (HIGH SENSITIVITY): Troponin I (High Sensitivity): 18 ng/L — ABNORMAL HIGH (ref <18)

## 2022-04-11 LAB — ABO/RH: ABO/RH(D): A POS

## 2022-04-11 MED ORDER — POLYETHYLENE GLYCOL 3350 17 G PO PACK: 17.0000 g | PACK | Freq: Every day | ORAL | Status: DC

## 2022-04-11 MED ORDER — SODIUM ZIRCONIUM CYCLOSILICATE 10 G PO PACK: 10.0000 g | PACK | Freq: Once | ORAL | Status: DC

## 2022-04-11 MED ORDER — DEXTROSE 50 % IV SOLN: 1.0000 | Freq: Once | INTRAVENOUS | Status: DC

## 2022-04-11 MED ORDER — CALCIUM GLUCONATE-NACL 1-0.675 GM/50ML-% IV SOLN: 1.0000 g | Freq: Once | INTRAVENOUS | Status: DC

## 2022-04-11 MED ORDER — INSULIN ASPART 100 UNIT/ML IV SOLN: 10.0000 [IU] | Freq: Once | INTRAVENOUS | Status: DC

## 2022-04-11 MED ORDER — FENTANYL CITRATE PF 50 MCG/ML IJ SOSY
PREFILLED_SYRINGE | INTRAMUSCULAR | Status: AC
  Filled 2022-04-11: qty 1

## 2022-04-11 MED ORDER — DOCUSATE SODIUM 50 MG/5ML PO LIQD: 100.0000 mg | Freq: Two times a day (BID) | ORAL | Status: DC

## 2022-04-11 MED ORDER — SODIUM CHLORIDE 0.9% IV SOLUTION: Freq: Once | INTRAVENOUS | Status: DC

## 2022-04-11 MED ORDER — FENTANYL CITRATE PF 50 MCG/ML IJ SOSY
50.0000 ug | PREFILLED_SYRINGE | INTRAMUSCULAR | Status: DC | PRN
  Administered 2022-04-11: 50 ug via INTRAVENOUS

## 2022-04-11 MED ORDER — PANTOPRAZOLE INFUSION (NEW) - SIMPLE MED
8.0000 mg/h | INTRAVENOUS | Status: DC
  Filled 2022-04-11: qty 100

## 2022-04-11 MED ORDER — PROPOFOL 1000 MG/100ML IV EMUL
INTRAVENOUS | Status: AC
  Filled 2022-04-11: qty 100

## 2022-04-11 MED ORDER — DEXMEDETOMIDINE HCL IN NACL 400 MCG/100ML IV SOLN: 0.0000 ug/kg/h | INTRAVENOUS | Status: DC

## 2022-04-11 MED ORDER — PANTOPRAZOLE SODIUM 40 MG IV SOLR: 40.0000 mg | Freq: Two times a day (BID) | INTRAVENOUS | Status: DC

## 2022-04-11 MED ORDER — FENTANYL CITRATE PF 50 MCG/ML IJ SOSY: 50.0000 ug | PREFILLED_SYRINGE | INTRAMUSCULAR | Status: DC | PRN

## 2022-04-11 MED ORDER — PANTOPRAZOLE 80MG IVPB - SIMPLE MED
80.0000 mg | Freq: Once | INTRAVENOUS | Status: DC
  Filled 2022-04-11: qty 100

## 2022-04-12 LAB — PREPARE RBC (CROSSMATCH)

## 2022-04-12 LAB — VITAMIN B1: Vitamin B1 (Thiamine): 144 nmol/L (ref 66.5–200.0)

## 2022-04-13 LAB — TYPE AND SCREEN
ABO/RH(D): A POS
Antibody Screen: NEGATIVE
Unit division: 0
Unit division: 0

## 2022-04-13 LAB — BPAM RBC
Blood Product Expiration Date: 202403272359
Blood Product Expiration Date: 202403272359
ISSUE DATE / TIME: 202403031445
Unit Type and Rh: 6200
Unit Type and Rh: 6200

## 2022-04-13 LAB — PARATHYROID HORMONE, INTACT (NO CA): PTH: 99 pg/mL — ABNORMAL HIGH (ref 15–65)

## 2022-05-04 MED FILL — Medication: Qty: 1 | Status: AC

## 2022-05-10 NOTE — Progress Notes (Signed)
Patient death was pronounced at 47 by June Leap, DO at with pulseless V Tach. At 1707 patient asystole on telemetry. This RN and Stephanie Acre assessed patient with no pulse, heart sounds or breath sounds. Patient has no next of kin and thus belongings will be place in the body bag with the patient and transported to the morgue with the patient.

## 2022-05-10 NOTE — Code Documentation (Signed)
Code Blue paged at [employer]. Arrived at 1524, zoll on pt and 1 of epi already given prior to my arrival. ROSC achieved at 1540. See code sheet. Pt transferred to 25M.   1640: Code Blue called. ROSC achieved at 1644. See Code sheet. Decision made to make pt comfort care by Md.

## 2022-05-10 NOTE — Progress Notes (Addendum)
NAME:  Bradley Tran, MRN:  [id number], DOB:  [birth year], LOS: 8 ADMISSION DATE:  04/05/2022, CONSULTATION DATE:  2/23 REFERRING MD:  Dr. Francia Greaves, CHIEF COMPLAINT: Acute respiratory failure with hypoxia  History of Present Illness:  Patient is a 72 year old male with pertinent PMH COPD, bipolar, DMT2, hypothyroidism, PE in nov 2022 no longer on ac presents to [facility] ED on 2/23 with acute respiratory failure with hypoxia.  Patient is homeless.  On 2/23 patient came to [facility] long ED for difficulty breathing.  Patient was belligerent and cussing at staff.  Patient was unwilling to have workup done and was sent home.  Later today patient was witnessed by bystanders having some increased SOB.  EMS called.  Patient tachypneic with sats 60-70% on room air.  Distant breath sounds.  Given breathing treatments, mag, and IV steroids.   Transferred to [facility] ED.  Upon arrival to [facility] ED patient still in respiratory distress.  Patient somnolent but arousable.  States he feels slightly better with breathing treatments.  States that a week ago he was diagnosed with COVID.  He has productive cough with bodyaches.  Vital stable.  Tmax 99.3 F.  VBG showing 7.15, 58, 145, 20.5.  Patient placed on BiPAP.  COVID-positive.  Cultures obtained.  CXR with no significant findings. Patient had episode of agitation ripping off bipap and pulling out iv's. Given ativan. PCCM consulted for icu admission.  Pertinent ED labs: WBC 7.1, Hgb 9, ethanol WNL, UDS and UA pending, creat 6.57 (baseline 4), BUN 79, GFR 9, troponin 9, bnp 158.  Pertinent  Medical History   Past Medical History:  Diagnosis Date   Acute renal failure ([place])    Bipolar disorder, unspecified ([place])    Diabetes mellitus    Hyperlipidemia    Hypothyroidism     Significant Hospital Events: Including procedures, antibiotic start and stop dates in addition to other pertinent events   2/23 admitted to [facility] resp failure on bipap 2/24 Intubated 2/26 No acute events  overnight, remains on vent 40 FIO2, low min ventilation on SBT trial this am  2/27 extubated successfully yesterday, agitated overnight requiring precedex and haldol 2/28 still on precedex this morning, passed swallow 2/29 unable to wean off precedex 3/1 off precedex gtt, verbally and physically aggressive, psych consult  3/3 cardiac arrest, aspirated coded on floor   Interim History / Subjective:   Transferred back to ICU cardiac arrest.  Objective   Blood pressure 113/68, pulse (!) 170, temperature 98.2 F (36.8 C), temperature source Oral, resp. rate 14, weight 53.9 kg, SpO2 97 %.        Intake/Output Summary (Last 24 hours) at 04/21/2022 1558 Last data filed at 04-21-22 1100 Gross per 24 hour  Intake --  Output 3600 ml  Net -3600 ml   Filed Weights   04/08/22 0353 04/09/22 0809 04/10/22 0358  Weight: 53.8 kg 53.7 kg 53.9 kg    General: Chronically ill-appearing gentleman intubated on mechanical life support HEENT: NCAT, endotracheal tube in place Neuro: Sedated postarrest and intubation CV: Tachycardic, regular, S1-S2 PULM: Rhonchi bilaterally GI: Bowel sounds present nondistended Extremities: No significant edema GU: Deferred  Resolved Hospital Problem list   Prolonged qtc  Assessment & Plan:   PEA cardiac arrest Dark vomitus, possible upper GI bleed Plan: Patient with witnessed vomiting episode aspiration and PEA cardiac arrest on 4. 5 rounds of epi with CPR and ROSC was obtained. Supportive postarrest care Additional IV fluids Stat recheck CBC Continue blood product administration Start PPI.  NG tube to suction  Acute respiratory failure with hypoxia/hypercarbia COPD exacerbation COVID positive -extubated 2/26, still requiring [place] 2 Liters  P: Now intubated on mechanical support No mechanical vent protocol Wean PEEP and FiO2 as tolerated Continue triple therapy nebs Has already completed 7-day course of Doxy. PAD guidelines sedation with fentanyl  and Precedex.  Bipolar Disorder with Acute Agitation -Ammonia <10 On seroquel '200mg'$  TID at baseline  P: Stat ECG, check QTc Continue current medications including Geodon and Seroquel  Type 2 diabetes  -Hgb1c 6.5 P: SSI with CBGs goal 140-180  Hx PE 12/2020  -Self discontinued anticoagulation 03/2021.  Risk for VTE in setting of COVID -Stopped heparin gtt 2/29- unable to obtain CTA 2/2 renal function no dvt, echo reassuring no R heart strain, heparin stopped yesterday P: Holding heparin Until GI bleeding resolved  AKI on CKD 4  Hyperkalemia  P: Monitor urine output, avoid nephrotoxic agents Recent hypotension during last years likely going to worsen his kidney function Appreciate nephrology input [place], calcium, insulin d50  Urinary retention -Coude foley placed 2/24 P: Place Foley  Hx of HTN/HLD  -Remains borderline hypotensive to normotensive  P: Continue to follow VS and Continuous telemetry  Hold statin for now   Anemia, possible upper GI bleed? Thrombocytopenia  P: Trend H&H, Additional PRBCs given today  Hypothyroidism P: Continue PO Synthroid   Constipation  P: Increase bowel regiment    Medication noncompliance  -Refusing medications, interventions in setting of underlying psych hx and acute illness  P: Will need continued ongoing outpatient support.  HPOA Consult to TOC. We have no HPOA or family to help with decisions   Best Practice (right click and "Reselect all SmartList Selections" daily)   Diet/type: clears, passed swallow,  DVT prophylaxis: prophylactic heparin  GI prophylaxis: N/A Lines: N/A Foley:  N/A Code Status:  full code Last date of multidisciplinary goals of care discussion [no contact on file]   This patient is critically ill with multiple organ system failure; which, requires frequent high complexity decision making, assessment, support, evaluation, and titration of therapies. This was completed through the  application of advanced monitoring technologies and extensive interpretation of multiple databases. During this encounter critical care time was devoted to patient care services described in this note for 75 minutes.  Garner Nash, DO [facility] 05-09-2022 3:58 PM

## 2022-05-10 NOTE — Consult Note (Signed)
[facility] Consult   Reason for Consult:  Medication management Bipolar Disorder Referring Physician:  Dr. Cyndia Skeeters Patient Identification: Bradley Tran MRN:  [id number] Principal Diagnosis: Acute respiratory failure with hypoxia and hypercapnia ([place]) Diagnosis:  Principal Problem:   Acute respiratory failure with hypoxia and hypercapnia ([place]) Active Problems:   AKI (acute kidney injury) ([place])   DM (diabetes mellitus), type 2 with renal complications ([name])   COPD exacerbation ([name])   CKD (chronic kidney disease) stage 4, GFR 15-29 ml/min ([name])   Bipolar disorder, unspecified ([name])   Gastroesophageal reflux disease   Hypothyroidism   COVID-19   Protein-calorie malnutrition, severe   Agitation   Anemia of chronic disease   Noncompliance   Constipation   Azotemia   Total Time spent with patient: 30 minutes  Subjective:   Bradley Tran is a 72 y.o. male patient admitted with  Chief Complaint  Patient presents with   Shortness of Breath   .  HPI:   Patient seen sitting up in hospital chair this afternoon on my approach. It's difficult to assess the patient due to his severely garbled speech. He was able to report that he has been doing good in the hospital and he feels like the staff has been taking good care of him. He denied any thoughts of wanting to hurt himself or anyone else.  Per nursing staff the patient has been compliant with his medication. He has not required any PRN medication for agitation.  Past Psychiatric History: Bipolar Disorder  Risk to Self:   No Risk to Others:   No Prior Inpatient Therapy:   UTA Prior Outpatient Therapy:   UTA  Past Medical History:  Past Medical History:  Diagnosis Date   Acute renal failure ([place])    Bipolar disorder, unspecified ([place])    Diabetes mellitus    Hyperlipidemia    Hypothyroidism     Past Surgical History:  Procedure Laterality Date   OTHER SURGICAL HISTORY     lymph node removed from right arm    Family Psychiatric  History:  Social History:  Social History   Substance and Sexual Activity  Alcohol Use Not Currently   Comment: last night. daily     Social History   Substance and Sexual Activity  Drug Use Not Currently    Social History   Socioeconomic History   Marital status: Single    Spouse name: Not on file   Number of children: Not on file   Years of education: Not on file   Highest education level: Not on file  Occupational History   Occupation: retired Cabin crew  Tobacco Use   Smoking status: Former    Packs/day: 4.00    Years: 30.00    Total pack years: 120.00    Types: Cigarettes    Quit date: 06/11/2008    Years since quitting: 13.8   Smokeless tobacco: Not on file  Substance and Sexual Activity   Alcohol use: Not Currently    Comment: last night. daily   Drug use: Not Currently   Sexual activity: Not Currently  Other Topics Concern   Not on file  Social History Narrative   Not on file   Social Determinants of Health   Financial Resource Strain: Not on file  Food Insecurity: Not on file  Transportation Needs: Not on file  Physical Activity: Not on file  Stress: Not on file  Social Connections: Not on file   Additional Social History:    Allergies:  Allergies  Allergen Reactions   Budesonide-Formoterol Fumarate Swelling    [facility]   Trifluoperazine Other (See Comments)    Muscle aches    Labs:  Results for orders placed or performed during the hospital encounter of 03/27/2022 (from the past 48 hour(s))  Glucose, capillary     Status: Abnormal   Collection Time: 04/09/22  3:34 PM  Result Value Ref Range   Glucose-Capillary 120 (H) 70 - 99 mg/dL    Comment: Glucose reference range applies only to samples taken after fasting for at least 8 hours.  Glucose, capillary     Status: Abnormal   Collection Time: 04/09/22  7:26 PM  Result Value Ref Range   Glucose-Capillary 196 (H) 70 - 99 mg/dL    Comment:  Glucose reference range applies only to samples taken after fasting for at least 8 hours.  Glucose, capillary     Status: Abnormal   Collection Time: 04/09/22 11:22 PM  Result Value Ref Range   Glucose-Capillary 115 (H) 70 - 99 mg/dL    Comment: Glucose reference range applies only to samples taken after fasting for at least 8 hours.  Renal function panel     Status: Abnormal   Collection Time: 04/10/22  2:19 AM  Result Value Ref Range   Sodium 151 (H) 135 - 145 mmol/L   Potassium 4.7 3.5 - 5.1 mmol/L   Chloride 112 (H) 98 - 111 mmol/L   CO2 26 22 - 32 mmol/L   Glucose, Bld 157 (H) 70 - 99 mg/dL    Comment: Glucose reference range applies only to samples taken after fasting for at least 8 hours.   BUN 146 (H) 8 - 23 mg/dL   Creatinine, Ser 5.44 (H) 0.61 - 1.24 mg/dL   Calcium 8.2 (L) 8.9 - 10.3 mg/dL   Phosphorus 7.2 (H) 2.5 - 4.6 mg/dL   Albumin 2.9 (L) 3.5 - 5.0 g/dL   GFR, Estimated 11 (L) >60 mL/min    Comment: (NOTE) Calculated using the CKD-EPI Creatinine Equation (2021)    Anion gap 13 5 - 15    Comment: Performed at [facility] [address], [place], [place] [address]  Glucose, capillary     Status: Abnormal   Collection Time: 04/10/22  3:54 AM  Result Value Ref Range   Glucose-Capillary 164 (H) 70 - 99 mg/dL    Comment: Glucose reference range applies only to samples taken after fasting for at least 8 hours.  Glucose, capillary     Status: Abnormal   Collection Time: 04/10/22  7:35 AM  Result Value Ref Range   Glucose-Capillary 217 (H) 70 - 99 mg/dL    Comment: Glucose reference range applies only to samples taken after fasting for at least 8 hours.  Glucose, capillary     Status: Abnormal   Collection Time: 04/10/22 11:14 AM  Result Value Ref Range   Glucose-Capillary 134 (H) 70 - 99 mg/dL    Comment: Glucose reference range applies only to samples taken after fasting for at least 8 hours.  Glucose, capillary     Status: Abnormal   Collection Time:  04/10/22  3:32 PM  Result Value Ref Range   Glucose-Capillary 179 (H) 70 - 99 mg/dL    Comment: Glucose reference range applies only to samples taken after fasting for at least 8 hours.  Glucose, capillary     Status: Abnormal   Collection Time: 04/10/22  7:53 PM  Result Value Ref Range  Glucose-Capillary 166 (H) 70 - 99 mg/dL    Comment: Glucose reference range applies only to samples taken after fasting for at least 8 hours.  Glucose, capillary     Status: Abnormal   Collection Time: 04-16-22 12:15 AM  Result Value Ref Range   Glucose-Capillary 152 (H) 70 - 99 mg/dL    Comment: Glucose reference range applies only to samples taken after fasting for at least 8 hours.  Glucose, capillary     Status: Abnormal   Collection Time: 04-16-22  4:46 AM  Result Value Ref Range   Glucose-Capillary 138 (H) 70 - 99 mg/dL    Comment: Glucose reference range applies only to samples taken after fasting for at least 8 hours.  Glucose, capillary     Status: Abnormal   Collection Time: 2022-04-16  7:15 AM  Result Value Ref Range   Glucose-Capillary 159 (H) 70 - 99 mg/dL    Comment: Glucose reference range applies only to samples taken after fasting for at least 8 hours.  Vitamin B12     Status: None   Collection Time: 16-Apr-2022  8:18 AM  Result Value Ref Range   Vitamin B-12 906 180 - 914 pg/mL    Comment: (NOTE) This assay is not validated for testing neonatal or myeloproliferative syndrome specimens for Vitamin B12 levels. Performed at [facility], [place] [address], [place], [place] [address]   Folate     Status: Abnormal   Collection Time: 2022/04/16  8:18 AM  Result Value Ref Range   Folate 5.7 (L) >5.9 ng/mL    Comment: Performed at [facility], [place] [address], [place], [place] [address]  Iron and TIBC     Status: Abnormal   Collection Time: Apr 16, 2022  8:18 AM  Result Value Ref Range   Iron 81 45 - 182 ug/dL   TIBC 139 (L) 250 - 450 ug/dL   Saturation Ratios 58 (H) 17.9 - 39.5 %    UIBC 58 ug/dL    Comment: Performed at [facility] [address] [place], [place] [address]  Ferritin     Status: Abnormal   Collection Time: 2022-04-16  8:18 AM  Result Value Ref Range   Ferritin 348 (H) 24 - 336 ng/mL    Comment: Performed at [facility], [place] [address] [place], [place] [address]  Magnesium     Status: None   Collection Time: 04/16/22  8:18 AM  Result Value Ref Range   Magnesium 2.2 1.7 - 2.4 mg/dL    Comment: Performed at [facility] [address], [place], [place] [address]  Renal function panel     Status: Abnormal   Collection Time: 04-16-2022  8:18 AM  Result Value Ref Range   Sodium 142 135 - 145 mmol/L    Comment: DELTA CHECK NOTED   Potassium 5.2 (H) 3.5 - 5.1 mmol/L   Chloride 101 98 - 111 mmol/L   CO2 27 22 - 32 mmol/L   Glucose, Bld 158 (H) 70 - 99 mg/dL    Comment: Glucose reference range applies only to samples taken after fasting for at least 8 hours.   BUN 143 (H) 8 - 23 mg/dL   Creatinine, Ser 4.81 (H) 0.61 - 1.24 mg/dL   Calcium 8.2 (L) 8.9 - 10.3 mg/dL   Phosphorus 6.8 (H) 2.5 - 4.6 mg/dL   Albumin 2.5 (L) 3.5 - 5.0 g/dL   GFR, Estimated 12 (L) >60 mL/min  Comment: (NOTE) Calculated using the CKD-EPI Creatinine Equation (2021)    Anion gap 14 5 - 15    Comment: Performed at  [facility], [place] [address], [place], [place] [address]  TSH     Status: Abnormal   Collection Time: 05-04-2022  8:18 AM  Result Value Ref Range   TSH 28.942 (H) 0.350 - 4.500 uIU/mL    Comment: Performed by a 3rd Generation assay with a functional sensitivity of <=0.01 uIU/mL. Performed at [facility], [place] [address] [place], [place] [address]   Valproic acid level     Status: Abnormal   Collection Time: 05-04-2022  8:18 AM  Result Value Ref Range   Valproic Acid Lvl 15 (L) 50.0 - 100.0 ug/mL    Comment: Performed at [facility] [address], [place] [address]  CBC     Status: Abnormal   Collection Time:  May 04, 2022 10:53 AM  Result Value Ref Range   WBC 13.4 (H) 4.0 - 10.5 K/uL   RBC 2.06 (L) 4.22 - 5.81 MIL/uL   Hemoglobin 6.5 (LL) 13.0 - 17.0 g/dL    Comment: REPEATED TO VERIFY THIS CRITICAL RESULT HAS VERIFIED AND BEEN CALLED TO N JOHNSON RN BY DANIELLE LONG ON 03 03 2024 AT 1159, AND HAS BEEN READ BACK.     HCT 20.4 (L) 39.0 - 52.0 %   MCV 99.0 80.0 - 100.0 fL   MCH 31.6 26.0 - 34.0 pg   MCHC 31.9 30.0 - 36.0 g/dL   RDW 13.8 11.5 - 15.5 %   Platelets 64 (L) 150 - 400 K/uL    Comment: Immature Platelet Fraction may be clinically indicated, consider ordering this additional test [id number] REPEATED TO VERIFY PLATELET COUNT CONFIRMED BY SMEAR    nRBC 0.0 0.0 - 0.2 %    Comment: Performed at [facility], [place] [address], [place], [place] [address]  Reticulocytes     Status: Abnormal   Collection Time: May 04, 2022 10:53 AM  Result Value Ref Range   Retic Ct Pct 0.8 0.4 - 3.1 %   RBC. 2.06 (L) 4.22 - 5.81 MIL/uL   Retic Count, Absolute 15.5 (L) 19.0 - 186.0 K/uL   Immature Retic Fract 4.3 2.3 - 15.9 %    Comment: Performed at [facility] [address] [place], [place] [address]  Glucose, capillary     Status: Abnormal   Collection Time: May 04, 2022 11:34 AM  Result Value Ref Range   Glucose-Capillary 157 (H) 70 - 99 mg/dL    Comment: Glucose reference range applies only to samples taken after fasting for at least 8 hours.    Current Facility-Administered Medications  Medication Dose Route Frequency Provider Last Rate Last Admin   0.9 %  sodium chloride infusion (Manually program via Guardrails IV Fluids)   Intravenous Once Wendee Beavers T, MD       arformoterol (BROVANA) nebulizer solution 15 mcg  15 mcg Nebulization BID Simonne Maffucci B, MD   15 mcg at May 04, 2022 [id number]   Chlorhexidine Gluconate Cloth 2 % PADS 6 each  6 each Topical Daily Collier Bullock, MD   6 each at 04/10/22 0930   dextrose 5 % solution   Intravenous Continuous Rosita Fire, MD 50 mL/hr at  2022-05-04 1146 Rate Change at May 04, 2022 1146   divalproex (DEPAKOTE SPRINKLE) capsule 125 mg  125 mg Oral Q12H Derrill Center, NP   125 mg at 05/04/22 0916   feeding  supplement (OSMOLITE 1.2 CAL) liquid 1,000 mL  1,000 mL Per Tube Continuous Joselyn Glassman A, RPH 65 mL/hr at 04/10/22 2116 1,000 mL at 04/10/22 2116   free water 200 mL  200 mL Per Tube Ellender Hose, Whitney D, NP   200 mL at 2022/05/07 0215   hydrOXYzine (ATARAX) tablet 10 mg  10 mg Per Tube BID Wilson Singer I, RPH   10 mg at 2022-05-07 [id number]   insulin aspart (novoLOG) injection 0-15 Units  0-15 Units Subcutaneous Q4H Simonne Maffucci B, MD   3 Units at 05/07/22 [id number]   ipratropium-albuterol (DUONEB) 0.5-2.5 (3) MG/3ML nebulizer solution 3 mL  3 mL Nebulization Q6H PRN Juanito Doom, MD       levothyroxine (SYNTHROID) tablet 100 mcg  100 mcg Per Tube Q0600 Wilson Singer I, RPH   100 mcg at May 07, 2022 0455   metoCLOPramide (REGLAN) tablet 5 mg  5 mg Per Tube Q8H Wendee Beavers T, MD   5 mg at 05-07-22 0455   Oral care mouth rinse  15 mL Mouth Rinse PRN Juanito Doom, MD       Oral care mouth rinse  15 mL Mouth Rinse PRN Juanito Doom, MD       polyethylene glycol (MIRALAX / GLYCOLAX) packet 17 g  17 g Per Tube BID [name] D, NP   17 g at May 07, 2022 [id number]   QUEtiapine (SEROQUEL) tablet 200 mg  200 mg Per Tube TID Gleason, Otilio Carpen, PA-C   200 mg at 07-May-2022 [id number]   revefenacin (YUPELRI) nebulizer solution 175 mcg  175 mcg Nebulization Daily Simonne Maffucci B, MD   175 mcg at 05-07-22 [id number]   senna (SENOKOT) tablet 8.6 mg  1 tablet Per Tube BID [name] D, NP   8.6 mg at May 07, 2022 [id number]   sevelamer carbonate (RENVELA) tablet 800 mg  800 mg Per Tube TID WC Wendee Beavers T, MD   800 mg at 05-07-2022 0917   sodium zirconium cyclosilicate (LOKELMA) packet 10 g  10 g Per Tube Once Wendee Beavers T, MD       ziprasidone (GEODON) injection 10 mg  10 mg Intramuscular Q2H PRN Juanito Doom, MD   10 mg at 04/10/22 0354    Psychiatric Specialty Exam:  Presentation  General Appearance:  Disheveled  Eye Contact: Poor  Speech: Garbled  Speech Volume: Normal  Handedness: Right   Mood and Affect  Mood: -- (good)  Affect: Blunt   Thought Process  Thought Processes: -- (Difficult to assess due to garbled speech)  Descriptions of Associations:Loose  Orientation:Other (comment) (UTA due to garbled speech)  Thought Content:-- (No SI/HI)  History of Schizophrenia/Schizoaffective disorder:No data recorded Duration of Psychotic Symptoms:No data recorded Hallucinations:Hallucinations: None  Ideas of Reference:None  Suicidal Thoughts:Suicidal Thoughts: No  Homicidal Thoughts:Homicidal Thoughts: No   Sensorium  Memory: Immediate Poor; Recent Poor; Remote Poor  Judgment: Fair  Insight: Fair   Community education officer  Concentration: -- (Difficult to assess due to garbled speech)  Attention Span: -- (Difficult to assess due to garbled speech)  Recall: -- (Difficult to assess due to garbled speech)  Fund of Knowledge: -- (Difficult to assess due to garbled speech)  Language: -- (Difficult to assess due to garbled speech)   Psychomotor Activity  Psychomotor Activity:No data recorded  Assets  Assets: Other (comment)   Sleep  Sleep: Sleep: Good   Physical Exam: Physical Exam ROS Blood pressure 108/70, pulse 95, temperature 98 F (36.7 C), temperature source Oral, resp.  rate 18, weight 53.9 kg, SpO2 97 %. Body mass index is 20.4 kg/m.  Treatment Plan Summary: Daily contact with patient to assess and evaluate symptoms and progress in treatment and Medication management   Continue Depakote sprinkles 125 mg p.o. twice daily Valproic acid level 15 ug/mL 2022-04-21 Continue Seroquel 200 mg p.o. 3 times daily Continue Atarax 10 mg p.o. twice daily as needed   Disposition:  Chart reviewed:  Hepatic Function Panel:  AST/ALT 14/15, Magnesium 2.6, Creatinine 5.48,  Vitamin B-12 1,171   Pecolia Ades, DO 04/21/22 12:40 PM

## 2022-05-10 NOTE — Code Documentation (Signed)
Cardiopulmonary Resuscitation Note  Bradley Tran  [id number]  06-Jul-1950  Date:05-03-2022  Time:4:57 PM   Provider Performing:Sivan Cuello L Babita Amaker   Procedure: Cardiopulmonary Resuscitation ([address]50)  Indication(s) Loss of Pulse  Consent N/A  Anesthesia N/A   Time Out N/A   Sterile Technique Hand hygiene, gloves   Procedure Description Called to patient's room for CODE BLUE. Initial rhythm was PEA/Asystole. Patient received high quality chest compressions for ~63mns minutes with defibrillation or cardioversion when appropriate. Epinephrine was administered every 3 minutes as directed by time kTherapist, nutritional Additional pharmacologic interventions included calcium chloride. Additional procedural interventions include none.  Return of spontaneous circulation was not achieved. After an additional 10 mins we achieved ROSC with a faint pulse.   The pulse was not palpable but could barely hear bradycardic pulse with doppler and did not correlate with electric activity on telemetry. Patient remained hypotensive. Patient had pulses VT to start his third cardiac arrest and decision was made not to proceed with further resuscitative efforts.   TOD: 16:50   Family unable to be notified. No family    Complications/Tolerance N/A   EBL N/A   Specimen(s) N/A  Estimated time to ROSC: 0

## 2022-05-10 NOTE — Code Documentation (Addendum)
  Patient Name: Bradley Tran   MRN: [id number]   Date of Birth/ Sex: [birth year] , male      Admission Date: 04/06/2022  Attending Provider: Mercy Riding, MD  Primary Diagnosis: Acute respiratory failure with hypoxia and hypercapnia ([place])   Indication: Pt was in his usual state of health until this PM, when he was noted to have dark emesis then subsequently entered PEA arrest. Code blue was subsequently called. At the time of arrival on scene, ACLS protocol was underway.   Technical Description:  - CPR performance duration:  20 minutes  - Was defibrillation or cardioversion used? No   - Was external pacer placed? No  - Was patient intubated pre/post CPR? Yes   Medications Administered: Y = Yes; Blank = No Amiodarone    Atropine    Calcium  Y  Epinephrine  Y  Lidocaine    Magnesium    Norepinephrine    Phenylephrine    Sodium bicarbonate  Y  Vasopressin     Post CPR evaluation:  - Final Status - Was patient successfully resuscitated ? Yes - What is current rhythm? Sinus tachycardia with RBBB - What is current hemodynamic status? HDS (BP 122/80 upon ROSC)  Miscellaneous Information:  - Labs sent, including: CBC, BMP, Lactate, CXR  - Primary team notified?  Yes  - Family Notified? No family present or listed in emergency contacts  - Additional notes/ transfer status: Patient was started on blood transfusion for low hemoglobin, but only received this for about 1-2 minutes prior to arrest. After ROSC was achieved, patient transferred to 3M12 alongside PCCM. No emergency contacts listed.     Virl Axe, MD  2022-04-18, 4:02 PM

## 2022-05-10 NOTE — Progress Notes (Signed)
   2022/04/15 1600  Spiritual Encounters  Type of Visit Initial  Care provided to: Patient  Referral source Code page  Reason for visit Code  OnCall Visit Yes   Ch responded to code blue. No family at bedside. No follow-up needed at this time

## 2022-05-10 NOTE — Progress Notes (Addendum)
[facility]PH[facility] PROGRESS NOTE  Assessment/ Plan: Pt is a 72 y.o. yo male with a history of bipolar mood disorder, DM, COPD, homelessness, advanced longstanding CKD who was presented with acute respiratory failure with hypoxia seen as a consultation for CKD and electrolytes abnormalities.  # Acute kidney injury on CKD stage IV with azotemia: He has longstanding advanced CKD with creatinine level around 3-4.  AKI presumably hemodynamically mediated with dehydration.  He also has hypernatremia.  High BUN can also be contributed by recent steroid use. Receiving hypotonic fluid and free water with downtrending of creatinine and sodium level.  Urine output has increased to 2 L.  I am not sure how much he is able to cooperate for fluid intake.  Continue D5W and monitor lab. Given his comorbidities, agitation, he may not be a good candidate for long-term dialysis unless significant change in his condition gradually.  We will continue to follow.  Continue daily lab and strict ins and out.  Follow labs from today.  # Hypernatremia with free water deficit: He looks dry on exam.  Currently on free water from the tube and on D5W.  It would be nice if he can drink water.  Follow lab from today when available.  # Anemia: Iron saturation 68%, ordered a dose of Aranesp. Monitor hemoglobin.  # CKD-MBD-hyperphosphatemia: Check PTH level, started sevelamer when he takes orally.  # Hypotension: Blood pressure better today, on IV fluid.  Not on antihypertensive.    # Bipolar disorder with acute agitation: He was calmer this morning however really not cooperating with exam and not able to get history from him.  Seems like the mental status is gradually improving with his antipsych medications.  Seen by psychiatrist.  Addendum 11;36 AM: Lab reviewed. Labs improving. D52 lower to 50 cc/hr.  Subjective: The patient was seen and examined.  He was transferred out of ICU to the floor.  He is quite  somnolent and not agitated this morning.  Urine output is recorded around 2 L.  Still waiting for lab results from today.  Objective Vital signs in last 24 hours: Vitals:   04-24-2022 0446 24-Apr-2022 0717 04-24-2022 0823 04/24/22 0824  BP: 107/69 108/70    Pulse: 94 95    Resp: 18     Temp: 98.7 F (37.1 C) 98 F (36.7 C)    TempSrc:  Oral    SpO2: 100% 94% 96% 97%  Weight:       Weight change:   Intake/Output Summary (Last 24 hours) at 04-24-22 0941 Last data filed at 04/10/2022 1800 Gross per 24 hour  Intake 540.58 ml  Output 1800 ml  Net -1259.42 ml        Labs: RENAL PANEL Recent Labs  Lab 04/05/22 0705 04/06/22 0301 04/06/22 1116 04/06/22 1631 04/08/22 0826 04/08/22 0833 04/08/22 1836 04/09/22 0932 04/10/22 0219  NA 146* 149*  --   --   --  153*  --  156* 151*  K 3.9 4.7  --   --   --  4.7  --  5.0 4.7  CL 110 111  --   --   --  118*  --  118* 112*  CO2 21* 19*  --   --   --  23  --  25 26  GLUCOSE 115* 105*  --   --   --  111*  --  153* 157*  BUN 114* 132*  --   --   --  142*  --  144* 146*  CREATININE 5.80* 5.88*  --   --   --  5.48*  --  5.60* 5.44*  CALCIUM 8.1* 8.4*  --   --   --  8.7*  --  8.4* 8.2*  MG 1.9 2.6* 2.5* 2.5*  --  2.6* 2.6* 2.4  --   PHOS 5.6* 8.1* 8.5* 8.5*  --  8.2* 8.3* 7.6* 7.2*  ALBUMIN  --  2.9*  --   --  3.1*  --   --   --  2.9*     Liver Function Tests: Recent Labs  Lab 04/06/22 0301 04/08/22 0826 04/10/22 0219  AST 13* 14*  --   ALT 14 15  --   ALKPHOS 58 63  --   BILITOT 0.6 0.7  --   PROT 5.8* 6.1*  --   ALBUMIN 2.9* 3.1* 2.9*    No results for input(s): "LIPASE", "AMYLASE" in the last 168 hours. Recent Labs  Lab 04/08/22 1058  AMMONIA <10    CBC: Recent Labs    04/04/22 0351 04/05/22 0705 04/06/22 0301 04/08/22 0833 04/09/22 0932 04/09/22 1018  HGB 7.5* 7.8* 7.9* 7.4* 7.3*  --   MCV  --  97.3 100.4* 102.9* 102.5*  --   VITAMINB12  --   --   --   --  1,171*  --   FOLATE  --   --   --   --  8.9  --    FERRITIN  --   --   --   --   --  330  TIBC  --   --   --   --   --  153*  IRON  --   --   --   --   --  104     Cardiac Enzymes: No results for input(s): "CKTOTAL", "CKMB", "CKMBINDEX", "TROPONINI" in the last 168 hours. CBG: Recent Labs  Lab 04/10/22 1532 04/10/22 1953 05-01-2022 0015 05-01-2022 0446 May 01, 2022 0715  GLUCAP 179* 166* 152* 138* 159*     Iron Studies:  Recent Labs    04/09/22 1018  IRON 104  TIBC 153*  FERRITIN 330    Studies/Results: DG Abd Portable 1V  Result Date: 04/10/2022 CLINICAL DATA:  Constipation EXAM: PORTABLE ABDOMEN - 1 VIEW COMPARISON:  KUB April 07, 2022 FINDINGS: The weighted feeding tube terminates in the right mid abdomen, likely the distal stomach or proximal duodenum. Distal stomach is favored. Apparent distension of the stomach. Opacity in the right lung base. No other acute abnormalities. IMPRESSION: 1. The weighted feeding tube terminates in the right mid abdomen, likely the distal stomach or proximal duodenum. Distal stomach is favored. 2. Apparent distension of the stomach. 3. Opacity in the right lung base could represent atelectasis or infiltrate. Electronically Signed   By: Dorise Bullion III M.D.   On: 04/10/2022 14:17    Medications: Infusions:  dextrose 100 mL/hr at 04/10/22 1312   feeding supplement (OSMOLITE 1.2 CAL) 1,000 mL (04/10/22 2116)    Scheduled Medications:  arformoterol  15 mcg Nebulization BID   Chlorhexidine Gluconate Cloth  6 each Topical Daily   divalproex  125 mg Oral Q12H   free water  200 mL Per Tube Q2H   heparin injection (subcutaneous)  5,000 Units Subcutaneous Q8H   hydrOXYzine  10 mg Per Tube BID   insulin aspart  0-15 Units Subcutaneous Q4H   levothyroxine  100 mcg Per Tube Q0600   metoCLOPramide  5 mg Per Tube Q8H  polyethylene glycol  17 g Per Tube BID   QUEtiapine  200 mg Per Tube TID   revefenacin  175 mcg Nebulization Daily   senna  1 tablet Per Tube BID   sevelamer carbonate  800 mg  Per Tube TID WC    have reviewed scheduled and prn medications.  Physical Exam: General: Elderly looking male, somnolent, lying on bed comfortable Heart:RRR, s1s2 nl Lungs:clear b/l, no crackle Abdomen:soft, Non-tender, non-distended Extremities:No edema Neurology: Confused male  Bradley Tran April 15, 2022,9:41 AM  LOS: 8 days

## 2022-05-10 NOTE — Progress Notes (Signed)
PROGRESS NOTE  Bradley Tran [id number] DOB: 02/02/1951   PCP: [facility]  Patient is from: Homeless  DOA: 04/04/2022 LOS: 8  Chief complaints Chief Complaint  Patient presents with   Shortness of Breath     Brief Narrative / Interim history: 72 year old M with PMH of COPD, bipolar disorder, DM-2, hypothyroidism, PE in 12/2020 no longer on [facility], homelessness and noncompliance presenting with shortness of breath and admitted to ICU for acute respiratory failure with hypoxia in the setting of COPD exacerbation and COVID-19 infection, and severe agitation.  Reportedly tachypneic with saturation in 60s and 70s on room air.  He remained in respiratory distress despite  steroid, magnesium and nebulizers and  en route to ED. Cr 6.57 (baseline 4).  BUN 79.  BNP 158.  VBG 7.15/58/145/20.5.  Ethanol and UDS negative.  CXR without acute finding.  Patient was initially placed on BiPAP, then intubated on 2/24.  Patient was extubated on 2/26.  Came off Precedex on 2/29.  Psych consulted for ongoing agitation and started medications.  Patient was transferred to [facility] hospitalist service on 04/10/2022.  Remains delirious.  Required physical and pharmacological restraints.  Psychiatry and nephrology following.  Subjective: Seen and examined earlier this morning.  No major events overnight of this morning.  Off restraints this morning.  He is awake but not quite alert.  He is oriented to self and follows command  Objective: Vitals:   05-02-2022 0446 2022-05-02 0717 02-May-2022 0823 2022-05-02 0824  BP: 107/69 108/70    Pulse: 94 95    Resp: 18     Temp: 98.7 F (37.1 C) 98 F (36.7 C)    TempSrc:  Oral    SpO2: 100% 94% 96% 97%  Weight:        Examination:  GENERAL: No apparent distress.  Nontoxic. HEENT: MMM.  Vision and hearing grossly intact.  NECK: Supple.  No apparent JVD.  RESP:  No IWOB.  Fair aeration bilaterally. CVS:  RRR. Heart sounds normal.  ABD/GI/GU: BS+. Abd soft, NTND.   MSK/EXT:   No apparent deformity. Moves extremities.  Significant muscle mass and subcu fat loss. SKIN: no apparent skin lesion or wound NEURO: Awake but not quite alert.  Oriented to self.  Follows commands.  No focal neurodeficit. PSYCH: Calm. Normal affect.   Procedures:  2/24-2/26-intubation and mechanical ventilation.  Microbiology summarized: 2/23-COVID-19 PCR positive 2/24-MRSA PCR screen negative 2/24-blood cultures NGTD  Assessment and plan: Principal Problem:   Acute respiratory failure with hypoxia and hypercapnia ([name]) Active Problems:   AKI (acute kidney injury) ([place])   DM (diabetes mellitus), type 2 with renal complications ([name])   COPD exacerbation ([name])   CKD (chronic kidney disease) stage 4, GFR 15-29 ml/min ([name])   Bipolar disorder, unspecified ([name])   Gastroesophageal reflux disease   Hypothyroidism   COVID-19   Protein-calorie malnutrition, severe   Agitation   Anemia of chronic disease   Noncompliance   Constipation   Azotemia  Acute respiratory failure with hypoxia/hypercarbia due to COPD exacerbation and COVID-19 infection: Resolved. -Intubation and mechanical ventilation from 2/24-2/26. -Continue bronchodilators and pulmonary toilet.    Bipolar Disorder with Acute Agitation: Required restraints overnight. He is awake and alert but remains agitated.  Came off Precedex on 2/29.  BUN elevated to 146. -Psychiatry recs: Depakote, Seroquel and Atarax.  -Monitor QTc -Optimize electrolytes  AKI on CKD-4/azotemia/hypernatremia: Could be contributing to his mental status change although he is agitated not somnolent. BUN remains elevated to 143.  Creatinine  and hyponatremia improved Recent Labs    04/03/22 0538 04/03/22 2003 04/04/22 0101 04/04/22 1636 04/05/22 0705 04/06/22 0301 04/08/22 0833 04/09/22 0932 04/10/22 0219 04/27/2022 0818  BUN 83* 85* 86* 96* 114* 132* 142* 144* 146* 143*  CREATININE 6.47* 6.10* 6.04* 5.89* 5.80* 5.88* 5.48* 5.60*  5.44* 4.81*  -Nephrology following.  Concern about candidacy for long-term HD given his comorbidities and agitation. -Continue D5 water and monitor labs per nephrology -Avoid nephrotoxic meds.  Controlled IDDM-2 with hyperglycemia and CKD-4: A1c 6.5%. Recent Labs  Lab 04/10/22 1953 04/27/22 0015 2022-04-27 0446 04-27-22 0715 27-Apr-2022 1134  GLUCAP 166* 152* 138* 159* 157*  -Continue current insulin regimen.   Hx PE 12/2020: No longer on anticoagulation.  Lower extremity venous Doppler negative -Subcu heparin for prophylaxis  Acute urinary retention: Coude foley placed 2/24 -Voiding trial.   Essential hypertension: Normotensive.   Anemia of chronic disease/thrombocytopenia: Thrombocytopenia is chronic.  No evidence of bleeding. Recent Labs    04/03/22 1208 04/03/22 1433 04/03/22 2134 04/04/22 0101 04/04/22 0351 04/05/22 0705 04/06/22 0301 04/08/22 0833 04/09/22 0932 04/27/2022 1053  HGB 8.5* 8.2* 7.5* 7.1* 7.5* 7.8* 7.9* 7.4* 7.3* 6.5*  -Transfuse 1 unit. -Discontinue subcu heparin.  SCD for VTE prophylaxis. -Monitor H&H  Hypothyroidism -Continue PO Synthroid    Constipation: LBM?  KUB without constipation. -Scheduled MiraLAX and senna twice daily until he has bowel movement   Medication noncompliance: Due to underlying psych issue? -Will counsel once able to comprehend  Severe malnutrition Body mass index is 20.4 kg/m. Nutrition Problem: Severe Malnutrition Etiology: chronic illness (COPD, also suspect component of social/environmental circumstances impacting nutrition status in setting of homelessness prior to admission) Signs/Symptoms: severe fat depletion, severe muscle depletion Interventions: Refer to RD note for recommendations   DVT prophylaxis:  Place and maintain sequential compression device Start: 04/27/22 1209  Code Status: Full code Family Communication: No family member listed Level of care: Telemetry Medical Status is: Inpatient Remains  inpatient appropriate because: Respiratory failure, AKI and agitation   Final disposition:  Consultants:  Pulmonology admitted patient Nephrology Psychiatry  55 minutes with more than 50% spent in reviewing records, counseling patient/family and coordinating care.   Sch Meds:  Scheduled Meds:  sodium chloride   Intravenous Once   arformoterol  15 mcg Nebulization BID   Chlorhexidine Gluconate Cloth  6 each Topical Daily   divalproex  125 mg Oral Q12H   free water  200 mL Per Tube Q2H   hydrOXYzine  10 mg Per Tube BID   insulin aspart  0-15 Units Subcutaneous Q4H   levothyroxine  100 mcg Per Tube Q0600   metoCLOPramide  5 mg Per Tube Q8H   polyethylene glycol  17 g Per Tube BID   QUEtiapine  200 mg Per Tube TID   revefenacin  175 mcg Nebulization Daily   senna  1 tablet Per Tube BID   sevelamer carbonate  800 mg Per Tube TID WC   Continuous Infusions:  dextrose 50 mL/hr at Apr 27, 2022 1146   feeding supplement (OSMOLITE 1.2 CAL) 1,000 mL (04/10/22 2116)   PRN Meds:.ipratropium-albuterol, mouth rinse, mouth rinse, ziprasidone  Antimicrobials: Anti-infectives (From admission, onward)    Start     Dose/Rate Route Frequency Ordered Stop   04/08/22 0831  doxycycline (VIBRA-TABS) tablet 100 mg        100 mg Per Tube Every 12 hours 04/08/22 0831 04/09/22 2038   04/06/22 1200  doxycycline (VIBRA-TABS) tablet 100 mg  Status:  Discontinued  100 mg Per Tube Every 12 hours 04/06/22 1017 04/06/22 1058   04/06/22 1200  doxycycline (VIBRAMYCIN) 100 mg in sodium chloride 0.9 % 250 mL IVPB  Status:  Discontinued        100 mg 125 mL/hr over 120 Minutes Intravenous Every 12 hours 04/06/22 1058 04/08/22 0831   04/06/22 1000  doxycycline (VIBRA-TABS) tablet 100 mg  Status:  Discontinued        100 mg Per Tube Every 12 hours 04/05/22 1352 04/06/22 0747   04/06/22 1000  doxycycline (VIBRA-TABS) tablet 100 mg  Status:  Discontinued        100 mg Oral Every 12 hours 04/06/22 0747 04/06/22  1017   04/05/22 1445  doxycycline (VIBRAMYCIN) 100 mg in sodium chloride 0.9 % 250 mL IVPB        100 mg 125 mL/hr over 120 Minutes Intravenous  Once 04/05/22 1352 04/05/22 1627   04/05/22 1400  doxycycline (VIBRA-TABS) tablet 100 mg  Status:  Discontinued        100 mg Per Tube Every 12 hours 04/05/22 1023 04/05/22 1352   04/04/22 1000  remdesivir 100 mg in sodium chloride 0.9 % 100 mL IVPB  Status:  Discontinued       See Hyperspace for full Linked Orders Report.   100 mg 200 mL/hr over 30 Minutes Intravenous Daily 04/03/22 0132 04/05/22 1154   04/03/22 1300  doxycycline (VIBRAMYCIN) 100 mg in sodium chloride 0.9 % 250 mL IVPB  Status:  Discontinued        100 mg 125 mL/hr over 120 Minutes Intravenous Every 12 hours 04/03/22 1207 04/05/22 1023   04/03/22 1245  doxycycline (VIBRA-TABS) tablet 100 mg  Status:  Discontinued        100 mg Oral Every 12 hours 04/03/22 1159 04/03/22 1206   04/03/22 0230  remdesivir 200 mg in sodium chloride 0.9% 250 mL IVPB       See Hyperspace for full Linked Orders Report.   200 mg 580 mL/hr over 30 Minutes Intravenous Once 04/03/22 0132 04/03/22 0437        I have personally reviewed the following labs and images: CBC: Recent Labs  Lab 04/05/22 0705 04/06/22 0301 04/08/22 0833 04/09/22 0932 Apr 29, 2022 1053  WBC 7.3 8.8 10.7* 8.7 13.4*  HGB 7.8* 7.9* 7.4* 7.3* 6.5*  HCT 25.0* 25.3* 24.9* 24.3* 20.4*  MCV 97.3 100.4* 102.9* 102.5* 99.0  PLT 149* 138* 126* 101* 64*   BMP &GFR Recent Labs  Lab 04/06/22 0301 04/06/22 1116 04/06/22 1631 04/08/22 0833 04/08/22 1836 04/09/22 0932 04/10/22 0219 April 29, 2022 0818  NA 149*  --   --  153*  --  156* 151* 142  K 4.7  --   --  4.7  --  5.0 4.7 5.2*  CL 111  --   --  118*  --  118* 112* 101  CO2 19*  --   --  23  --  '25 26 27  '$ GLUCOSE 105*  --   --  111*  --  153* 157* 158*  BUN 132*  --   --  142*  --  144* 146* 143*  CREATININE 5.88*  --   --  5.48*  --  5.60* 5.44* 4.81*  CALCIUM 8.4*  --   --   8.7*  --  8.4* 8.2* 8.2*  MG 2.6*   < > 2.5* 2.6* 2.6* 2.4  --  2.2  PHOS 8.1*   < > 8.5* 8.2* 8.3* 7.6* 7.2* 6.8*   < > =  values in this interval not displayed.   Estimated Creatinine Clearance: 11.1 mL/min (A) (by C-G formula based on SCr of 4.81 mg/dL (H)). Liver & Pancreas: Recent Labs  Lab 04/06/22 0301 04/08/22 0826 04/10/22 0219 04/25/2022 0818  AST 13* 14*  --   --   ALT 14 15  --   --   ALKPHOS 58 63  --   --   BILITOT 0.6 0.7  --   --   PROT 5.8* 6.1*  --   --   ALBUMIN 2.9* 3.1* 2.9* 2.5*   No results for input(s): "LIPASE", "AMYLASE" in the last 168 hours. Recent Labs  Lab 04/08/22 1058  AMMONIA <10   Diabetic: No results for input(s): "HGBA1C" in the last 72 hours. Recent Labs  Lab 04/10/22 1953 04/25/2022 0015 2022-04-25 0446 Apr 25, 2022 0715 Apr 25, 2022 1134  GLUCAP 166* 152* 138* 159* 157*   Cardiac Enzymes: No results for input(s): "CKTOTAL", "CKMB", "CKMBINDEX", "TROPONINI" in the last 168 hours. No results for input(s): "PROBNP" in the last 8760 hours. Coagulation Profile: No results for input(s): "INR", "PROTIME" in the last 168 hours. Thyroid Function Tests: Recent Labs    04-25-22 0818  TSH 28.942*   Lipid Profile: No results for input(s): "CHOL", "HDL", "LDLCALC", "TRIG", "CHOLHDL", "LDLDIRECT" in the last 72 hours. Anemia Panel: Recent Labs    04/09/22 0932 04/09/22 1018 2022-04-25 0818 04-25-2022 1053  VITAMINB12 1,171*  --  906  --   FOLATE 8.9  --  5.7*  --   FERRITIN  --  330 348*  --   TIBC  --  153* 139*  --   IRON  --  104 81  --   RETICCTPCT  --   --   --  0.8   Urine analysis:    Component Value Date/Time   COLORURINE STRAW (A) 04/04/2022 0409   APPEARANCEUR CLEAR 04/04/2022 0409   LABSPEC 1.010 04/04/2022 0409   PHURINE 5.0 04/04/2022 0409   GLUCOSEU NEGATIVE 04/04/2022 0409   HGBUR NEGATIVE 04/04/2022 0409   BILIRUBINUR NEGATIVE 04/04/2022 0409   KETONESUR NEGATIVE 04/04/2022 0409   PROTEINUR 30 (A) 04/04/2022 0409    UROBILINOGEN 0.2 01/13/2010 0944   NITRITE NEGATIVE 04/04/2022 0409   LEUKOCYTESUR TRACE (A) 04/04/2022 0409   Sepsis Labs: Invalid input(s): "PROCALCITONIN", "LACTICIDVEN"  Microbiology: Recent Results (from the past 240 hour(s))  Resp panel by RT-PCR (RSV, Flu A&B, Covid) Anterior Nasal Swab     Status: Abnormal   Collection Time: 03/28/2022  9:36 PM   Specimen: Anterior Nasal Swab  Result Value Ref Range Status   SARS Coronavirus 2 by RT PCR POSITIVE (A) NEGATIVE Final   Influenza A by PCR NEGATIVE NEGATIVE Final   Influenza B by PCR NEGATIVE NEGATIVE Final    Comment: (NOTE) The Xpert Xpress SARS-CoV-2/FLU/RSV plus assay is intended as an aid in the diagnosis of influenza from Nasopharyngeal swab specimens and should not be used as a sole basis for treatment. Nasal washings and aspirates are unacceptable for Xpert Xpress SARS-CoV-2/FLU/RSV testing.  Fact Sheet for Patients: EntrepreneurPulse.com.au  Fact Sheet for Healthcare Providers: IncredibleEmployment.be  This test is not yet approved or cleared by the [place] FDA and has been authorized for detection and/or diagnosis of SARS-CoV-2 by FDA under an Emergency Use Authorization (EUA). This EUA will remain in effect (meaning this test can be used) for the duration of the COVID-19 declaration under Section 564(b)(1) of the Act, 21 U.S.C. section 360bbb-3(b)(1), unless the authorization is terminated or revoked.  Resp Syncytial Virus by PCR NEGATIVE NEGATIVE Final    Comment: (NOTE) Fact Sheet for Patients: EntrepreneurPulse.com.au  Fact Sheet for Healthcare Providers: IncredibleEmployment.be  This test is not yet approved or cleared by the [place] FDA and has been authorized for detection and/or diagnosis of SARS-CoV-2 by FDA under an Emergency Use Authorization (EUA). This EUA will remain in effect (meaning this test can be used)  for the duration of the COVID-19 declaration under Section 564(b)(1) of the Act, 21 U.S.C. section 360bbb-3(b)(1), unless the authorization is terminated or revoked.  Performed at [facility], [place] [address], [place], [place] [address]   Culture, blood (routine x 2)     Status: None   Collection Time: 04/03/22  1:36 AM   Specimen: BLOOD  Result Value Ref Range Status   Specimen Description BLOOD LEFT ANTECUBITAL  Final   Special Requests   Final    BOTTLES DRAWN AEROBIC ONLY Blood Culture results may not be optimal due to an inadequate volume of blood received in culture bottles   Culture   Final    NO GROWTH 5 DAYS Performed at [facility], [place] [address], [place], [place] [address]    Report Status 04/08/2022 FINAL  Final  Culture, blood (routine x 2)     Status: None   Collection Time: 04/03/22  1:42 AM   Specimen: BLOOD LEFT FOREARM  Result Value Ref Range Status   Specimen Description BLOOD LEFT FOREARM  Final   Special Requests   Final    BOTTLES DRAWN AEROBIC AND ANAEROBIC Blood Culture adequate volume   Culture   Final    NO GROWTH 5 DAYS Performed at [facility], [place] [address], [place], [place] [address]    Report Status 04/08/2022 FINAL  Final  MRSA Next Gen by PCR, Nasal     Status: None   Collection Time: 04/03/22  5:43 AM   Specimen: Nasal Mucosa; Nasal Swab  Result Value Ref Range Status   MRSA by PCR Next Gen NOT DETECTED NOT DETECTED Final    Comment: (NOTE) The GeneXpert MRSA Assay (FDA approved for NASAL specimens only), is one component of a comprehensive MRSA colonization surveillance program. It is not intended to diagnose MRSA infection nor to guide or monitor treatment for MRSA infections. Test performance is not FDA approved in patients less than 34 years old. Performed at [facility], [place] [address], [place],  [address]     Radiology Studies: DG Abd Portable 1V  Result Date: 04/10/2022 CLINICAL DATA:   Constipation EXAM: PORTABLE ABDOMEN - 1 VIEW COMPARISON:  KUB April 07, 2022 FINDINGS: The weighted feeding tube terminates in the right mid abdomen, likely the distal stomach or proximal duodenum. Distal stomach is favored. Apparent distension of the stomach. Opacity in the right lung base. No other acute abnormalities. IMPRESSION: 1. The weighted feeding tube terminates in the right mid abdomen, likely the distal stomach or proximal duodenum. Distal stomach is favored. 2. Apparent distension of the stomach. 3. Opacity in the right lung base could represent atelectasis or infiltrate. Electronically Signed   By: Dorise Bullion III M.D.   On: 04/10/2022 14:17      Dareld Mcauliffe T. [place]  If 7PM-7AM, please contact night-coverage [email or web address] Apr 21, 2022, 12:08 PM

## 2022-05-10 NOTE — Death Summary Note (Signed)
DEATH SUMMARY   Patient Details  Name: Bradley Tran MRN: [id number] DOB: [birth year]  Admission/Discharge Information   Admit Date:  Apr 11, 2022  Date of Death:   20-Apr-2022  Time of Death:   16:50  Length of Stay: 8  Referring Physician: [facility]   Reason(s) for Hospitalization  Patient is a 72 year old male with pertinent PMH COPD, bipolar, DMT2, hypothyroidism, PE in nov 2022 no longer on ac presents to [facility] ED on 05/04/2022 with acute respiratory failure with hypoxia.  Patient is homeless.  On 04/26/2022 patient came to [facility] long ED for difficulty breathing.  Patient was belligerent and cussing at staff.  Patient was unwilling to have workup done and was sent home.  Later today patient was witnessed by bystanders having some increased SOB.  EMS called.  Patient tachypneic with sats 60-70% on room air.  Distant breath sounds.  Given breathing treatments, mag, and IV steroids.   Transferred to [facility] ED.   Upon arrival to [facility] ED patient still in respiratory distress.  Patient somnolent but arousable.  States he feels slightly better with breathing treatments.  States that a week ago he was diagnosed with COVID.  He has productive cough with bodyaches.  Vital stable.  Tmax 99.3 F.  VBG showing 7.15, 58, 145, 20.5.  Patient placed on BiPAP.  COVID-positive.  Cultures obtained.  CXR with no significant findings. Patient had episode of agitation ripping off bipap and pulling out iv's. Given ativan. PCCM consulted for icu admission.   Pertinent ED labs: WBC 7.1, Hgb 9, ethanol WNL, UDS and UA pending, creat 6.57 (baseline 4), BUN 79, GFR 9, troponin 9, bnp 158.  Diagnoses  Preliminary cause of death:  Secondary Diagnoses (including complications and co-morbidities):  Principal Problem:   Acute respiratory failure with hypoxia and hypercapnia ([name]) Active Problems:   AKI (acute kidney injury) ([place])   DM (diabetes mellitus), type 2 with renal complications ([name])   COPD exacerbation ([name])    CKD (chronic kidney disease) stage 4, GFR 15-29 ml/min ([name])   Bipolar disorder, unspecified ([name])   Gastroesophageal reflux disease   Hypothyroidism   COVID-19   Protein-calorie malnutrition, severe   Agitation   Anemia of chronic disease   Noncompliance   Constipation   Azotemia   Brief Hospital Course (including significant findings, care, treatment, and services provided and events leading to death)   Patient is a 72 year old male with pertinent PMH COPD, bipolar, DMT2, hypothyroidism, PE in nov 2022 no longer on ac presents to [facility] ED on 05/07/2022 with acute respiratory failure with hypoxia.   Patient is homeless.  On 04/14/2022 patient came to [facility] long ED for difficulty breathing.  Patient was belligerent and cussing at staff.  Patient was unwilling to have workup done and was sent home.  Later today patient was witnessed by bystanders having some increased SOB.  EMS called.  Patient tachypneic with sats 60-70% on room air.  Distant breath sounds.  Given breathing treatments, mag, and IV steroids.   Transferred to [facility] ED.   Upon arrival to [facility] ED patient still in respiratory distress.  Patient somnolent but arousable.  States he feels slightly better with breathing treatments.  States that a week ago he was diagnosed with COVID.  He has productive cough with bodyaches.  Vital stable.  Tmax 99.3 F.  VBG showing 7.15, 58, 145, 20.5.  Patient placed on BiPAP.  COVID-positive.  Cultures obtained.  CXR with no significant findings. Patient had episode of agitation ripping  off bipap and pulling out iv's. Given ativan. PCCM consulted for icu admission.   Pertinent ED labs: WBC 7.1, Hgb 9, ethanol WNL, UDS and UA pending, creat 6.57 (baseline 4), BUN 79, GFR 9, troponin 9, bnp 158.  2/23 admitted to [facility] resp failure on bipap 2/24 Intubated 2/26 No acute events overnight, remains on vent 40 FIO2, low min ventilation on SBT trial this am  2/27 extubated successfully yesterday, agitated overnight  requiring precedex and haldol 2/28 still on precedex this morning, passed swallow 2/29 unable to wean off precedex 3/1 off precedex gtt, verbally and physically aggressive, psych consult  3/3 cardiac arrest, aspirated coded on floor   PEA cardiac arrest Dark vomitus, possible upper GI bleed Acute respiratory failure with hypoxia/hypercarbia COPD exacerbation COVID positive Bipolar Disorder with Acute Agitation Type 2 diabetes  Hx PE 12/2020  -Self discontinued anticoagulation 03/2021.  Risk for VTE in setting of COVID AKI on CKD 4  Hyperkalemia  Urinary retention Hx of HTN/HLD  Anemia, possible upper GI bleed? Thrombocytopenia  Hypothyroidism Constipation Medication noncompliance   The patient continued to have refractory cardiac arrest. He developed PEA again. And shortly regained rosc each time. Ultimately the decision was made to called the CODE at 16:50. The patient passed in the ICU.   Pertinent Labs and Studies  Significant Diagnostic Studies DG CHEST PORT 1 VIEW  Result Date: 04/23/2022 CLINICAL DATA:  [id number] Chronic hypoxic respiratory failure ([place]) [id number] EXAM: PORTABLE CHEST 1 VIEW COMPARISON:  April 03, 2022 FINDINGS: The cardiomediastinal silhouette is unchanged in contour.ETT tip terminates 1 cm above the carina. The enteric tube courses through the chest to the abdomen beyond the field-of-view. Atherosclerotic calcifications. Trace LEFT pleural effusion. No pneumothorax. Increased hazy opacities of the RIGHT upper lung early. Mildly increased reticulonodular opacities at the LEFT lung base. IMPRESSION: 1. ETT tip terminates 1 cm above the carina. 2. Increased hazy opacities of the RIGHT upper lung and LEFT lung base. Differential considerations include infection, aspiration or atelectasis. Electronically Signed   By: Valentino Saxon M.D.   On: 04-23-2022 16:39   DG Abd Portable 1V  Result Date: 04/10/2022 CLINICAL DATA:  Constipation EXAM: PORTABLE ABDOMEN - 1  VIEW COMPARISON:  KUB April 07, 2022 FINDINGS: The weighted feeding tube terminates in the right mid abdomen, likely the distal stomach or proximal duodenum. Distal stomach is favored. Apparent distension of the stomach. Opacity in the right lung base. No other acute abnormalities. IMPRESSION: 1. The weighted feeding tube terminates in the right mid abdomen, likely the distal stomach or proximal duodenum. Distal stomach is favored. 2. Apparent distension of the stomach. 3. Opacity in the right lung base could represent atelectasis or infiltrate. Electronically Signed   By: Dorise Bullion III M.D.   On: 04/10/2022 14:17   DG Abd Portable 1V  Result Date: 04/07/2022 CLINICAL DATA:  Encounter for feeding tube placement. EXAM: PORTABLE ABDOMEN - 1 VIEW COMPARISON:  None Available. FINDINGS: Tip of the weighted enteric tube is in the right abdomen in the region of the distal stomach. There is gaseous gastric distension. No small bowel dilatation. IMPRESSION: Tip of the weighted enteric tube in the right abdomen in the region of the distal stomach. Gaseous gastric distension. Electronically Signed   By: Keith Rake M.D.   On: 04/07/2022 15:19   ECHOCARDIOGRAM COMPLETE  Result Date: 04/07/2022    ECHOCARDIOGRAM REPORT   Patient Name:   KINGJOSIAH RECENDEZ Date of Exam: 04/07/2022 Medical Rec #:  [id number]   Height:  64.0 in Accession #:    [id number]  Weight:       118.4 lb Date of Birth:  Nov 23, 1950    BSA:          1.566 m Patient Age:    55 years    BP:           148/78 mmHg Patient Gender: M           HR:           71 bpm. Exam Location:  Inpatient Procedure: Limited Echo Indications:    Hypoxia  History:        Patient has prior history of Echocardiogram examinations, most                 recent 12/29/2020. COPD; Risk Factors:Diabetes and Current                 Smoker. CKD, stage 4.  Sonographer:    Ronny Flurry Referring Phys: [id number] LAURA R GLEASON  Sonographer Comments: Image acquisition  challenging due to uncooperative patient. IMPRESSIONS  1. Left ventricular ejection fraction, by estimation, is 60 to 65%. The left ventricle has normal function. The left ventricle has no regional wall motion abnormalities. Left ventricular diastolic function could not be evaluated.  2. Right ventricular systolic function is normal. The right ventricular size is normal.  3. The mitral valve is grossly normal.  4. The aortic valve is grossly normal.  5. Limited study due to patient combativeness. Only 10 images obtained (all parasternal long axis). No Doppler interrogation performed. Overall LV and RV function appear normal.Limited stu FINDINGS  Left Ventricle: Left ventricular ejection fraction, by estimation, is 60 to 65%. The left ventricle has normal function. The left ventricle has no regional wall motion abnormalities. The left ventricular internal cavity size was normal in size. There is  no left ventricular hypertrophy. Left ventricular diastolic function could not be evaluated. Right Ventricle: The right ventricular size is normal. No increase in right ventricular wall thickness. Right ventricular systolic function is normal. Left Atrium: Left atrial size was normal in size. Right Atrium: Right atrial size was not assessed. Pericardium: There is no evidence of pericardial effusion. Mitral Valve: The mitral valve is grossly normal. No evidence of mitral valve regurgitation. No evidence of mitral valve stenosis. Tricuspid Valve: The tricuspid valve is not assessed. Tricuspid valve regurgitation is not demonstrated. No evidence of tricuspid stenosis. Aortic Valve: The aortic valve is grossly normal. Aortic valve regurgitation is not visualized. No aortic stenosis is present. Pulmonic Valve: The pulmonic valve was not assessed. Pulmonic valve regurgitation is not visualized. No evidence of pulmonic stenosis. Aorta: The aortic root is normal in size and structure. Venous: The inferior vena cava was not well  visualized. IAS/Shunts: No atrial level shunt detected by color flow Doppler.  LEFT VENTRICLE PLAX 2D LVIDd:         3.70 cm LVIDs:         2.80 cm LV PW:         1.40 cm LV IVS:        0.90 cm LVOT diam:     2.10 cm LVOT Area:     3.46 cm  LEFT ATRIUM         Index LA diam:    3.90 cm 2.49 cm/m   AORTA Ao Root diam: 3.40 cm  SHUNTS Systemic Diam: 2.10 cm Glori Bickers MD Electronically signed by Glori Bickers MD Signature Date/Time: 04/07/2022/10:15:35 AM  Final    VAS [place] LOWER EXTREMITY VENOUS (DVT)  Result Date: 04/06/2022  Lower Venous DVT Study Patient Name:  BRALEN DESCHAINE  Date of Exam:   04/06/2022 Medical Rec #: [id number]    Accession #:    [id number] Date of Birth: 1950-05-17     Patient Gender: M Patient Age:   63 years Exam Location:  [facility] Procedure:      VAS [place] LOWER EXTREMITY VENOUS (DVT) Referring Phys: Elease Etienne --------------------------------------------------------------------------------  Indications: Edema, and SOB.  Risk Factors: Hx in 2022. Anticoagulation: Heparin. Comparison Study: No prior study. Performing Technologist: McKayla Maag RVT, VT  Examination Guidelines: A complete evaluation includes B-mode imaging, spectral Doppler, color Doppler, and power Doppler as needed of all accessible portions of each vessel. Bilateral testing is considered an integral part of a complete examination. Limited examinations for reoccurring indications may be performed as noted. The reflux portion of the exam is performed with the patient in reverse Trendelenburg.  +---------+---------------+---------+-----------+----------+--------------+ RIGHT    CompressibilityPhasicitySpontaneityPropertiesThrombus Aging +---------+---------------+---------+-----------+----------+--------------+ CFV      Full           Yes      Yes                                 +---------+---------------+---------+-----------+----------+--------------+ SFJ      Full                                                         +---------+---------------+---------+-----------+----------+--------------+ FV Prox  Full                                                        +---------+---------------+---------+-----------+----------+--------------+ FV Mid   Full                                                        +---------+---------------+---------+-----------+----------+--------------+ FV DistalFull                                                        +---------+---------------+---------+-----------+----------+--------------+ PFV      Full                                                        +---------+---------------+---------+-----------+----------+--------------+ POP      Full           Yes      Yes                                 +---------+---------------+---------+-----------+----------+--------------+ PTV  Full                                                        +---------+---------------+---------+-----------+----------+--------------+ PERO     Full                                                        +---------+---------------+---------+-----------+----------+--------------+   +---------+---------------+---------+-----------+----------+--------------+ LEFT     CompressibilityPhasicitySpontaneityPropertiesThrombus Aging +---------+---------------+---------+-----------+----------+--------------+ CFV      Full           Yes      Yes                                 +---------+---------------+---------+-----------+----------+--------------+ SFJ      Full                                                        +---------+---------------+---------+-----------+----------+--------------+ FV Prox  Full                                                        +---------+---------------+---------+-----------+----------+--------------+ FV Mid   Full                                                         +---------+---------------+---------+-----------+----------+--------------+ FV DistalFull                                                        +---------+---------------+---------+-----------+----------+--------------+ PFV      Full                                                        +---------+---------------+---------+-----------+----------+--------------+ POP      Full           Yes      Yes                                 +---------+---------------+---------+-----------+----------+--------------+ PTV      Full                                                        +---------+---------------+---------+-----------+----------+--------------+  PERO     Full                                                        +---------+---------------+---------+-----------+----------+--------------+     Summary: RIGHT: - There is no evidence of deep vein thrombosis in the lower extremity.  - No cystic structure found in the popliteal fossa.  LEFT: - There is no evidence of deep vein thrombosis in the lower extremity.  - A cystic structure is found in the popliteal fossa.  *See table(s) above for measurements and observations. Electronically signed by Deitra Mayo MD on 04/06/2022 at 2:50:38 PM.    Final    DG CHEST PORT 1 VIEW  Result Date: 04/03/2022 CLINICAL DATA:  [id number] Acute hypoxemic respiratory failure ([place]) [id number] EXAM: PORTABLE CHEST 1 VIEW COMPARISON:  03/23/2022 FINDINGS: Interval placement of endotracheal tube with distal tip terminating approximately 2.1 cm above the carina. Interval placement of enteric tube with distal tip and side port projecting within the proximal stomach. Stable heart size. Aortic atherosclerosis. No new airspace consolidation. No pleural effusion or pneumothorax. IMPRESSION: 1. Interval placement of endotracheal and enteric tubes, as described. 2. Otherwise stable chest. Electronically Signed   By: Davina Poke D.O.   On: 04/03/2022 15:23    DG Chest Port 1 View  Result Date: 04/03/2022 CLINICAL DATA:  Shortness of breath EXAM: PORTABLE CHEST 1 VIEW COMPARISON:  12/06/2021 FINDINGS: Stable cardiomediastinal silhouette. Aortic atherosclerotic calcification. Chronic bronchitic changes. No focal consolidation, pleural effusion, or pneumothorax. Remote right rib fractures. IMPRESSION: No acute cardiopulmonary process. Electronically Signed   By: Placido Sou M.D.   On: 03/14/2022 22:18    Microbiology Recent Results (from the past 240 hour(s))  Resp panel by RT-PCR (RSV, Flu A&B, Covid) Anterior Nasal Swab     Status: Abnormal   Collection Time: 03/29/2022  9:36 PM   Specimen: Anterior Nasal Swab  Result Value Ref Range Status   SARS Coronavirus 2 by RT PCR POSITIVE (A) NEGATIVE Final   Influenza A by PCR NEGATIVE NEGATIVE Final   Influenza B by PCR NEGATIVE NEGATIVE Final    Comment: (NOTE) The Xpert Xpress SARS-CoV-2/FLU/RSV plus assay is intended as an aid in the diagnosis of influenza from Nasopharyngeal swab specimens and should not be used as a sole basis for treatment. Nasal washings and aspirates are unacceptable for Xpert Xpress SARS-CoV-2/FLU/RSV testing.  Fact Sheet for Patients: EntrepreneurPulse.com.au  Fact Sheet for Healthcare Providers: IncredibleEmployment.be  This test is not yet approved or cleared by the [place] FDA and has been authorized for detection and/or diagnosis of SARS-CoV-2 by FDA under an Emergency Use Authorization (EUA). This EUA will remain in effect (meaning this test can be used) for the duration of the COVID-19 declaration under Section 564(b)(1) of the Act, 21 U.S.C. section 360bbb-3(b)(1), unless the authorization is terminated or revoked.     Resp Syncytial Virus by PCR NEGATIVE NEGATIVE Final    Comment: (NOTE) Fact Sheet for Patients: EntrepreneurPulse.com.au  Fact Sheet for Healthcare  Providers: IncredibleEmployment.be  This test is not yet approved or cleared by the [place] FDA and has been authorized for detection and/or diagnosis of SARS-CoV-2 by FDA under an Emergency Use Authorization (EUA). This EUA will remain in effect (meaning this test can be used) for the duration of the COVID-19 declaration under  Section 564(b)(1) of the Act, 21 U.S.C. section 360bbb-3(b)(1), unless the authorization is terminated or revoked.  Performed at [facility], [place] [address], [place], [place] [address]   Culture, blood (routine x 2)     Status: None   Collection Time: 04/03/22  1:36 AM   Specimen: BLOOD  Result Value Ref Range Status   Specimen Description BLOOD LEFT ANTECUBITAL  Final   Special Requests   Final    BOTTLES DRAWN AEROBIC ONLY Blood Culture results may not be optimal due to an inadequate volume of blood received in culture bottles   Culture   Final    NO GROWTH 5 DAYS Performed at [facility], [place] [address], [place], [place] [address]    Report Status 04/08/2022 FINAL  Final  Culture, blood (routine x 2)     Status: None   Collection Time: 04/03/22  1:42 AM   Specimen: BLOOD LEFT FOREARM  Result Value Ref Range Status   Specimen Description BLOOD LEFT FOREARM  Final   Special Requests   Final    BOTTLES DRAWN AEROBIC AND ANAEROBIC Blood Culture adequate volume   Culture   Final    NO GROWTH 5 DAYS Performed at [facility], [place] [address], [place], [place] [address]    Report Status 04/08/2022 FINAL  Final  MRSA Next Gen by PCR, Nasal     Status: None   Collection Time: 04/03/22  5:43 AM   Specimen: Nasal Mucosa; Nasal Swab  Result Value Ref Range Status   MRSA by PCR Next Gen NOT DETECTED NOT DETECTED Final    Comment: (NOTE) The GeneXpert MRSA Assay (FDA approved for NASAL specimens only), is one component of a comprehensive MRSA colonization surveillance program. It is not intended to diagnose MRSA  infection nor to guide or monitor treatment for MRSA infections. Test performance is not FDA approved in patients less than 46 years old. Performed at [facility], [place] [address], [place], [place] [address]     Lab Basic Metabolic Panel: Recent Labs  Lab 04/06/22 1631 04/08/22 [id number] 04/08/22 1836 04/09/22 0932 04/10/22 0219 April 26, 2022 0818 April 26, 2022 1548  NA  --  153*  --  156* 151* 142 139  K  --  4.7  --  5.0 4.7 5.2* 6.1*  CL  --  118*  --  118* 112* 101 101  CO2  --  23  --  '25 26 27 25  '$ GLUCOSE  --  111*  --  153* 157* 158* 208*  BUN  --  142*  --  144* 146* 143* 149*  CREATININE  --  5.48*  --  5.60* 5.44* 4.81* 4.86*  CALCIUM  --  8.7*  --  8.4* 8.2* 8.2* 9.5  MG 2.5* 2.6* 2.6* 2.4  --  2.2  --   PHOS 8.5* 8.2* 8.3* 7.6* 7.2* 6.8*  --    Liver Function Tests: Recent Labs  Lab 04/06/22 0301 04/08/22 0826 04/10/22 0219 2022-04-26 0818 04-26-2022 1548  AST 13* 14*  --   --  40  ALT 14 15  --   --  29  ALKPHOS 58 63  --   --  47  BILITOT 0.6 0.7  --   --  0.5  PROT 5.8* 6.1*  --   --  4.5*  ALBUMIN 2.9* 3.1* 2.9* 2.5* 2.1*   No results for input(s): "LIPASE", "AMYLASE" in the last 168 hours. Recent Labs  Lab 04/08/22  1058  AMMONIA <10   CBC: Recent Labs  Lab 04/06/22 0301 04/08/22 0833 04/09/22 0932 2022-04-14 1053 Apr 14, 2022 1548  WBC 8.8 10.7* 8.7 13.4* 10.8*  HGB 7.9* 7.4* 7.3* 6.5* 6.5*  HCT 25.3* 24.9* 24.3* 20.4* 21.2*  MCV 100.4* 102.9* 102.5* 99.0 101.9*  PLT 138* 126* 101* 64* 50*   Cardiac Enzymes: No results for input(s): "CKTOTAL", "CKMB", "CKMBINDEX", "TROPONINI" in the last 168 hours. Sepsis Labs: Recent Labs  Lab 04/08/22 0833 04/09/22 0932 04/14/22 1053 14-Apr-2022 1548  WBC 10.7* 8.7 13.4* 10.8*    Procedures/Operations  ETT   Reanne Nellums L Wilian Kwong April 14, 2022, 5:01 PM

## 2022-05-10 NOTE — Progress Notes (Signed)
Apr 24, 2022   Case reviewed.  Events of today noted. Recurrent cardiac arrests in context of advanced CKD with hyperkalemia, probable GIB, severe psychiatric comorbidities.  ROSC has not been sustainable.  At this point I do not think more chest compressions and shocks will meaningfully prolong his life and will only cause more suffering.  Support DNR status.  Erskine Emery MD PCCM

## 2022-05-10 NOTE — Progress Notes (Signed)
PCCM:  New orders placed for 1 U PRBCs  Garner Nash, DO [facility] Apr 13, 2022 4:37 PM

## 2022-05-10 NOTE — Evaluation (Signed)
Occupational Therapy Evaluation Patient Details Name: Bradley Tran MRN: [id number] DOB: 24-Jul-1950 Today's Date: 2022-04-26   History of Present Illness 72 year old male presented to [facility] ED on 2/23 with acute respiratory failure with hypoxia after having been d/c'd from [facility] earlier in the day when he would not allow workup. COVID+ Pt required ETT 2/24-2/26.Marland Kitchen Pt with pertinent PMH COPD, bipolar, DMT2, hypothyroidism, PE in nov 2022 no longer on ac.   Clinical Impression   PTA, pt reports [place] on the streets" and was independent. Pt currently requiring Max A for ADLs and Mod A +2 for transfer to recliner. Pt presenting with decreased cognition, balance, strength, arousal, coordination, and activity tolerance. Pt with bleeding from cath placement; notified RN. Pt would benefit from further acute OT to facilitate safe dc. Recommend dc to SNF for further OT to optimize safety, independence with ADLs, and return to PLOF.      Recommendations for follow up therapy are one component of a multi-disciplinary discharge planning process, led by the attending physician.  Recommendations may be updated based on patient status, additional functional criteria and insurance authorization.   Follow Up Recommendations  Skilled nursing-short term rehab (<3 hours/day)     Assistance Recommended at Discharge    Patient can return home with the following Two people to help with walking and/or transfers;A lot of help with bathing/dressing/bathroom    Functional Status Assessment  Patient has had a recent decline in their functional status and demonstrates the ability to make significant improvements in function in a reasonable and predictable amount of time.  Equipment Recommendations  Other (comment) (Defer to next venue)    Recommendations for Other Services PT consult     Precautions / Restrictions Precautions Precautions: Fall Precaution Comments: 2L O2, NG tube, impulsive Restrictions Weight Bearing  Restrictions: No      Mobility Bed Mobility Overal bed mobility: Needs Assistance Bed Mobility: Supine to Sit     Supine to sit: Min assist     General bed mobility comments: minA to move LE to EOB and minA to trunk. pt able to scoot with some assist to reach EOB    Transfers Overall transfer level: Needs assistance Equipment used: 2 person hand held assist Transfers: Bed to chair/wheelchair/BSC, Sit to/from Stand Sit to Stand: Mod assist, +2 physical assistance Stand pivot transfers: Mod assist, +2 physical assistance         General transfer comment: pt with limited extension at hips and poor ability to advance either LE to assist with pivot. limited to stand-pivot as pt not advancing feet      Balance Overall balance assessment: Needs assistance Sitting-balance support: No upper extremity supported, Feet supported Sitting balance-Leahy Scale: Fair Sitting balance - Comments: no assist with static sitting   Standing balance support: Bilateral upper extremity supported Standing balance-Leahy Scale: Poor Standing balance comment: dependent on BUE support and trunk support                           ADL either performed or assessed with clinical judgement   ADL Overall ADL's : Needs assistance/impaired Eating/Feeding: Moderate assistance;Sitting   Grooming: Moderate assistance;Sitting   Upper Body Bathing: Maximal assistance;Sitting   Lower Body Bathing: Maximal assistance;Sit to/from stand   Upper Body Dressing : Maximal assistance;Sitting   Lower Body Dressing: Maximal assistance;Sit to/from stand   Toilet Transfer: Moderate assistance;+2 for physical assistance;+2 for safety/equipment;Stand-pivot (simulated to recliner)  Functional mobility during ADLs: Moderate assistance;+2 for physical assistance;+2 for safety/equipment (stand pivot only) General ADL Comments: Pt with decreased activity toelrance, balance, strength, cognition, and  arousal     Vision Baseline Vision/History: 1 Wears glasses (reading)       Perception     Praxis      Pertinent Vitals/Pain Pain Assessment Pain Assessment: Faces Faces Pain Scale: No hurt Breathing: normal Negative Vocalization: none Facial Expression: smiling or inexpressive Body Language: relaxed Consolability: no need to console PAINAD Score: 0 Pain Intervention(s): Monitored during session     Hand Dominance Right   Extremity/Trunk Assessment Upper Extremity Assessment Upper Extremity Assessment: RUE deficits/detail;LUE deficits/detail;Difficult to assess due to impaired cognition RUE Deficits / Details: Signifcant weakness. difficulty bringing spoon to mouth. Repeating to miss target and hitting his chin with spoon. RUE Coordination: decreased fine motor;decreased gross motor LUE Deficits / Details: Significant weakness. LUE Coordination: decreased fine motor;decreased gross motor   Lower Extremity Assessment Lower Extremity Assessment: Defer to PT evaluation   Cervical / Trunk Assessment Cervical / Trunk Assessment: Kyphotic   Communication Communication Communication: Other (comment) (mumbling and slightly dysarthric)   Cognition Arousal/Alertness: Lethargic, Suspect due to medications Behavior During Therapy: Impulsive Overall Cognitive Status: Difficult to assess                                       General Comments  SpO2 to 89% on 2L with exertion, recovered to mid-90s by end of session with pt seated in recliner. Blood coming from cath placement; notified RN.    Exercises     Shoulder Instructions      Home Living Family/patient expects to be discharged to:: Shelter/Homeless     Type of Home: Homeless                           Additional Comments: Pt reporting "I live on the streets."      Prior Functioning/Environment Prior Level of Function : Patient poor historian/Family not available                ADLs Comments: Difficulty answering questions due to lethargy        OT Problem List: Decreased strength;Decreased range of motion;Decreased activity tolerance;Impaired balance (sitting and/or standing);Decreased knowledge of use of DME or AE;Decreased knowledge of precautions      OT Treatment/Interventions: Self-care/ADL training;Therapeutic exercise;Energy conservation;DME and/or AE instruction;Therapeutic activities;Patient/family education    OT Goals(Current goals can be found in the care plan section) Acute Rehab OT Goals Patient Stated Goal: Agreeable to OOB OT Goal Formulation: With patient Time For Goal Achievement: 04/25/22 Potential to Achieve Goals: Good  OT Frequency: Min 2X/week    Co-evaluation PT/OT/SLP Co-Evaluation/Treatment: Yes Reason for Co-Treatment: To address functional/ADL transfers;For patient/therapist safety PT goals addressed during session: Mobility/safety with mobility;Balance;Strengthening/ROM OT goals addressed during session: ADL's and self-care      AM-PAC OT "6 Clicks" Daily Activity     Outcome Measure Help from another person eating meals?: A Lot Help from another person taking care of personal grooming?: A Lot Help from another person toileting, which includes using toliet, bedpan, or urinal?: A Lot Help from another person bathing (including washing, rinsing, drying)?: A Lot Help from another person to put on and taking off regular upper body clothing?: A Lot Help from another person to put on and taking off regular lower  body clothing?: A Lot 6 Click Score: 12   End of Session Equipment Utilized During Treatment: Oxygen Nurse Communication: Mobility status (bleeding from cath placement)  Activity Tolerance: Patient limited by pain Patient left: in chair;with call bell/phone within reach;with chair alarm set  OT Visit Diagnosis: Unsteadiness on feet (R26.81);Other abnormalities of gait and mobility (R26.89);Muscle weakness  (generalized) (M62.81)                Time: [id number] OT Time Calculation (min): 39 min Charges:  OT General Charges $OT Visit: 1 Visit OT Evaluation $OT Eval Moderate Complexity: 1 Mod  Matti Killingsworth MSOT, OTR/L Acute Rehab Office: [place] 05-07-22, 1:18 PM

## 2022-05-10 NOTE — Progress Notes (Signed)
1459: This SWOT RN called to bedside to check PRBC blood. Patient found to be sitting in large stool, brown sputum on gown and bed. Patient resting in bed with eyes closed. Blood started to infuse at signed off and documented time 1459 at 164m/hr. Blood noted to be primed but approximately 6 inches away from patient in the tubing. Patient drowsy but able to [facility] Followed directions to turn in bed and patient cleaned by two staff members this RN included. Patient turned back supine and this RN left room to gather supplies. Primary RN called this RN back to room to get vacuum and suction equipment as patient was spitting up brown milky liquid. Suction equipment obtained and this RN entered room. Patient covered in brown milky liquid, heart rate in 120's ST, eyes closed with spontaneous breathing.  Patient sat up in bed by primary RN and suction equipment connected to wall and yankeur by this RN. Patient then appeared grey, not responding to sternal rub and brown liquid continuously pouring from patient's mouth (being suctioned with yankeur) Blood pressure attempting to be taken during this time, resulted in the [id number]. Pulse check without pulse and code called. Primary RN disconnected blood from patient's single IV and then Primary RN left room and went to get assistance while this RN continued compressions. Compressions ongoing for a few minutes prior to arrival of assistance. Patient connected to Zoll monitor and back board placed. Suctioning continued with yankeur (brown liquid fluid coming out of patient mouth) and, after switching compressors, this RN bagged patient until respiratory arrival. See code documentation.

## 2022-05-10 NOTE — Progress Notes (Signed)
   04-14-22 1500  Spiritual Encounters  Type of Visit Initial  Care provided to: Patient  Conversation partners present during encounter Nurse  Referral source Code page  Reason for visit Code  OnCall Visit Yes   Ch responded to code blue. No family at bedside. Ch provided emotional support to floor Retail banker. No follow-up needed at this time.

## 2022-05-10 NOTE — Progress Notes (Signed)
Physical Therapy Treatment Patient Details Name: Bradley Tran MRN: [id number] DOB: 19-Jun-1950 Today's Date: Apr 22, 2022   History of Present Illness Patient is a 72 year old male presented to [facility] ED on 2/23 with acute respiratory failure with hypoxia after having been d/c'd from [facility] earlier in the day when he would not allow workup. COVID+ Pt required ETT 2/24-2/26.Marland Kitchen Pt with pertinent PMH COPD, bipolar, DMT2, hypothyroidism, PE in nov 2022 no longer on ac.    PT Comments    The pt was agreeable to session and eager to get out of bed. Continues to present with significant deficits in LE strength, power, endurance, and balance which require min-modA to manage bed mobility and modA of 2 for OOB transfers. The pt was limited to pivotal transfer at this time due to poor advancement of LE when in standing, will continue to benefit from maximal mobility to progress independence with OOB mobility and activity tolerance.     Recommendations for follow up therapy are one component of a multi-disciplinary discharge planning process, led by the attending physician.  Recommendations may be updated based on patient status, additional functional criteria and insurance authorization.  Follow Up Recommendations  Skilled nursing-short term rehab (<3 hours/day) Can patient physically be transported by private vehicle: No   Assistance Recommended at Discharge Frequent or constant Supervision/Assistance  Patient can return home with the following A lot of help with walking and/or transfers;A lot of help with bathing/dressing/bathroom;Assistance with cooking/housework;Direct supervision/assist for medications management;Direct supervision/assist for financial management;Assist for transportation;Help with stairs or ramp for entrance   Equipment Recommendations  Other (comment) (defer to post acute)    Recommendations for Other Services       Precautions / Restrictions Precautions Precautions: Fall Precaution  Comments: 2L O2, NG tube, impulsive Restrictions Weight Bearing Restrictions: No     Mobility  Bed Mobility Overal bed mobility: Needs Assistance Bed Mobility: Supine to Sit     Supine to sit: Min assist     General bed mobility comments: minA to move LE to EOB and minA to trunk. pt able to scoot with some assist to reach EOB    Transfers Overall transfer level: Needs assistance Equipment used: 2 person hand held assist Transfers: Bed to chair/wheelchair/BSC, Sit to/from Stand Sit to Stand: Mod assist, +2 physical assistance Stand pivot transfers: Mod assist, +2 physical assistance         General transfer comment: pt with limited extension at hips and poor ability to advance either LE to assist with pivot. limited to stand-pivot as pt not advancing feet    Ambulation/Gait               General Gait Details: pt unable        Balance Overall balance assessment: Needs assistance Sitting-balance support: No upper extremity supported, Feet supported Sitting balance-Leahy Scale: Fair Sitting balance - Comments: no assist with static sitting   Standing balance support: Bilateral upper extremity supported Standing balance-Leahy Scale: Poor Standing balance comment: dependent on BUE support and trunk support                            Cognition Arousal/Alertness: Lethargic, Suspect due to medications Behavior During Therapy: Impulsive Overall Cognitive Status: Difficult to assess  General Comments General comments (skin integrity, edema, etc.): SpO2 to 89% on 2L with exertion, recovered to mid-90s by end of session with pt seated in reclinet      Pertinent Vitals/Pain Pain Assessment Pain Assessment: Faces Faces Pain Scale: No hurt Pain Intervention(s): Monitored during session    Home Living Family/patient expects to be discharged to:: Shelter/Homeless                    Additional Comments: Pt reporting "I live on the streets."    Prior Function            PT Goals (current goals can now be found in the care plan section) Acute Rehab PT Goals Patient Stated Goal: none stated PT Goal Formulation: Patient unable to participate in goal setting Time For Goal Achievement: 04/20/22 Potential to Achieve Goals: Good Progress towards PT goals: Progressing toward goals    Frequency    Min 3X/week      PT Plan Current plan remains appropriate    Co-evaluation PT/OT/SLP Co-Evaluation/Treatment: Yes Reason for Co-Treatment: To address functional/ADL transfers;For patient/therapist safety PT goals addressed during session: Mobility/safety with mobility;Balance;Strengthening/ROM OT goals addressed during session: ADL's and self-care      AM-PAC PT "6 Clicks" Mobility   Outcome Measure  Help needed turning from your back to your side while in a flat bed without using bedrails?: A Little Help needed moving from lying on your back to sitting on the side of a flat bed without using bedrails?: A Lot Help needed moving to and from a bed to a chair (including a wheelchair)?: Total Help needed standing up from a chair using your arms (e.g., wheelchair or bedside chair)?: Total Help needed to walk in hospital room?: Total Help needed climbing 3-5 steps with a railing? : Total 6 Click Score: 9    End of Session Equipment Utilized During Treatment: Gait belt;Oxygen Activity Tolerance: Patient tolerated treatment well Patient left: in chair;with call bell/phone within reach;with chair alarm set Nurse Communication: Mobility status PT Visit Diagnosis: Unsteadiness on feet (R26.81);Other abnormalities of gait and mobility (R26.89);Muscle weakness (generalized) (M62.81);Difficulty in walking, not elsewhere classified (R26.2)     Time: [id number] PT Time Calculation (min) (ACUTE ONLY): 39 min  Charges:  $Therapeutic Exercise: 8-22 mins $Therapeutic  Activity: 8-22 mins                     West Carbo, PT, DPT   Acute Rehabilitation Department Office [place] Communication Preferred   Sandra Cockayne 05/08/2022, 11:35 AM

## 2022-05-10 DEATH — deceased
